# Patient Record
Sex: Male | Born: 1965
Health system: Southern US, Community
[De-identification: ages and names within clinical notes are randomized; demographics above are authoritative.]

## PROBLEM LIST (undated history)

## (undated) DIAGNOSIS — M109 Gout, unspecified: Secondary | ICD-10-CM

## (undated) DIAGNOSIS — E119 Type 2 diabetes mellitus without complications: Secondary | ICD-10-CM

## (undated) DIAGNOSIS — E785 Hyperlipidemia, unspecified: Secondary | ICD-10-CM

## (undated) DIAGNOSIS — I639 Cerebral infarction, unspecified: Secondary | ICD-10-CM

## (undated) HISTORY — DX: Type 2 diabetes mellitus without complications: E11.9

---

## 2011-05-24 ENCOUNTER — Ambulatory Visit: Payer: Self-pay | Admitting: Family Medicine

## 2013-05-29 ENCOUNTER — Emergency Department: Payer: Self-pay | Admitting: Emergency Medicine

## 2015-04-13 DIAGNOSIS — M109 Gout, unspecified: Secondary | ICD-10-CM

## 2015-04-13 HISTORY — DX: Gout, unspecified: M10.9

## 2015-04-25 DIAGNOSIS — I639 Cerebral infarction, unspecified: Secondary | ICD-10-CM

## 2015-04-25 HISTORY — DX: Cerebral infarction, unspecified: I63.9

## 2015-04-26 ENCOUNTER — Emergency Department (HOSPITAL_COMMUNITY): Payer: Medicaid Other

## 2015-04-26 ENCOUNTER — Inpatient Hospital Stay (HOSPITAL_COMMUNITY): Payer: Medicaid Other

## 2015-04-26 ENCOUNTER — Encounter (HOSPITAL_COMMUNITY): Payer: Self-pay

## 2015-04-26 ENCOUNTER — Inpatient Hospital Stay (HOSPITAL_COMMUNITY)
Admission: EM | Admit: 2015-04-26 | Discharge: 2015-05-01 | DRG: 041 | Disposition: A | Discharge status: Rehabilitation Facility | Payer: Medicaid Other | Attending: Family Medicine | Admitting: Family Medicine

## 2015-04-26 DIAGNOSIS — E785 Hyperlipidemia, unspecified: Secondary | ICD-10-CM | POA: Diagnosis present

## 2015-04-26 DIAGNOSIS — M109 Gout, unspecified: Secondary | ICD-10-CM | POA: Diagnosis present

## 2015-04-26 DIAGNOSIS — I69398 Other sequelae of cerebral infarction: Secondary | ICD-10-CM | POA: Insufficient documentation

## 2015-04-26 DIAGNOSIS — I639 Cerebral infarction, unspecified: Secondary | ICD-10-CM | POA: Diagnosis present

## 2015-04-26 DIAGNOSIS — I63312 Cerebral infarction due to thrombosis of left middle cerebral artery: Secondary | ICD-10-CM | POA: Diagnosis not present

## 2015-04-26 DIAGNOSIS — I69991 Dysphagia following unspecified cerebrovascular disease: Secondary | ICD-10-CM | POA: Diagnosis not present

## 2015-04-26 DIAGNOSIS — I63512 Cerebral infarction due to unspecified occlusion or stenosis of left middle cerebral artery: Principal | ICD-10-CM | POA: Diagnosis present

## 2015-04-26 DIAGNOSIS — Z720 Tobacco use: Secondary | ICD-10-CM | POA: Diagnosis not present

## 2015-04-26 DIAGNOSIS — I651 Occlusion and stenosis of basilar artery: Secondary | ICD-10-CM | POA: Diagnosis present

## 2015-04-26 DIAGNOSIS — R4701 Aphasia: Secondary | ICD-10-CM | POA: Diagnosis present

## 2015-04-26 DIAGNOSIS — IMO0002 Reserved for concepts with insufficient information to code with codable children: Secondary | ICD-10-CM | POA: Insufficient documentation

## 2015-04-26 DIAGNOSIS — G8191 Hemiplegia, unspecified affecting right dominant side: Secondary | ICD-10-CM | POA: Diagnosis present

## 2015-04-26 DIAGNOSIS — F1020 Alcohol dependence, uncomplicated: Secondary | ICD-10-CM | POA: Insufficient documentation

## 2015-04-26 DIAGNOSIS — F17200 Nicotine dependence, unspecified, uncomplicated: Secondary | ICD-10-CM | POA: Diagnosis present

## 2015-04-26 DIAGNOSIS — I69320 Aphasia following cerebral infarction: Secondary | ICD-10-CM | POA: Insufficient documentation

## 2015-04-26 DIAGNOSIS — M1A9XX0 Chronic gout, unspecified, without tophus (tophi): Secondary | ICD-10-CM | POA: Insufficient documentation

## 2015-04-26 DIAGNOSIS — R4182 Altered mental status, unspecified: Secondary | ICD-10-CM | POA: Diagnosis not present

## 2015-04-26 DIAGNOSIS — F1090 Alcohol use, unspecified, uncomplicated: Secondary | ICD-10-CM | POA: Insufficient documentation

## 2015-04-26 DIAGNOSIS — F1010 Alcohol abuse, uncomplicated: Secondary | ICD-10-CM | POA: Diagnosis present

## 2015-04-26 DIAGNOSIS — I69920 Aphasia following unspecified cerebrovascular disease: Secondary | ICD-10-CM | POA: Diagnosis not present

## 2015-04-26 DIAGNOSIS — I63412 Cerebral infarction due to embolism of left middle cerebral artery: Secondary | ICD-10-CM | POA: Diagnosis not present

## 2015-04-26 DIAGNOSIS — I10 Essential (primary) hypertension: Secondary | ICD-10-CM | POA: Diagnosis not present

## 2015-04-26 DIAGNOSIS — R269 Unspecified abnormalities of gait and mobility: Secondary | ICD-10-CM

## 2015-04-26 DIAGNOSIS — I69391 Dysphagia following cerebral infarction: Secondary | ICD-10-CM | POA: Insufficient documentation

## 2015-04-26 HISTORY — DX: Gout, unspecified: M10.9

## 2015-04-26 HISTORY — DX: Hyperlipidemia, unspecified: E78.5

## 2015-04-26 LAB — COMPREHENSIVE METABOLIC PANEL
ALBUMIN: 3.9 g/dL (ref 3.5–5.0)
ALK PHOS: 59 U/L (ref 38–126)
ALT: 25 U/L (ref 17–63)
AST: 27 U/L (ref 15–41)
Anion gap: 12 (ref 5–15)
BILIRUBIN TOTAL: 1.2 mg/dL (ref 0.3–1.2)
BUN: 9 mg/dL (ref 6–20)
CALCIUM: 9.4 mg/dL (ref 8.9–10.3)
CO2: 26 mmol/L (ref 22–32)
Chloride: 102 mmol/L (ref 101–111)
Creatinine, Ser: 0.75 mg/dL (ref 0.61–1.24)
GFR calc Af Amer: >60 mL/min (ref >60)
GFR calc non Af Amer: >60 mL/min (ref >60)
GLUCOSE: 98 mg/dL (ref 65–99)
Potassium: 4.9 mmol/L (ref 3.5–5.1)
SODIUM: 140 mmol/L (ref 135–145)
TOTAL PROTEIN: 6.7 g/dL (ref 6.5–8.1)

## 2015-04-26 LAB — PROTIME-INR
INR: 1.13 (ref 0.00–1.49)
Prothrombin Time: 14.7 seconds (ref 11.6–15.2)

## 2015-04-26 LAB — MRSA PCR SCREENING: MRSA BY PCR: NEGATIVE

## 2015-04-26 LAB — MAGNESIUM: Magnesium: 2.6 mg/dL — ABNORMAL HIGH (ref 1.7–2.4)

## 2015-04-26 LAB — DIFFERENTIAL
BASOS ABS: 0 10*3/uL (ref 0.0–0.1)
Basophils Relative: 0 %
Eosinophils Absolute: 0.1 10*3/uL (ref 0.0–0.7)
Eosinophils Relative: 1 %
LYMPHS ABS: 1.7 10*3/uL (ref 0.7–4.0)
LYMPHS PCT: 23 %
Monocytes Absolute: 1 10*3/uL (ref 0.1–1.0)
Monocytes Relative: 13 %
NEUTROS ABS: 4.7 10*3/uL (ref 1.7–7.7)
NEUTROS PCT: 63 %

## 2015-04-26 LAB — I-STAT CHEM 8, ED
BUN: 12 mg/dL (ref 6–20)
CREATININE: 0.7 mg/dL (ref 0.61–1.24)
Calcium, Ion: 1.13 mmol/L (ref 1.12–1.23)
Chloride: 101 mmol/L (ref 101–111)
Glucose, Bld: 91 mg/dL (ref 65–99)
HEMATOCRIT: 51 % (ref 39.0–52.0)
HEMOGLOBIN: 17.3 g/dL — AB (ref 13.0–17.0)
POTASSIUM: 5.4 mmol/L — AB (ref 3.5–5.1)
SODIUM: 138 mmol/L (ref 135–145)
TCO2: 31 mmol/L (ref 0–100)

## 2015-04-26 LAB — RAPID URINE DRUG SCREEN, HOSP PERFORMED
AMPHETAMINES: NOT DETECTED
Barbiturates: NOT DETECTED
Benzodiazepines: NOT DETECTED
COCAINE: NOT DETECTED
OPIATES: NOT DETECTED
Tetrahydrocannabinol: NOT DETECTED

## 2015-04-26 LAB — URINALYSIS, ROUTINE W REFLEX MICROSCOPIC
Bilirubin Urine: NEGATIVE
GLUCOSE, UA: NEGATIVE mg/dL
Hgb urine dipstick: NEGATIVE
Ketones, ur: 15 mg/dL — AB
LEUKOCYTES UA: NEGATIVE
NITRITE: NEGATIVE
PH: 7 (ref 5.0–8.0)
Protein, ur: NEGATIVE mg/dL
SPECIFIC GRAVITY, URINE: 1.007 (ref 1.005–1.030)

## 2015-04-26 LAB — CBC
HCT: 46.5 % (ref 39.0–52.0)
HEMOGLOBIN: 16.4 g/dL (ref 13.0–17.0)
MCH: 33 pg (ref 26.0–34.0)
MCHC: 35.3 g/dL (ref 30.0–36.0)
MCV: 93.6 fL (ref 78.0–100.0)
PLATELETS: 193 10*3/uL (ref 150–400)
RBC: 4.97 MIL/uL (ref 4.22–5.81)
RDW: 11.8 % (ref 11.5–15.5)
WBC: 7.5 10*3/uL (ref 4.0–10.5)

## 2015-04-26 LAB — ETHANOL

## 2015-04-26 LAB — I-STAT TROPONIN, ED: Troponin i, poc: 0 ng/mL (ref 0.00–0.08)

## 2015-04-26 LAB — APTT: APTT: 27 s (ref 24–37)

## 2015-04-26 MED ORDER — LORAZEPAM 2 MG/ML IJ SOLN: 2.0000 mg | INTRAMUSCULAR | Status: DC | PRN

## 2015-04-26 MED ORDER — NICOTINE 14 MG/24HR TD PT24
14.0000 mg | MEDICATED_PATCH | Freq: Every day | TRANSDERMAL | Status: DC
  Administered 2015-04-27 – 2015-04-28 (×2): 14 mg via TRANSDERMAL
  Filled 2015-04-26 (×3): qty 1

## 2015-04-26 MED ORDER — M.V.I. ADULT IV INJ
INJECTION | Freq: Once | INTRAVENOUS | Status: AC
  Administered 2015-04-26: 20:00:00 via INTRAVENOUS

## 2015-04-26 MED ORDER — ENOXAPARIN SODIUM 40 MG/0.4ML ~~LOC~~ SOLN: 40.0000 mg | SUBCUTANEOUS | Status: DC

## 2015-04-26 MED ORDER — IPRATROPIUM-ALBUTEROL 0.5-2.5 (3) MG/3ML IN SOLN: 3.0000 mL | Freq: Four times a day (QID) | RESPIRATORY_TRACT | Status: DC | PRN

## 2015-04-26 MED ORDER — SODIUM CHLORIDE 0.9 % IJ SOLN
3.0000 mL | Freq: Two times a day (BID) | INTRAMUSCULAR | Status: DC
  Administered 2015-04-26 – 2015-04-30 (×7): 3 mL via INTRAVENOUS

## 2015-04-26 MED ORDER — ASPIRIN 300 MG RE SUPP: 300.0000 mg | Freq: Every day | RECTAL | Status: DC

## 2015-04-26 MED ORDER — STROKE: EARLY STAGES OF RECOVERY BOOK
Freq: Once | Status: AC
  Administered 2015-04-27: 02:00:00
  Filled 2015-04-26: qty 1

## 2015-04-26 MED ORDER — IOHEXOL 350 MG/ML SOLN
80.0000 mL | Freq: Once | INTRAVENOUS | Status: AC | PRN
  Administered 2015-04-26: 80 mL via INTRAVENOUS

## 2015-04-26 MED ORDER — THIAMINE HCL 100 MG/ML IJ SOLN
Freq: Once | INTRAVENOUS | Status: DC
  Filled 2015-04-26: qty 1000

## 2015-04-26 MED ORDER — ASPIRIN 300 MG RE SUPP
300.0000 mg | Freq: Every day | RECTAL | Status: DC
  Administered 2015-04-26: 300 mg via RECTAL
  Filled 2015-04-26: qty 1

## 2015-04-26 MED ORDER — SENNOSIDES-DOCUSATE SODIUM 8.6-50 MG PO TABS: 1.0000 | ORAL_TABLET | Freq: Every evening | ORAL | Status: DC | PRN

## 2015-04-26 MED ORDER — ENOXAPARIN SODIUM 40 MG/0.4ML ~~LOC~~ SOLN
40.0000 mg | SUBCUTANEOUS | Status: DC
  Administered 2015-04-26 – 2015-04-30 (×5): 40 mg via SUBCUTANEOUS
  Filled 2015-04-26 (×5): qty 0.4

## 2015-04-26 MED ORDER — DEXTROSE-NACL 5-0.9 % IV SOLN
INTRAVENOUS | Status: DC
  Administered 2015-04-27: 01:00:00 via INTRAVENOUS

## 2015-04-26 MED ORDER — THIAMINE HCL 100 MG/ML IJ SOLN
Freq: Once | INTRAVENOUS | Status: DC
  Administered 2015-04-26: 18:00:00 via INTRAVENOUS
  Filled 2015-04-26 (×2): qty 1000

## 2015-04-26 NOTE — H&P (Signed)
[facility] History and Physical Service Pager: [phone]  Patient name: Gary Barker Medical record number: [id number] Date of birth: 07/08/1965 Age: 50 y.o. Gender: male  Primary Care Provider: No primary care provider on file. Consultants: neurology Code Status: full  Chief Complaint: aphasia and right sided weakness  Assessment and Plan: Gary Barker is a 50 y.o. male presenting with ischemic stroke. PMH is significant for heavy drinking, heavy smoking and questionable hypertension.  CVA in LMCA territory: Patient with receptive and expressive aphasia consistent with his CT finding. He was last seen normal at 17:30pm last night, 1/13. He is outside tpa window.  Exam is limited as patient had difficulty understanding and follow directions. Hand grips 4/5 on the right and 5/5 on the left. He can raise both legs. Knee reflex 2+. Babinski negative. No apparent facial drooping.  CT head with CT head with acute infarction involving the left insular ribbon and external capsule, and potential thrombus within the left middle cerebral artery. CTA suspicious for multifocal areas of nonhemorrhagic left MCA territory infarction. No sign of infection. UDS negative. EtOH level normal. EKG read as atrial flutter but NSR on further evaluation. Unclear source of stroke at this time. Some concern for multiple emboli breaking from a larger clot source given distribution of infarcts. He does have an extensive smoking history with limited medical follow up to evaluate for potential chronic conditions.  -Admit to step-down, Attending Dr. Jennette KettleNeal -Appreciate neuro recs. F/u further recs -MRI brain/ Echo -Allow permissive hypertension, BP to to 220/120. Will normalize BP over 5-7 days -Risk stratification labs (lipid panel, A1c, TSH) -SLP/PT/OT eval -NPO pending bed side swallow eval.   -Full dose ASA and Atorvastatin 40 mg when able to swallow safely, ASA per rectum for  now -Nurovascular check every 2 hours  Questionable H/o Hypertension: not clear on this. He sees Dr Zara Che[place]Larry [place]. Doesn't seem he is on any medication -allow permissive hypertension for now -Will call Dr. Gerilyn PilgrimSykes office on Monday  H/o asthma?: he is on combivent. Wife says he hasn't used the medication in awhile.  -Albuterol as needed.  Alcohol use disorder: history of heavy drinking per family members -CIWA -Recommend EtOH rehab on discharge  Tobacco use disorder: unclear how much he smokes but per family he ' is always outside smoking' -Nicotine patch 14 mg qD  Recent treatment for hand pain- Recently put on a steroid taper for hand pain [facility]. This was started on 1/7 to continue for 10 days, on 1/16. Patient and family are unable to say what his symptoms were or what he was diagnosed with. Exam is negative for rash or lesions on bilateral hands.  - Will hold steroids for now as he is NPO and has early completed steroid course - will call clinic where he was seen to inquire regarding further history  FEN/GI: -NPO pending bedside swallow test -mIVF, banana bag then D5NS -Protonix  Prophylaxis: lovenox  Disposition: stepdown pending neuro improvement and work up  History of Present Illness:  Gary Barker is a 50 y.o. male presenting with stroke. Patient is not able to provide history. History was gathered from his wife and step son.  Patient was in his usual state of health until yesterday 5:30 pm. He was cooking food. About 8 am he was noted crawling up stairs. Initially, they thought he was drunk. He usually does drink heavily. About 9 pm his wife noticed him sitting  on the floor next to their bed holding to the edge of his bed. He was able to pull himself in to the bed. His wife also  noticed right sided facial drooping. He wasn't talking well which was not normal for him according to his wife.  Patient walk up this morning. He is still  not able to talk. He was dragging his right leg when he tried to go to bathroom. Finally, they decided to call EMS.  Heavy use of EtOH use tobacco. History of marijuana use in the past. Patient was seen by Neuro while in ED.  In ED: BP 171/119. CBC and CMP within normal range, UDS negative, EtoH < 5, CT head with acute infarction involving the left insular ribbon and external capsule, and potential thrombus within the left middle cerebral artery. CTA suspicious for multifocal areas of nonhemorrhagic left MCA territory infarction.   Review Of Systems: Per HPI Family and patient deny recent illness, fever, chest pain, SOB, headache, otherwise the remainder of the systems were negative.  Patient Active Problem List   Diagnosis Date Noted  . CVA (cerebral infarction) 04/26/2015  . Cerebral infarction due to unspecified mechanism     Past Medical History: Treated for HTN approximately 7 years ago, but has not had treatment since  Past Surgical History: History reviewed. No pertinent past surgical history.  Social History: Social History  Substance Use Topics  . Smoking status: Never Smoker   . Smokeless tobacco: None  . Alcohol Use: Yes   Additional social history: history of marijuana use in the past. Please also refer to relevant sections of EMR.  Family History: History reviewed. No pertinent family history. Not able to obtain this at this time  Allergies and Medications: No Known Allergies  Objective: BP 178/116 mmHg  Pulse 80  Temp(Src) 98.7 F (37.1 C) (Oral)  Resp 18  SpO2 99% Exam: Gen: lying in bed, NAD. Eyes: pupils equal, round and reactive to light Oropharynx: clear, moist  Neck: supple, no LAD CV: regular rate and rythm. S1 & S2 audible, no murmurs. Resp: normal work of breathing, clear to auscultation bilaterally. GI: bowel sounds normal, no tenderness to palpation Skin: no lesions, normal bilateral hands negative for rash or bony abnormalities MSK:  normal bulk, symmetric Neuro: limited by patients difficulty to understand and follow directions. Aphasic, says yes to every questions including " Are you a Martian?",  no noticeable facial drooping, PERRL, hand grips 4/5 in right and 5/5 in left, moves both legs symmetrically, made tight fist with his right hand when asked to raise his left leg, knee reflex 2+ bilaterally, babinski negative bilaterally.    Labs and Imaging: CBC BMET   Recent Labs Lab 04/26/15 1203 04/26/15 1216  WBC 7.5  --   HGB 16.4 17.3*  HCT 46.5 51.0  PLT 193  --     Recent Labs Lab 04/26/15 1203 04/26/15 1216  NA 140 138  K 4.9 5.4*  CL 102 101  CO2 26  --   BUN 9 12  CREATININE 0.75 0.70  GLUCOSE 98 91  CALCIUM 9.4  --      Ct Head Wo Contrast  04/26/2015  1. Acute infarction involving the LEFT insular ribbon and external capsule. 2. Potential thrombus within the LEFT middle cerebral artery. Findings conveyed to: ZAMMIT, JOSEPHon 04/26/2015  at13:06. Electronically Signed   By: Genevive Bi M.D.   On: 04/26/2015 13:08   Ct Angio Neck W/cm &/or Wo/cm  04/26/2015  Technically  poor quality CTA head neck. The study is sufficiently diagnostic to exclude a flow reducing extracranial stenosis or intracranial large vessel occlusion. Multifocal areas of nonhemorrhagic LEFT MCA territory infarction are suspected. MRI brain and intracranial MRA is pending. Electronically Signed   By: Elsie Stain M.D.   On: 04/26/2015 15:32    Almon Hercules, MD 04/26/2015, 4:11 PM PGY-1, [facility] FPTS Intern pager: [phone], text pages welcome   I have seen and examined the patient. I have read and agree with the above note. My changes are noted in blue.  Tiffancy Moger A. Kennon Rounds MD, MS Family Medicine Resident PGY-2 Pager [phone]

## 2015-04-26 NOTE — ED Provider Notes (Signed)
CSN: [id number]     Arrival date & time 04/26/15  1138 History   First MD Initiated Contact with Patient 04/26/15 1140     Chief Complaint  Patient presents with  . Cerebrovascular Accident     (Consider location/radiation/quality/duration/timing/severity/associated sxs/prior Treatment) HPI  Gary Barker is a(n) 50 y.o. male who presents to the emergency department for weakness and a fascia. His last seen normal was 7 PM last night and he is out of the window for TPA administration. He has a past medical history of smoking, heavy alcohol use. His wife denies history of high blood pressure. Last night around 11 PM she noticed him crawling on the floor to the bed. She asked if he was okay and he said yes, and crawled into bed. Patient's wife fell asleep. When he awoke this morning she noticed he was dragging his right leg. He tried to drink some milk but then began having a facial droop and drooling. She also said that he cannot answer questions normally and was having difficulty saying words. He has no other significant past medical history.   History reviewed. No pertinent past medical history. History reviewed. No pertinent past surgical history. History reviewed. No pertinent family history. Social History  Substance Use Topics  . Smoking status: Never Smoker   . Smokeless tobacco: None  . Alcohol Use: Yes    Review of Systems  Unable to perform ROS: Mental status change      Allergies  Review of patient's allergies indicates no known allergies.  Home Medications   Prior to Admission medications   Not on File   BP 171/119 mmHg  Pulse 84  Temp(Src) 98.3 F (36.8 C) (Oral)  Resp 22  SpO2 99% Physical Exam  Constitutional: He appears well-developed and well-nourished. No distress.  HENT:  Head: Normocephalic and atraumatic.  Eyes: Conjunctivae are normal. No scleral icterus.  Neck: Normal range of motion. Neck supple.  Cardiovascular: Normal rate, regular rhythm and  normal heart sounds.   Pulmonary/Chest: Effort normal and breath sounds normal. No respiratory distress.  Abdominal: Soft. There is no tenderness.  Musculoskeletal: He exhibits no edema.  Neurological: He is alert. GCS eye subscore is 4. GCS verbal subscore is 5. GCS motor subscore is 6.  Patient with what appears to be a left sided gaze preference. The right arm is drawn upward. He has an expressive aphasia and also seems to have a receptive aphasia, unable to follow commands correctly. Unable to assess the majority of the neuro exam due to his inability to affectively participate. There is no noticeable facial droop. PERRL  Skin: Skin is warm and dry. He is not diaphoretic.  Psychiatric: His behavior is normal.  Nursing note and vitals reviewed.   ED Course  Procedures (including critical care time) Labs Review Labs Reviewed  ETHANOL  PROTIME-INR  APTT  CBC  DIFFERENTIAL  COMPREHENSIVE METABOLIC PANEL  URINE RAPID DRUG SCREEN, HOSP PERFORMED  URINALYSIS, ROUTINE W REFLEX MICROSCOPIC (NOT AT [facility])  MAGNESIUM  I-STAT CHEM 8, ED  I-STAT TROPOININ, ED    Imaging Review No results found. I have personally reviewed and evaluated these images and lab results as part of my medical decision-making.   EKG Interpretation   Date/Time:  Saturday April 26 2015 11:41:11 EST Ventricular Rate:  77 PR Interval:    QRS Duration: 89 QT Interval:  355 QTC Calculation: 402 R Axis:   55 Text Interpretation:  Atrial flutter with predominant 4:1 AV block  Confirmed by  ZAMMIT  MD, Jomarie LongsJOSEPH [phone] on 04/26/2015 11:55:26 AM      MDM   Final diagnoses:  Cerebral infarction due to unspecified mechanism    11:45 AM Patient with significant neurologic deficits. Very likely patient has had a stroke. I have ordered a banana bag on the patient as I have suspicion for high alcohol use, and question if there is some component of Korsakoff's/Wernicke's encephalopathy. The patient is stable and  protecting his airway. He has a GCS of 15. I have spoken wit Dr. Amada JupiterKirkpatrick who will consult on the patient.   Patient CT scan positive for acute left MCA infarct. I spoken with Dr. Amada JupiterKirkpatrick, who is seeing the patient. He is put in an order for CT angiogram. MRI discontinued. The patient will be admitted by family medicine. He will move to the floor after CTA is completed. He is stable throughout visit here.   pateitne with    Arthor CaptainAbigail Esiquio Boesen, PA-C 04/26/15 1800  Bethann BerkshireJoseph Zammit, MD 04/29/15 [phone]

## 2015-04-26 NOTE — ED Notes (Signed)
Pt back from CT, admitting MD at bedside.

## 2015-04-26 NOTE — Consult Note (Addendum)
Neurology Consultation Reason for Consult: Right-sided weakness Referring Physician: Clelia SchaumannZammit, J  CC: Right-sided weakness  History is obtained from: Family  HPI: Gary LayerJuan Barker is a 50 y.o. male who was last seen in his normal state around 5 or 6 PM last night. He was then seen to be abnormal around 9 PM. His wife works third shift, this morning when his wife saw him around 7 AM, he was asking inappropriate questions and dragging his right foot. She therefore brought him into the emergency room where CT head was performed which shows patchy left MCA territory infarct.   Of note, he states that he gets inflamed joints from time to time, and recently had inflamed bilateral thumb joints and is on prednisone for what I presume is gout.  LKW: 9 PM tpa given?: no, out of window    ROS: A 14 point ROS was performed and is negative except as noted in the HPI.   Past medical history: I suspect Gout  Family medical history: No hx stroke   Social History:  reports that he has never smoked. He does not have any smokeless tobacco history on file. He reports that he drinks alcohol. He reports that he does not use illicit drugs.   Exam: Current vital signs: BP 171/119 mmHg  Pulse 84  Temp(Src) 98.3 F (36.8 C) (Oral)  Resp 22  SpO2 99% Vital signs in last 24 hours: Temp:  [98.3 F (36.8 C)] 98.3 F (36.8 C) (01/14 1140) Pulse Rate:  [84] 84 (01/14 1140) Resp:  [22] 22 (01/14 1140) BP: (171)/(119) 171/119 mmHg (01/14 1140) SpO2:  [99 %] 99 % (01/14 1140)   Physical Exam  Constitutional: Appears well-developed and well-nourished.  Psych: Affect appropriate to situation Eyes: No scleral injection HENT: No OP obstrucion Head: Normocephalic.  Cardiovascular: Normal rate and regular rhythm.  Respiratory: Effort normal and breath sounds normal to anterior ascultation GI: Soft.  No distension. There is no tenderness.  Skin: WDI  Neuro: Mental Status: Patient is awake, alert,  oriented to person, place, month, year, and situation. Patient is able to give a clear and coherent history. He has a moderate - severe aphasia. He is able to answer some questions with gestures and fragments, though not always appropriately. Follows commands incompletely(e.g. Shows hand when asked to show thumb) Cranial Nerves: II:R hemianopia.  Pupils are equal, round, and reactive to light.   III,IV, VI: EOMI without ptosis or diploplia.  V: Facial sensation is symmetric to temperature VII: Facial movement is symmetric.  VIII: hearing is intact to voice X: Uvula elevates symmetrically XI: Shoulder shrug is symmetric. XII: tongue is midline without atrophy or fasciculations.  Motor: Tone is normal. Bulk is normal. He does not cooperate fully, but to the extent that he did cooperate and was not able to appreciate any weakness, he does not drift. Sensory: Sensation is symmetric to pinprick Cerebellar: No clear ataxia   I have reviewed labs in epic and the results pertinent to this consultation are: Normal creatinine  I have reviewed the images obtained: CT head-patchy infarct in the posterior left MCA distribution  Impression: 50 year old male with patchy left MCA infarct. I suspect that he had an embolus which broke up, though proximal large vessel occlusion is possible as well. He does a smoker, but he will need a full workup for stroke etiology.  Recommendations: 1. HgbA1c, fasting lipid panel 2. MRI of the brain without contrast 3. Frequent neuro checks 4. Echocardiogram 5. CT angiogram of the  head and neck 6. Prophylactic therapy-Antiplatelet med: Aspirin - dose 325mg  PO or 300mg  PR 7. Risk factor modification 8. Telemetry monitoring 9. PT consult, OT consult, Speech consult 10. Permissive hypertension up to 220/120  Ritta Slot, MD [facility] [phone]  If 7pm- 7am, please page neurology on call as listed in AMION.

## 2015-04-26 NOTE — ED Notes (Signed)
To room via EMS.  Pt wife at bedside.  Wife reports her children reported pt was normal at 7pm last night.  Pts wife was in bed woke up at 11am and pt was crawling to bed and when she asked if he was OK he said "yes" and went to sleep.  The wife woke up at 7am and pts conversation was inappropriate to questions asked and pt was dragging right foot.  Pt drank milk this morning and peeled 4 potatoes.  Pt did drank clear licquor 1/2 bottle (not sure size) and 20 oz beer.

## 2015-04-27 ENCOUNTER — Inpatient Hospital Stay (HOSPITAL_COMMUNITY): Payer: Medicaid Other

## 2015-04-27 DIAGNOSIS — I639 Cerebral infarction, unspecified: Secondary | ICD-10-CM

## 2015-04-27 DIAGNOSIS — F1020 Alcohol dependence, uncomplicated: Secondary | ICD-10-CM | POA: Insufficient documentation

## 2015-04-27 DIAGNOSIS — I63512 Cerebral infarction due to unspecified occlusion or stenosis of left middle cerebral artery: Principal | ICD-10-CM

## 2015-04-27 DIAGNOSIS — R4182 Altered mental status, unspecified: Secondary | ICD-10-CM | POA: Insufficient documentation

## 2015-04-27 DIAGNOSIS — I10 Essential (primary) hypertension: Secondary | ICD-10-CM | POA: Insufficient documentation

## 2015-04-27 DIAGNOSIS — Z720 Tobacco use: Secondary | ICD-10-CM | POA: Insufficient documentation

## 2015-04-27 LAB — BASIC METABOLIC PANEL
Anion gap: 10 (ref 5–15)
BUN: 8 mg/dL (ref 6–20)
CALCIUM: 8.5 mg/dL — AB (ref 8.9–10.3)
CHLORIDE: 106 mmol/L (ref 101–111)
CO2: 22 mmol/L (ref 22–32)
CREATININE: 0.68 mg/dL (ref 0.61–1.24)
GFR calc Af Amer: >60 mL/min (ref >60)
GLUCOSE: 96 mg/dL (ref 65–99)
Potassium: 4 mmol/L (ref 3.5–5.1)
SODIUM: 138 mmol/L (ref 135–145)

## 2015-04-27 LAB — LIPID PANEL
CHOLESTEROL: 206 mg/dL — AB (ref 0–200)
HDL: 83 mg/dL (ref >40)
LDL Cholesterol: 102 mg/dL — ABNORMAL HIGH (ref 0–99)
TRIGLYCERIDES: 105 mg/dL (ref <150)
Total CHOL/HDL Ratio: 2.5 RATIO
VLDL: 21 mg/dL (ref 0–40)

## 2015-04-27 LAB — CBC
HEMATOCRIT: 45.2 % (ref 39.0–52.0)
Hemoglobin: 15.8 g/dL (ref 13.0–17.0)
MCH: 32.9 pg (ref 26.0–34.0)
MCHC: 35 g/dL (ref 30.0–36.0)
MCV: 94.2 fL (ref 78.0–100.0)
PLATELETS: 206 10*3/uL (ref 150–400)
RBC: 4.8 MIL/uL (ref 4.22–5.81)
RDW: 11.9 % (ref 11.5–15.5)
WBC: 7.8 10*3/uL (ref 4.0–10.5)

## 2015-04-27 LAB — FOLATE: FOLATE: 69.7 ng/mL (ref >5.9)

## 2015-04-27 LAB — GLUCOSE, CAPILLARY: Glucose-Capillary: 89 mg/dL (ref 65–99)

## 2015-04-27 LAB — VITAMIN B12: Vitamin B-12: 330 pg/mL (ref 180–914)

## 2015-04-27 LAB — TSH: TSH: 0.788 u[IU]/mL (ref 0.350–4.500)

## 2015-04-27 MED ORDER — THIAMINE HCL 100 MG/ML IJ SOLN: 250.0000 mg | INTRAMUSCULAR | Status: DC

## 2015-04-27 MED ORDER — ASPIRIN 325 MG PO TABS
325.0000 mg | ORAL_TABLET | Freq: Every day | ORAL | Status: DC
  Administered 2015-04-27 – 2015-04-28 (×2): 325 mg via ORAL
  Filled 2015-04-27 (×3): qty 1

## 2015-04-27 MED ORDER — VITAMIN B-1 100 MG PO TABS
100.0000 mg | ORAL_TABLET | Freq: Every day | ORAL | Status: DC
  Administered 2015-04-27 – 2015-04-28 (×2): 100 mg via ORAL
  Filled 2015-04-27 (×2): qty 1

## 2015-04-27 MED ORDER — SODIUM CHLORIDE 0.9 % IV SOLN
INTRAVENOUS | Status: DC
  Administered 2015-04-27: 50 mL via INTRAVENOUS
  Administered 2015-04-28 – 2015-04-30 (×4): via INTRAVENOUS

## 2015-04-27 MED ORDER — THIAMINE HCL 100 MG/ML IJ SOLN: 500.0000 mg | Freq: Three times a day (TID) | INTRAMUSCULAR | Status: DC

## 2015-04-27 MED ORDER — PROSIGHT PO TABS
1.0000 | ORAL_TABLET | Freq: Every day | ORAL | Status: DC
Start: 2015-04-27 — End: 2015-05-01
  Administered 2015-04-27 – 2015-05-01 (×4): 1 via ORAL
  Filled 2015-04-27 (×7): qty 1

## 2015-04-27 MED ORDER — ATORVASTATIN CALCIUM 40 MG PO TABS
40.0000 mg | ORAL_TABLET | Freq: Every day | ORAL | Status: DC
  Administered 2015-04-27 – 2015-05-01 (×5): 40 mg via ORAL
  Filled 2015-04-27 (×5): qty 1

## 2015-04-27 MED ORDER — THIAMINE HCL 100 MG/ML IJ SOLN: 100.0000 mg | Freq: Every day | INTRAMUSCULAR | Status: DC

## 2015-04-27 MED ORDER — ACETAMINOPHEN 325 MG PO TABS
650.0000 mg | ORAL_TABLET | Freq: Four times a day (QID) | ORAL | Status: DC | PRN
  Administered 2015-04-27 – 2015-05-01 (×11): 650 mg via ORAL
  Filled 2015-04-27 (×12): qty 2

## 2015-04-27 NOTE — Progress Notes (Addendum)
CTSP for concern by stroke nurse that patient appears different from what was documented on Dr Alene MiresKirkpatrick's examination, This was the first time this nurse had seen the patient but she was concerned that he was different from the baseline he presented with. She was concerned because he was not speaking in full sentences to her or following commands. While she felt this was new,  much of what she described to us had been ellicited on our exam.   Went to assess patient for this concern. He was initially asleep but easily aroused. Upon waking he, as before, nodded to the affirmative to all questions. When he was asked to speak he said, " OK". When asked to say "Yes", he did so but with mild slurring. When seen earlier this evening he was able to only nod or say yes to the affirmative to all questioning.  Nursing had already paged Neurology who for concern or potential worsening of stroke, ordered a stat CT head and was on his way when I reached him.   BP 178/94 mmHg  Pulse 89  Temp(Src) 98.2 F (36.8 C) (Oral)  Resp 18  Ht 5\' 9"  (1.753 m)  Wt 134 lb 7.7 oz (61 kg)  BMI 19.85 kg/m2  SpO2 100%  A/P 50 y/o with Left MCA infarction, with concern for worsening stroke, proceeding with CT head - will follow up CT head results - continue to follow along with neurology   Lakeyia Surber A. Kennon RoundsHaney MD, MS Family Medicine Resident PGY-2 Pager [phone]  ///   CT head showed possible enlargement of the stroke, but after speaking with Dr Wyatt PortelaSomner from neurology he felt this was stable and expected, with no additional intervention needed  - Will continue to follow closely   Hanan Moen A. Kennon RoundsHaney MD, MS Family Medicine Resident PGY-2 Pager [phone]

## 2015-04-27 NOTE — Progress Notes (Signed)
Patient alert, f/c, will not stick tongue out, more verbal with ongoing on/off answers of yes to questions

## 2015-04-27 NOTE — Progress Notes (Signed)
  Echocardiogram 2D Echocardiogram has been performed.  Gary Barker, Gary Barker 04/27/2015, 3:14 PM

## 2015-04-27 NOTE — Progress Notes (Signed)
Lab department in patients room for AM labs. Patient appears to be very irritated and frustrated , patient pulling covers over his head, patient's wife attempting to assist with holding patients arm still while drawing labs. Rapid Response RN in the room at this time witnessing patient clearly raising left arm covering his head with blanket. After labs drawn I the RN apologized to the patient for having to wake him up frequently for neuro checks and patient clearly said " I Know".   Continue to monitor closely

## 2015-04-27 NOTE — Progress Notes (Signed)
Called Rapid Response RN to come back up for reevaluation. Patient continues to be difficult to arouse, has puzzled look upon face. Will not squeeze hands, will only rotate right hand around. I raised his arms with no drift noted. Is nonverbal. Rapid Response made aware of situation.

## 2015-04-27 NOTE — Evaluation (Signed)
Clinical/Bedside Swallow Evaluation Patient Details  Name: Gary Barker MRN: [id number] Date of Birth: 10/29/1965  Today's Date: 04/27/2015 Time: SLP Start Time (ACUTE ONLY): 0810 SLP Stop Time (ACUTE ONLY): 0830 SLP Time Calculation (min) (ACUTE ONLY): 20 min  Past Medical History: History reviewed. No pertinent past medical history. Past Surgical History: History reviewed. No pertinent past surgical history. HPI:  Gary Barker is a 50 y.o. male who was last seen in his normal state around 5 or 6 PM last night. He was then seen to be abnormal around 9 PM. His wife works third shift, this morning when his wife saw him around 7 AM, he was asking inappropriate questions and dragging his right foot. She therefore brought him into the emergency room where CT head was performed which shows patchy left MCA territory infarct. Function has been waxing and waning, though pt passed RN stroke swallow SLP ordered to confirm tolerance. Pt is primary [place] speaking.    Assessment / Plan / Recommendation Clinical Impression  Pt demosntrates intermittent right facial weakness Present initially with volitional smile, not seen with second volitional smile, with no functional impact on ablity to eat and drink. Pt swallowed more than 3 oz consecutively of water x2 with no overt signs of aspriation. Only difficulty was pt reported pain with mastication on the left. Will initiate a soft diet with thin liquids and f/u tomorrow for trials of hard solids for upgraded diet and Cognitive linguistic eval.     Aspiration Risk  Mild aspiration risk    Diet Recommendation Dysphagia 3 (Mech soft);Thin liquid   Liquid Administration via: Cup;Straw Medication Administration: Whole meds with liquid Supervision: Patient able to self feed Postural Changes: Seated upright at 90 degrees    Other  Recommendations     Follow up Recommendations       Frequency and Duration min 2x/week  2 weeks       Prognosis Prognosis for  Safe Diet Advancement: Good      Swallow Study   General HPI: Gary Barker is a 50 y.o. male who was last seen in his normal state around 5 or 6 PM last night. He was then seen to be abnormal around 9 PM. His wife works third shift, this morning when his wife saw him around 7 AM, he was asking inappropriate questions and dragging his right foot. She therefore brought him into the emergency room where CT head was performed which shows patchy left MCA territory infarct. Function has been waxing and waning, though pt passed RN stroke swallow SLP ordered to confirm tolerance. Pt is primary [place] speaking.  Type of Study: Bedside Swallow Evaluation Previous Swallow Assessment: none Diet Prior to this Study: NPO Temperature Spikes Noted: No History of Recent Intubation: No Behavior/Cognition: Alert;Cooperative;Pleasant mood Oral Care Completed by SLP: No Oral Cavity - Dentition: Adequate natural dentition Vision: Functional for self-feeding Self-Feeding Abilities: Able to feed self;Needs assist Patient Positioning: Upright in bed Baseline Vocal Quality: Normal Volitional Cough: Strong Volitional Swallow: Able to elicit    Oral/Motor/Sensory Function Overall Oral Motor/Sensory Function: Moderate impairment Facial ROM: Reduced right;Suspected CN VII (facial) dysfunction Facial Symmetry: Abnormal symmetry right;Suspected CN VII (facial) dysfunction Facial Strength: Reduced right;Suspected CN VII (facial) dysfunction Facial Sensation:  (pt denies different sensation, but language impaired. ) Lingual ROM: Within Functional Limits Lingual Symmetry: Within Functional Limits Lingual Strength: Within Functional Limits Lingual Sensation: Within Functional Limits Velum: Within Functional Limits Mandible: Within Functional Limits   Ice Chips Ice chips: Not tested  Thin Liquid Thin Liquid: Within functional limits    Nectar Thick Nectar Thick Liquid: Not tested   Honey Thick Honey Thick Liquid:  Not tested   Puree Puree: Within functional limits   Solid   GO   Solid: Impaired Oral Phase Impairments: Impaired mastication (pain) Oral Phase Functional Implications: Impaired mastication Pharyngeal Phase Impairments: Cough - Immediate       Harlon DittyBonnie Donella Pascarella, MA CCC-SLP [phone]  Claudine MoutonDeBlois, Aaryn Sermon Caroline 04/27/2015,8:33 AM

## 2015-04-27 NOTE — Progress Notes (Signed)
Family Medicine Teaching Service Daily Progress Note Intern Pager: [phone]  Patient name: Gary Barker Medical record number: [id number] Date of birth: 10/01/1965 Age: 50 y.o. Gender: male  Primary Care Provider: No primary care provider on file. Consultants: Neuro Code Status: full  Pt Overview and Major Events to Date:  1/14: L MCA CVA 1/15: Awaiting Echo, PT/OT eval  Assessment and Plan: Gary LayerJuan Beckel is a 50 y.o. male presenting with ischemic stroke. PMH is significant for heavy drinking, heavy smoking and questionable hypertension.  CVA in LMCA territory: Patient with receptive and expressive aphasia consistent with his CT finding. He was last seen normal at 17:30pm last night, 1/13. He was outside tpa window. CT head with CT head with acute infarction involving the left insular ribbon and external capsule, and potential thrombus within the left middle cerebral artery. CTA suspicious for multifocal areas of nonhemorrhagic left MCA territory infarction. No sign of infection. UDS negative. EtOH level normal. EKG read as atrial flutter but NSR on further evaluation. Unclear source of stroke at this time. Some concern for multiple emboli breaking from a larger clot source given distribution of infarcts. He does have an extensive smoking history with limited medical follow up to evaluate for potential chronic conditions. MRI: Extensive areas of acute nonhemorrhagic infarction throughout much of the LEFT MCA territory - Appreciate neuro recs. - Echo: Ordered - Allow permissive hypertension - Risk stratification labs (lipid panel, A1c, TSH) - SLP/PT/OT eval - Full dose ASA and Atorvastatin 40 mg   Questionable H/o Hypertension: not clear on this. He sees Dr Zara ChessLarry [place] in [place]. Doesn't seem he is on any medication -allow permissive hypertension for now BP  to 220/120. Will normalize BP over 5-7 days - Consider calling Dr. Gerilyn PilgrimSykes office on Monday  H/o asthma?: he is on combivent. Wife says he  hasn't used the medication in awhile.  -Albuterol as needed.  Alcohol use disorder: history of heavy drinking per family members -CIWA. Scores = < 2 last 24 hours. No Ativan given -Recommend EtOH rehab on discharge - Checking B12 and Folate - Thiamine started 04/27/15: 500mg  TID x 2 days, then 250mg  qd x 5 days  Tobacco use disorder: unclear how much he smokes but per family he ' is always outside smoking' -Nicotine patch 14 mg qD  Recent treatment for hand pain- Recently put on a steroid taper for hand pain at a [facility] likely due to Gout. This was started on 1/7 to continue for 10 days, on 1/16. Patient and family are unable to say what his symptoms were or what he was diagnosed with. Exam is negative for rash or lesions on bilateral hands.  - Will hold steroids for now; has early completed steroid course  FEN/GI: Dys 3 Diet with thin liquids. NS @ 50 cc/hr. Protonix Prophylaxis: lovenox   Disposition: Pending PT/OT evaluation  Subjective:  Difficult to understand due to aphasia. Reports feeling "better" but complains of left face numbness. Says he want to go home.   Objective: Temp:  [97.9 F (36.6 C)-98.7 F (37.1 C)] 98 F (36.7 C) (01/15 0341) Pulse Rate:  [70-89] 77 (01/15 0800) Resp:  [11-26] 20 (01/15 0800) BP: (135-190)/(84-119) 171/104 mmHg (01/15 0800) SpO2:  [96 %-100 %] 100 % (01/15 0800) Weight:  [134 lb 7.7 oz (61 kg)] 134 lb 7.7 oz (61 kg) (01/14 2008) Physical Exam: Gen: lying in bed, NAD. Eyes: pupils equal, round and reactive to light Oropharynx: clear, moist  Neck: supple,  no LAD CV: regular rate and rythm. S1 & S2 audible, no murmurs. Resp: normal work of breathing, clear to auscultation bilaterally. GI: bowel sounds normal, no tenderness to palpation Skin: no lesions, normal bilateral hands negative for rash or bony abnormalities MSK: normal bulk, symmetric Neuro: limited by patients difficulty to understand and follow  directions. Responds inappropriately to questions. No noticeable facial drooping, PERRL, hand grips 4/5 in right and 5/5 in left, moves both legs symmetrically,   Laboratory:  Recent Labs Lab 04/26/15 1203 04/26/15 1216 04/27/15 0300  WBC 7.5  --  7.8  HGB 16.4 17.3* 15.8  HCT 46.5 51.0 45.2  PLT 193  --  206    Recent Labs Lab 04/26/15 1203 04/26/15 1216 04/27/15 0300  NA 140 138 138  K 4.9 5.4* 4.0  CL 102 101 106  CO2 26  --  22  BUN 9 12 8   CREATININE 0.75 0.70 0.68  CALCIUM 9.4  --  8.5*  PROT 6.7  --   --   BILITOT 1.2  --   --   ALKPHOS 59  --   --   ALT 25  --   --   AST 27  --   --   GLUCOSE 98 91 96   Lipid Panel     Component Value Date/Time   CHOL 206* 04/27/2015 0300   TRIG 105 04/27/2015 0300   HDL 83 04/27/2015 0300   CHOLHDL 2.5 04/27/2015 0300   VLDL 21 04/27/2015 0300   LDLCALC 102* 04/27/2015 0300   Imaging/Diagnostic Tests: Ct Angio Head W/cm &/or Wo Cm  04/26/2015  CLINICAL DATA:  RIGHT-sided weakness.  Unknown last seen normal. EXAM: CT ANGIOGRAPHY HEAD AND NECK TECHNIQUE: Multidetector CT imaging of the head and neck was performed using the standard protocol during bolus administration of intravenous contrast. Multiplanar CT image reconstructions and MIPs were obtained to evaluate the vascular anatomy. Carotid stenosis measurements (when applicable) are obtained utilizing NASCET criteria, using the distal internal carotid diameter as the denominator. CONTRAST:  80mL OMNIPAQUE IOHEXOL 350 MG/ML SOLN COMPARISON:  Noncontrast CT head 04/26/2015. FINDINGS: The study was marred by technical difficulties. For the first injection, the wrong vessel was chosen for triggering (pulmonary artery instead of descending thoracic aorta). For the second run, the tubing became disconnected and an unknown amount of contrast spilled on the floor. Instead of performing a third injection, MRA intracranial will be performed to correlate with the findings described below.  CT HEAD Calvarium and skull base: No fracture or destructive lesion. Mastoids and middle ears are grossly clear. Paranasal sinuses: Imaged portions are clear. Orbits: Negative. Brain: Patchy areas of hypoattenuation throughout the LEFT hemisphere, including the insula, frontal, temporal, and parietal lobes and adjacent subcortical white matter, nonhemorrhagic. No mass lesion or midline shift. No hydrocephalus or extra-axial fluid. CTA NECK Aortic arch: Standard branching. Imaged portion shows no evidence of aneurysm or dissection. No significant stenosis of the major arch vessel origins. Moderate atheromatous calcification is non stenotic. Right carotid system: Calcific plaque without significant narrowing of the lumen. No ulceration. No evidence of dissection, stenosis (50% or greater) or occlusion. Left carotid system: Calcific plaque without significant narrowing of the lumen. No ulceration. No evidence of dissection, stenosis (50% or greater) or occlusion. Vertebral arteries: Codominant. No evidence of dissection, stenosis (50% or greater) or occlusion. Ostial plaque bilaterally. CTA HEAD Anterior circulation: Calcification in the carotid siphons, and supraclinoid segments, greater on the LEFT, not clearly flow reducing. No M1 stenosis on  the RIGHT and LEFT. No significant stenosis, proximal occlusion, aneurysm, or vascular malformation. 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 No significant stenosis, proximal occlusion, aneurysm, or vascular malformation. Venous sinuses: As permitted by contrast timing, patent. Anatomic variants: BILATERAL fetal PCA. Delayed phase:   No abnormal intracranial enhancement. IMPRESSION: Technically poor quality CTA head neck. The study is sufficiently diagnostic to exclude a flow reducing extracranial stenosis or intracranial large vessel occlusion. Multifocal areas of nonhemorrhagic LEFT MCA territory infarction are suspected. MRI brain and  intracranial MRA is pending. Electronically Signed   By: Elsie Stain M.D.   On: 04/26/2015 15:32   Ct Head Wo Contrast  04/27/2015  CLINICAL DATA:  50 year old male with altered mental status EXAM: CT HEAD WITHOUT CONTRAST TECHNIQUE: Contiguous axial images were obtained from the base of the skull through the vertex without intravenous contrast. COMPARISON:  Brain CT and MRI dated 04/26/2015 FINDINGS: There are areas of cortical and subcortical hypodensity involving the left frontal, temporal, and parietal lobe in the left MCA territory distribution compatible with ischemic infarct. There has been interval increase and conspicuity of these hypodensities compared the prior CT compatible with edema. There is no acute intracranial hemorrhage. Ventricles and sulci are appropriate in size for patient's age. There is no significant mass effect. No midline shift. The visualized paranasal sinuses and mastoid air cells are well aerated. The calvarium is intact. IMPRESSION: No acute intracranial hemorrhage. Large left MCA territory ischemic infarct. Electronically Signed   By: Elgie Collard M.D.   On: 04/27/2015 01:07   Ct Head Wo Contrast  04/26/2015  CLINICAL DATA:  Weakness, facial droop and aphasia EXAM: CT HEAD WITHOUT CONTRAST TECHNIQUE: Contiguous axial images were obtained from the base of the skull through the vertex without intravenous contrast. COMPARISON:  None. FINDINGS: There is low-attenuation within the LEFT insular ribbon and external capsule (image 14, series 2) LEFT middle cerebral artery is prominent with high-density (image 10 and 11 of series 2) which is less specific. There is no intracranial hemorrhage. No midline shift or mass effect. Paranasal sinuses and  mastoid air cells are clear. IMPRESSION: 1. Acute infarction involving the LEFT insular ribbon and external capsule. 2. Potential thrombus within the LEFT middle cerebral artery. Findings conveyed to: ZAMMIT, JOSEPHon 04/26/2015  at13:06.  Electronically Signed   By: Genevive Bi M.D.   On: 04/26/2015 13:08   Ct Angio Neck W/cm &/or Wo/cm  04/26/2015  CLINICAL DATA:  RIGHT-sided weakness.  Unknown last seen normal. EXAM: CT ANGIOGRAPHY HEAD AND NECK TECHNIQUE: Multidetector CT imaging of the head and neck was performed using the standard protocol during bolus administration of intravenous contrast. Multiplanar CT image reconstructions and MIPs were obtained to evaluate the vascular anatomy. Carotid stenosis measurements (when applicable) are obtained utilizing NASCET criteria, using the distal internal carotid diameter as the denominator. CONTRAST:  80mL OMNIPAQUE IOHEXOL 350 MG/ML SOLN COMPARISON:  Noncontrast CT head 04/26/2015. FINDINGS: The study was marred by technical difficulties. For the first injection, the wrong vessel was chosen for triggering (pulmonary artery instead of descending thoracic aorta). For the second run, the tubing became disconnected and an unknown amount of contrast spilled on the floor. Instead of performing a third injection, MRA intracranial will be performed to correlate with the findings described below. CT HEAD Calvarium and skull base: No fracture or destructive lesion. Mastoids and middle ears are grossly clear. Paranasal sinuses: Imaged portions are clear. Orbits: Negative. Brain: Patchy areas of hypoattenuation throughout the LEFT hemisphere,  including the insula, frontal, temporal, and parietal lobes and adjacent subcortical white matter, nonhemorrhagic. No mass lesion or midline shift. No hydrocephalus or extra-axial fluid. CTA NECK Aortic arch: Standard branching. Imaged portion shows no evidence of aneurysm or dissection. No significant stenosis of the major arch vessel origins. Moderate atheromatous calcification is non stenotic. Right carotid system: Calcific plaque without significant narrowing of the lumen. No ulceration. No evidence of dissection, stenosis (50% or greater) or occlusion. Left  carotid system: Calcific plaque without significant narrowing of the lumen. No ulceration. No evidence of dissection, stenosis (50% or greater) or occlusion. Vertebral arteries: Codominant. No evidence of dissection, stenosis (50% or greater) or occlusion. Ostial plaque bilaterally. CTA HEAD Anterior circulation: Calcification in the carotid siphons, and supraclinoid segments, greater on the LEFT, not clearly flow reducing. No M1 stenosis on the RIGHT and LEFT. No significant stenosis, proximal occlusion, aneurysm, or vascular malformation. 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 No significant stenosis, proximal occlusion, aneurysm, or vascular malformation. Venous sinuses: As permitted by contrast timing, patent. Anatomic variants: BILATERAL fetal PCA. Delayed phase:   No abnormal intracranial enhancement. IMPRESSION: Technically poor quality CTA head neck. The study is sufficiently diagnostic to exclude a flow reducing extracranial stenosis or intracranial large vessel occlusion. Multifocal areas of nonhemorrhagic LEFT MCA territory infarction are suspected. MRI brain and intracranial MRA is pending. Electronically Signed   By: Elsie Stain M.D.   On: 04/26/2015 15:32   Mr Maxine Glenn Head Wo Contrast  04/26/2015  CLINICAL DATA:  RIGHT-sided weakness.  Unknown last seen normal. EXAM: MRI HEAD WITHOUT CONTRAST MRA HEAD WITHOUT CONTRAST TECHNIQUE: Multiplanar, multiecho pulse sequences of the brain and surrounding structures were obtained without intravenous contrast. Angiographic images of the head were obtained using MRA technique without contrast. COMPARISON:  CT head, CT angio head neck, earlier today. FINDINGS: MRI HEAD FINDINGS The patient was unable to remain motionless for the exam. Small or subtle lesions could be overlooked. Extensive gyriform restricted diffusion throughout much of the LEFT MCA territory, including frontal, temporal, insular, and parietal lobes, as well  as adjacent subcortical white matter. No definite hemorrhagic transformation. Generalized atrophy premature for age. Minor white matter disease, nonspecific. No midline abnormalities. Flow voids are maintained. No extracranial soft tissue abnormalities of significance. MRA HEAD FINDINGS The internal carotid arteries are widely patent. The RIGHT anterior cerebral artery is severely diseased, and both anterior cerebral is are predominantly supplied from the LEFT. The RIGHT MCA trunk and branches are normal. On the LEFT, distal M1 segment, there appears to be a flow reducing lesion. Patient motion degrades the axial source images. It is not clear from interrogation of those axial source images, as well as correlation with CTA, if this represents a severe stenosis or intraluminal filling defect. See for instance image 14 series 501, as well as image 98, series 5. Decreased size and number of distal LEFT MCA branches; the distal M1 lesion is nonocclusive. The basilar artery is supplied predominantly by the LEFT vertebral. BILATERAL fetal PCA segments. Mid basilar stenosis approaches 50%. IMPRESSION: Extensive areas of acute nonhemorrhagic infarction throughout much of the LEFT MCA territory, with sparing of the basal ganglia. No proximal large vessel occlusion, but there is either a focal high-grade stenosis or intraluminal filling defect in the LEFT MCA, distal M1 segment. This is not well seen on CTA. Mid basilar stenosis estimated 50%, along with basilar hypoplasia due to fetal PCA segments. Dominant LEFT vertebral. Electronically Signed   By: Jackquline Denmark  Curnes M.D.   On: 04/26/2015 17:00   Mr Brain Wo Contrast  04/26/2015  CLINICAL DATA:  RIGHT-sided weakness.  Unknown last seen normal. EXAM: MRI HEAD WITHOUT CONTRAST MRA HEAD WITHOUT CONTRAST TECHNIQUE: Multiplanar, multiecho pulse sequences of the brain and surrounding structures were obtained without intravenous contrast. Angiographic images of the head were  obtained using MRA technique without contrast. COMPARISON:  CT head, CT angio head neck, earlier today. FINDINGS: MRI HEAD FINDINGS The patient was unable to remain motionless for the exam. Small or subtle lesions could be overlooked. Extensive gyriform restricted diffusion throughout much of the LEFT MCA territory, including frontal, temporal, insular, and parietal lobes, as well as adjacent subcortical white matter. No definite hemorrhagic transformation. Generalized atrophy premature for age. Minor white matter disease, nonspecific. No midline abnormalities. Flow voids are maintained. No extracranial soft tissue abnormalities of significance. MRA HEAD FINDINGS The internal carotid arteries are widely patent. The RIGHT anterior cerebral artery is severely diseased, and both anterior cerebral is are predominantly supplied from the LEFT. The RIGHT MCA trunk and branches are normal. On the LEFT, distal M1 segment, there appears to be a flow reducing lesion. Patient motion degrades the axial source images. It is not clear from interrogation of those axial source images, as well as correlation with CTA, if this represents a severe stenosis or intraluminal filling defect. See for instance image 14 series 501, as well as image 98, series 5. Decreased size and number of distal LEFT MCA branches; the distal M1 lesion is nonocclusive. The basilar artery is supplied predominantly by the LEFT vertebral. BILATERAL fetal PCA segments. Mid basilar stenosis approaches 50%. IMPRESSION: Extensive areas of acute nonhemorrhagic infarction throughout much of the LEFT MCA territory, with sparing of the basal ganglia. No proximal large vessel occlusion, but there is either a focal high-grade stenosis or intraluminal filling defect in the LEFT MCA, distal M1 segment. This is not well seen on CTA. Mid basilar stenosis estimated 50%, along with basilar hypoplasia due to fetal PCA segments. Dominant LEFT vertebral. Electronically Signed    By: Elsie Stain M.D.   On: 04/26/2015 17:00   Jamal Collin, MD 04/27/2015, 10:44 AM PGY-3, [facility] FPTS Intern pager: [phone], text pages welcome

## 2015-04-27 NOTE — Progress Notes (Signed)
Pt c/o headache. No prn ordered.  Notified MD new orders received. Will continue to monitor. Gary Barker, [name]

## 2015-04-27 NOTE — Progress Notes (Signed)
Patients wife came out of patients room stating she was going home related to tiredness. Patients wife also stated that patient spoke to her just minutes ago asking her if she was okay baby. Patient rolled up in covers. Will attempt to perform another neuro check soon.

## 2015-04-27 NOTE — Progress Notes (Signed)
STROKE TEAM PROGRESS NOTE   HISTORY DOCUMENTED AT THE TIME OF INITIAL ASSESSMENT Gary Barker is a 50 y.o. male who was last seen in his normal state around 5 or 6 PM last night. He was then seen to be abnormal around 9 PM. His wife works third shift, this morning when his wife saw him around 7 AM, he was asking inappropriate questions and dragging his right foot. She therefore brought him into the emergency room where CT head was performed which shows patchy left MCA territory infarct.  Of note, he states that he gets inflamed joints from time to time, and recently had inflamed bilateral thumb joints and is on prednisone for what I presume is gout.  LKW: 9 PM tpa given?: no, out of window   SUBJECTIVE (INTERVAL HISTORY) His wife, daughter, brother and other family members were at the bedside.  Overall he is unable to discuss his condition due to aphasia.  Discussed diagnosis and prognosis with the family.  Urgent CT scan done early in the AM when patient's exam declined.  No additional changes notes   OBJECTIVE Temp:  [97.9 F (36.6 C)-98.7 F (37.1 C)] 98 F (36.7 C) (01/15 0341) Pulse Rate:  [70-89] 85 (01/15 0734) Cardiac Rhythm:  [-] Normal sinus rhythm (01/14 2008) Resp:  [11-26] 17 (01/15 0734) BP: (135-190)/(89-119) 174/98 mmHg (01/15 0734) SpO2:  [96 %-100 %] 100 % (01/15 0734) Weight:  [61 kg (134 lb 7.7 oz)] 61 kg (134 lb 7.7 oz) (01/14 2008)  CBC:  Recent Labs Lab 04/26/15 1203 04/26/15 1216 04/27/15 0300  WBC 7.5  --  7.8  NEUTROABS 4.7  --   --   HGB 16.4 17.3* 15.8  HCT 46.5 51.0 45.2  MCV 93.6  --  94.2  PLT 193  --  206    Basic Metabolic Panel:  Recent Labs Lab 04/26/15 1203 04/26/15 1216 04/27/15 0300  NA 140 138 138  K 4.9 5.4* 4.0  CL 102 101 106  CO2 26  --  22  GLUCOSE 98 91 96  BUN 9 12 8   CREATININE 0.75 0.70 0.68  CALCIUM 9.4  --  8.5*  MG 2.6*  --   --     Lipid Panel:    Component Value Date/Time   CHOL 206* 04/27/2015 0300    TRIG 105 04/27/2015 0300   HDL 83 04/27/2015 0300   CHOLHDL 2.5 04/27/2015 0300   VLDL 21 04/27/2015 0300   LDLCALC 102* 04/27/2015 0300   HgbA1c: No results found for: HGBA1C Urine Drug Screen:    Component Value Date/Time   LABOPIA NONE DETECTED 04/26/2015 1211   COCAINSCRNUR NONE DETECTED 04/26/2015 1211   LABBENZ NONE DETECTED 04/26/2015 1211   AMPHETMU NONE DETECTED 04/26/2015 1211   THCU NONE DETECTED 04/26/2015 1211   LABBARB NONE DETECTED 04/26/2015 1211      IMAGING  Ct Angio Head W/cm &/or Wo Cm 04/26/2015   Technically poor quality CTA head neck. The study is sufficiently diagnostic to exclude a flow reducing extracranial stenosis or intracranial large vessel occlusion. Multifocal areas of nonhemorrhagic LEFT MCA territory infarction are suspected. MRI brain and intracranial MRA is pending.   Ct Head Wo Contrast 04/27/2015   No acute intracranial hemorrhage. Large left MCA territory ischemic infarct.   Ct Head Wo Contrast 04/26/2015   1. Acute infarction involving the LEFT insular ribbon and external capsule.  2. Potential thrombus within the LEFT middle cerebral artery.    Ct Angio Neck W/cm &/or  Wo/cm 04/26/2015   Technically poor quality CTA head neck. The study is sufficiently diagnostic to exclude a flow reducing extracranial stenosis or intracranial large vessel occlusion. Multifocal areas of nonhemorrhagic LEFT MCA territory infarction are suspected. MRI brain and intracranial MRA is pending.   Mr Shirlee Latch Wo Contrast 04/26/2015   Extensive areas of acute nonhemorrhagic infarction throughout much of the LEFT MCA territory, with sparing of the basal ganglia. No proximal large vessel occlusion, but there is either a focal high-grade stenosis or intraluminal filling defect in the LEFT MCA, distal M1 segment. This is not well seen on CTA. Mid basilar stenosis estimated 50%, along with basilar hypoplasia due to fetal PCA segments. Dominant LEFT vertebral.      PHYSICAL EXAM Constitutional: Appears well-developed and well-nourished.  Psych: Affect appropriate to situation Eyes: No scleral injection HENT: No OP obstrucion Head: Normocephalic.  Cardiovascular: Normal rate and regular rhythm.  Respiratory: Effort normal and breath sounds normal to anterior ascultation GI: Soft. No distension. There is no tenderness.  Skin: WDI  Neuro: Mental Status: Patient is lethargic but arouses easily.  Patient is no longer able to give a clear and coherent history. He has global aphasia. He is able to spontaneously speak in sentences e.g. "I am fine." However, he does not follow commands   Cranial Nerves: II: PERRL; VF decreased to threat from right.    III,IV, VI: EOMI without ptosis or diploplia.  V: Facial sensation difficult to assess VII: Facial movement is symmetric.  VIII: hearing is intact to voice XII: tongue is midline without atrophy or fasciculations.   Motor: Tone is normal. Bulk is normal. Right hemiparesis.  He is antigravity and demonstrates strength but it's weaker than the left  Sensory:  Sensation is symmetric to pinprick  Cerebellar: Deferred  ASSESSMENT/PLAN [name] is a 50 y.o. male with suspected history of  Gout, asthma, tobacco use, and alcohol abuse presenting with altered mental status and right lower extremity weakness. He did not receive IV t-PA due to late presentation.   Stroke:  Dominant infarct possibly thromboembolic.  Resultant  Global aphasia and right hemiparesis  MRI - Extensive areas of acute nonhemorrhagic infarction throughout much of the LEFT MCA territory,  MRA - Either a focal high-grade stenosis or intraluminal filling defect in the LEFT MCA, distal M1 segment.  Carotid Doppler - refer to CTA of the neck.  2D Echo - pending  LDL - 102  HgbA1c pending  VTE prophylaxis - Lovenox  Diet NPO time specified  No antithrombotic prior to admission, now on aspirin 300 mg  suppository daily  Patient counseled to be compliant with his antithrombotic medications  Ongoing aggressive stroke risk factor management  Therapy recommendations: Pending  Disposition: Pending  Hypertension  Blood pressure is elevated  Permissive hypertension (OK if < 220/120) but gradually normalize in 5-7 days  Hyperlipidemia  Home meds: No lipid lowering medications prior to admission  LDL 102, goal < 70  Recommend Lipitor 40 mg daily  Continue statin at discharge  Other Stroke Risk Factors  Cigarette smoker, advised to stop smoking  Heavy ETOH use per family  Gout  Other Active Problems  ETOH history - PRN Ativan. Recommend Vitamin B12, Folate, MVI.  Asthma Hx. - Has PRN MDI.  Hospital day # 1  Delton See [facility] Pager [phone] 04/27/2015, 7:44 AM    ATTENDING NOTE: Patient was seen and examined by me personally. Documentation reflects findings. The laboratory and radiographic studies  reviewed by me. ROS pertinent positives could not be fully documented due to aphasia  Condition:  High potential for cerebral edema  Assessment and plan completed by me personally and fully documented above. Plans/Recommendations include:     Monitor closely for cerebral edema  Continue stroke work-up  Thiamine, folate, CIWA protocol  Discussed case with family in great detail, including review of the MRI  Therapies and Navigator  SIGNED BY: Dr. Sula Sodahere Christiano Blandon      To contact Stroke Continuity provider, please refer to [email or web address]. After hours, contact General Neurology

## 2015-04-27 NOTE — Progress Notes (Signed)
Called by primary RN to complete shift NIHSS for pt.  On arrival to the room the pt is easily awakened but appears disoriented.  Spanish interpreter present and I attempted to complete NIH assessment.  The pt did not appear to understand the interpreter and would not follow commands.  He would nod yes to every questions or command request.  He would hold BUE up when I raised them and drifted LE.  Pt was nonverbal.  Primary RN states he was difficult to awaken at 11pm and he appears less responsive since that time.  Spoke with Dr. Wyatt PortelaSomner and pt taken for CT head to eval for swelling or bleed.  New orders rcvd for BP parameters and neuro checks to remain q2hrs.

## 2015-04-28 ENCOUNTER — Encounter (HOSPITAL_COMMUNITY): Payer: Self-pay | Admitting: Physical Medicine and Rehabilitation

## 2015-04-28 DIAGNOSIS — Z720 Tobacco use: Secondary | ICD-10-CM

## 2015-04-28 DIAGNOSIS — I10 Essential (primary) hypertension: Secondary | ICD-10-CM

## 2015-04-28 DIAGNOSIS — I69391 Dysphagia following cerebral infarction: Secondary | ICD-10-CM | POA: Insufficient documentation

## 2015-04-28 DIAGNOSIS — I63412 Cerebral infarction due to embolism of left middle cerebral artery: Secondary | ICD-10-CM

## 2015-04-28 DIAGNOSIS — I69320 Aphasia following cerebral infarction: Secondary | ICD-10-CM | POA: Insufficient documentation

## 2015-04-28 DIAGNOSIS — I639 Cerebral infarction, unspecified: Secondary | ICD-10-CM

## 2015-04-28 DIAGNOSIS — F1020 Alcohol dependence, uncomplicated: Secondary | ICD-10-CM

## 2015-04-28 DIAGNOSIS — M1A9XX0 Chronic gout, unspecified, without tophus (tophi): Secondary | ICD-10-CM | POA: Insufficient documentation

## 2015-04-28 LAB — CBC
HEMATOCRIT: 44.1 % (ref 39.0–52.0)
Hemoglobin: 15.2 g/dL (ref 13.0–17.0)
MCH: 32.1 pg (ref 26.0–34.0)
MCHC: 34.5 g/dL (ref 30.0–36.0)
MCV: 93 fL (ref 78.0–100.0)
PLATELETS: 196 10*3/uL (ref 150–400)
RBC: 4.74 MIL/uL (ref 4.22–5.81)
RDW: 11.6 % (ref 11.5–15.5)
WBC: 8 10*3/uL (ref 4.0–10.5)

## 2015-04-28 LAB — GLUCOSE, CAPILLARY: Glucose-Capillary: 97 mg/dL (ref 65–99)

## 2015-04-28 LAB — HEMOGLOBIN A1C
HEMOGLOBIN A1C: 6.5 % — AB (ref 4.8–5.6)
MEAN PLASMA GLUCOSE: 140 mg/dL

## 2015-04-28 LAB — BASIC METABOLIC PANEL
ANION GAP: 7 (ref 5–15)
BUN: 6 mg/dL (ref 6–20)
CO2: 24 mmol/L (ref 22–32)
Calcium: 8.8 mg/dL — ABNORMAL LOW (ref 8.9–10.3)
Chloride: 107 mmol/L (ref 101–111)
Creatinine, Ser: 0.72 mg/dL (ref 0.61–1.24)
GLUCOSE: 123 mg/dL — AB (ref 65–99)
POTASSIUM: 3.7 mmol/L (ref 3.5–5.1)
Sodium: 138 mmol/L (ref 135–145)

## 2015-04-28 NOTE — Care Management Note (Signed)
Case Management Note  Patient Details  Name: Gary Barker MRN: [id number] Date of Birth: 12/05/1965  Subjective/Objective:     Adm w cva               Action/Plan: lives at home   Expected Discharge Date:                  Expected Discharge Plan:     In-House Referral:     Discharge planning Services     Post Acute Care Choice:    Choice offered to:     DME Arranged:    DME Agency:     HH Arranged:    HH Agency:     Status of Service:     Medicare Important Message Given:    Date Medicare IM Given:    Medicare IM give by:    Date Additional Medicare IM Given:    Additional Medicare Important Message give by:     If discussed at Long Length of Stay Meetings, dates discussed:    Additional Comments: ur review done  Hanley HaysDowell, Noemy Hallmon T, RN 04/28/2015, 9:33 AM

## 2015-04-28 NOTE — Progress Notes (Signed)
    [facility] has been requested to perform a transesophageal echocardiogram on Gary Barker for acute cerebrovascular accident.  After careful review of history and examination, the risks and benefits of transesophageal echocardiogram have been explained including risks of esophageal damage, perforation (1:10,000 risk), bleeding, pharyngeal hematoma as well as other potential complications associated with conscious sedation including aspiration, arrhythmia, respiratory failure and death. Alternatives to treatment were discussed, questions were answered. The procedure was discussed with the patient, his mother, and brother. They are all in agreement to proceed.  SBP in the 140's - 180's over the past 24 hours, no requirement for pressors. Hgb stable at 15.2. Platelets at 196.  Ellsworth LennoxBrittany M Marcelles Clinard, PA-C 04/28/2015 2:32 PM

## 2015-04-28 NOTE — Consult Note (Signed)
    Physical Medicine and Rehabilitation Consult  Reason for Consult: Right sided weakness, speech difficulty, cognitive deficits Referring Physician: Dr. Neal  HPI: Gary Barker is a 50 y.o. male with history of tobacco use, recent gout flare and heavy alcohol use who was admitted on  04/26/15 am with right sided weakness and confusion. He was last seen normal the night before.  CTA head/neck limited but excluded large vessel occlusion. MRI/MRA brain done revealing extensive areas of non-hemorrhagic infarct throughout much of L-MCA territory sparing basal ganglia, focal high grade stenosis or filling defect in L-MCA distal M1 segment and approximately 50%  mid basilar stenosis. 2D echo with EF 55-60% with no wall abnormality and grade 1 diastolic dysfunction. Swallow evaluation without signs of aspiration and patient placed on dysphagia 3, thin liquids.  Cognitive evaluation shows mixed transcortical aphasia impacting expressive and receptive language with 25% accuracy for basic Y/N questions and ability to follow commands. PT evaluation completed today revealing right knee instability with ataxia, staggering gait and impaired cognition impacting safety awareness.  Neurology recommended ASA for stroke prevention and TEE ordered for work up.    Review of Systems  Unable to perform ROS: language      History reviewed. No pertinent past medical history.    History reviewed. No pertinent past surgical history.    History reviewed. No pertinent family history.    Social History:   Lives with family.  reports that he has never smoked. He does not have any smokeless tobacco history on file. He reports that he drinks alcohol. He reports that he does not use illicit drugs.    Allergies: No Known Allergies    Medications Prior to Admission  Medication Sig Dispense Refill  . albuterol-ipratropium (COMBIVENT) 18-103 MCG/ACT inhaler Inhale 1-2 puffs into the lungs every 6 (six) hours as needed  for wheezing or shortness of breath.    . predniSONE (DELTASONE) 10 MG tablet Take 10 mg by mouth See admin instructions.       Home: Home Living Family/patient expects to be discharged to:: Private residence Living Arrangements: Spouse/significant other, Children (18 year old and 9 year old daughters) Available Help at Discharge: Family, Available PRN/intermittently (wife works 11pm to 7am; extended family lives out of town) Type of Home: House Home Access: Stairs to enter Entrance Stairs-Number of Steps: 4 Entrance Stairs-Rails: None Home Layout: Two level, Bed/bath upstairs Alternate Level Stairs-Number of Steps: 11 Alternate Level Stairs-Rails: Right Bathroom Shower/Tub: Tub/shower unit Bathroom Toilet: Standard Bathroom Accessibility: Yes Home Equipment: None  Lives With: Family  Functional History: Prior Function Level of Independence: Independent Comments: Pt works daytime at plant.  Had recently been off due to gout flareup. Family worried about money.  Sister in from [place] and she is asking about getting financial help for this family.  Asked CM for SW consult.  Functional Status:  Mobility: Bed Mobility Overal bed mobility: Needs Assistance Bed Mobility: Supine to Sit Supine to sit: Min guard General bed mobility comments: Close guard assist for safety as pt with decr awareness of safety with mobility.   Transfers Overall transfer level: Needs assistance Equipment used: None Transfers: Sit to/from Stand Sit to Stand: Min assist General transfer comment: Pt needs min assist for steadying as he is slightly impulsive with right LE poor coordination and weakness.   Ambulation/Gait Ambulation/Gait assistance: Min assist, Mod assist Ambulation Distance (Feet): 110 Feet Assistive device: None Gait Pattern/deviations: Step-through pattern, Decreased step length - right, Decreased dorsiflexion - right,   Decreased stance time - right, Decreased weight shift to right, Ataxic,  Staggering left, Staggering right General Gait Details: Pt able to ambulate but needing min assist to mod assist at times as he has right knee instability and decr safety as he is unaware of deficits decreasing his safety with mobility tasks.  Pt at high risk of falls due to weakness right hemibody and cognitive issues affecting safety. Pt unable to express his thoughts due to aphasia as well.  Overall, very impaired cognitively and functionally.   Gait velocity interpretation: Below normal speed for age/gender    ADL:    Cognition: Cognition Overall Cognitive Status: Difficult to assess (aphasia) Arousal/Alertness: Awake/alert Orientation Level: Other (comment) (Oriented to hospital and self clearly- more clear than AM) Attention: Selective Selective Attention: Impaired Selective Attention Impairment: Verbal basic, Functional basic Safety/Judgment: Impaired Cognition Arousal/Alertness: Awake/alert Behavior During Therapy: Flat affect Overall Cognitive Status: Difficult to assess (aphasia) Difficult to assess due to:  (significant expressive and receptive aphasia)  Blood pressure 171/110, pulse 93, temperature 98.8 F (37.1 C), temperature source Oral, resp. rate 19, height 5' 9" (1.753 m), weight 61 kg (134 lb 7.7 oz), SpO2 100 %. Physical Exam  Vitals reviewed. Constitutional: He appears well-developed and well-nourished.  HENT:  Head: Normocephalic and atraumatic.  Eyes: Conjunctivae and EOM are normal.  Neck: Normal range of motion. Neck supple.  Cardiovascular: Normal rate and regular rhythm.   Respiratory: Effort normal and breath sounds normal.  GI: Soft. Bowel sounds are normal.  Musculoskeletal: He exhibits no edema or tenderness.  Neurological: He is alert.  Mixed transcortical aphasia DTRs 3+ RLE Neg clonus, Neg hoffman's Motor: >/4/5 throughout Unable to accurately assess MMT and sensation due to aphasia  Skin: Skin is warm and dry.  Psychiatric: His affect is  inappropriate. His speech is delayed and tangential. Cognition and memory are impaired. He expresses inappropriate judgment.    Results for orders placed or performed during the hospital encounter of 04/26/15 (from the past 24 hour(s))  Folate     Status: None   Collection Time: 04/27/15  3:55 PM  Result Value Ref Range   Folate 69.7 >5.9 ng/mL  Vitamin B12     Status: None   Collection Time: 04/27/15  3:55 PM  Result Value Ref Range   Vitamin B-12 330 180 - 914 pg/mL  CBC     Status: None   Collection Time: 04/28/15  2:20 AM  Result Value Ref Range   WBC 8.0 4.0 - 10.5 K/uL   RBC 4.74 4.22 - 5.81 MIL/uL   Hemoglobin 15.2 13.0 - 17.0 g/dL   HCT 44.1 39.0 - 52.0 %   MCV 93.0 78.0 - 100.0 fL   MCH 32.1 26.0 - 34.0 pg   MCHC 34.5 30.0 - 36.0 g/dL   RDW 11.6 11.5 - 15.5 %   Platelets 196 150 - 400 K/uL  Basic metabolic panel     Status: Abnormal   Collection Time: 04/28/15  2:20 AM  Result Value Ref Range   Sodium 138 135 - 145 mmol/L   Potassium 3.7 3.5 - 5.1 mmol/L   Chloride 107 101 - 111 mmol/L   CO2 24 22 - 32 mmol/L   Glucose, Bld 123 (H) 65 - 99 mg/dL   BUN 6 6 - 20 mg/dL   Creatinine, Ser 0.72 0.61 - 1.24 mg/dL   Calcium 8.8 (L) 8.9 - 10.3 mg/dL   GFR calc non Af Amer >60 >60 mL/min   GFR calc   Af Amer >60 >60 mL/min   Anion gap 7 5 - 15   Ct Head Wo Contrast  04/27/2015  CLINICAL DATA:  49-year-old male with altered mental status EXAM: CT HEAD WITHOUT CONTRAST TECHNIQUE: Contiguous axial images were obtained from the base of the skull through the vertex without intravenous contrast. COMPARISON:  Brain CT and MRI dated 04/26/2015 FINDINGS: There are areas of cortical and subcortical hypodensity involving the left frontal, temporal, and parietal lobe in the left MCA territory distribution compatible with ischemic infarct. There has been interval increase and conspicuity of these hypodensities compared the prior CT compatible with edema. There is no acute intracranial  hemorrhage. Ventricles and sulci are appropriate in size for patient's age. There is no significant mass effect. No midline shift. The visualized paranasal sinuses and mastoid air cells are well aerated. The calvarium is intact. IMPRESSION: No acute intracranial hemorrhage. Large left MCA territory ischemic infarct. Electronically Signed   By: Arash  Radparvar M.D.   On: 04/27/2015 01:07   Mr Mra Head Wo Contrast  04/26/2015  CLINICAL DATA:  RIGHT-sided weakness.  Unknown last seen normal. EXAM: MRI HEAD WITHOUT CONTRAST MRA HEAD WITHOUT CONTRAST TECHNIQUE: Multiplanar, multiecho pulse sequences of the brain and surrounding structures were obtained without intravenous contrast. Angiographic images of the head were obtained using MRA technique without contrast. COMPARISON:  CT head, CT angio head neck, earlier today. FINDINGS: MRI HEAD FINDINGS The patient was unable to remain motionless for the exam. Small or subtle lesions could be overlooked. Extensive gyriform restricted diffusion throughout much of the LEFT MCA territory, including frontal, temporal, insular, and parietal lobes, as well as adjacent subcortical white matter. No definite hemorrhagic transformation. Generalized atrophy premature for age. Minor white matter disease, nonspecific. No midline abnormalities. Flow voids are maintained. No extracranial soft tissue abnormalities of significance. MRA HEAD FINDINGS The internal carotid arteries are widely patent. The RIGHT anterior cerebral artery is severely diseased, and both anterior cerebral is are predominantly supplied from the LEFT. The RIGHT MCA trunk and branches are normal. On the LEFT, distal M1 segment, there appears to be a flow reducing lesion. Patient motion degrades the axial source images. It is not clear from interrogation of those axial source images, as well as correlation with CTA, if this represents a severe stenosis or intraluminal filling defect. See for instance image 14 series  501, as well as image 98, series 5. Decreased size and number of distal LEFT MCA branches; the distal M1 lesion is nonocclusive. The basilar artery is supplied predominantly by the LEFT vertebral. BILATERAL fetal PCA segments. Mid basilar stenosis approaches 50%. IMPRESSION: Extensive areas of acute nonhemorrhagic infarction throughout much of the LEFT MCA territory, with sparing of the basal ganglia. No proximal large vessel occlusion, but there is either a focal high-grade stenosis or intraluminal filling defect in the LEFT MCA, distal M1 segment. This is not well seen on CTA. Mid basilar stenosis estimated 50%, along with basilar hypoplasia due to fetal PCA segments. Dominant LEFT vertebral. Electronically Signed   By: John T Curnes M.D.   On: 04/26/2015 17:00   Mr Brain Wo Contrast  04/26/2015  CLINICAL DATA:  RIGHT-sided weakness.  Unknown last seen normal. EXAM: MRI HEAD WITHOUT CONTRAST MRA HEAD WITHOUT CONTRAST TECHNIQUE: Multiplanar, multiecho pulse sequences of the brain and surrounding structures were obtained without intravenous contrast. Angiographic images of the head were obtained using MRA technique without contrast. COMPARISON:  CT head, CT angio head neck, earlier today. FINDINGS: MRI HEAD FINDINGS The   patient was unable to remain motionless for the exam. Small or subtle lesions could be overlooked. Extensive gyriform restricted diffusion throughout much of the LEFT MCA territory, including frontal, temporal, insular, and parietal lobes, as well as adjacent subcortical white matter. No definite hemorrhagic transformation. Generalized atrophy premature for age. Minor white matter disease, nonspecific. No midline abnormalities. Flow voids are maintained. No extracranial soft tissue abnormalities of significance. MRA HEAD FINDINGS The internal carotid arteries are widely patent. The RIGHT anterior cerebral artery is severely diseased, and both anterior cerebral is are predominantly supplied from  the LEFT. The RIGHT MCA trunk and branches are normal. On the LEFT, distal M1 segment, there appears to be a flow reducing lesion. Patient motion degrades the axial source images. It is not clear from interrogation of those axial source images, as well as correlation with CTA, if this represents a severe stenosis or intraluminal filling defect. See for instance image 14 series 501, as well as image 98, series 5. Decreased size and number of distal LEFT MCA branches; the distal M1 lesion is nonocclusive. The basilar artery is supplied predominantly by the LEFT vertebral. BILATERAL fetal PCA segments. Mid basilar stenosis approaches 50%. IMPRESSION: Extensive areas of acute nonhemorrhagic infarction throughout much of the LEFT MCA territory, with sparing of the basal ganglia. No proximal large vessel occlusion, but there is either a focal high-grade stenosis or intraluminal filling defect in the LEFT MCA, distal M1 segment. This is not well seen on CTA. Mid basilar stenosis estimated 50%, along with basilar hypoplasia due to fetal PCA segments. Dominant LEFT vertebral. Electronically Signed   By: John T Curnes M.D.   On: 04/26/2015 17:00    Assessment/Plan: Diagnosis: L MCA non-hemorrhagic infarct  Labs and images independently reviewed.  Records reviewed and summated above. Stroke: Continue secondary stroke prophylaxis and Risk Factor Modification listed below:   Antiplatelet therapy:   Blood Pressure Management:  Continue current medication with prn's with permisive HTN per primary team Statin Agent:   Diabetes management:  Follow HbA1c Tobacco abuse:  Cont counseling  1. Does the need for close, 24 hr/day medical supervision in concert with the patient's rehab needs make it unreasonable for this patient to be served in a less intensive setting? Yes  2. Co-Morbidities requiring supervision/potential complications: tobacco use (cont to counsel), recent gout flare (ensure pain does not limit therapies,  cont meds), and heavy alcohol (CIWA, cont to counsel), dysphagia (Cont SLP), HTN (monitor and provide prns in accordance with increased physical exertion and pain) 3. Due to safety, skin/wound care, disease management, medication administration and patient education, does the patient require 24 hr/day rehab nursing? Yes 4. Does the patient require coordinated care of a physician, rehab nurse, PT (1-2 hrs/day, 5 days/week), OT (1-2 hrs/day, 5 days/week) and SLP (1-2 hrs/day, 5 days/week) to address physical and functional deficits in the context of the above medical diagnosis(es)? Yes Addressing deficits in the following areas: balance, endurance, locomotion, strength, transferring, bathing, dressing, feeding, toileting, cognition, speech, language, swallowing and psychosocial support 5. Can the patient actively participate in an intensive therapy program of at least 3 hrs of therapy per day at least 5 days per week? Yes 6. The potential for patient to make measurable gains while on inpatient rehab is excellent 7. Anticipated functional outcomes upon discharge from inpatient rehab are modified independent and supervision  with PT, modified independent and supervision with OT, supervision and min assist with SLP. 8. Estimated rehab length of stay to reach the above functional   goals is: 19-22 days. 9. Does the patient have adequate social supports and living environment to accommodate these discharge functional goals? Potentially 10. Anticipated D/C setting: Home 11. Anticipated post D/C treatments: HH therapy and Home excercise program 12. Overall Rehab/Functional Prognosis: good  RECOMMENDATIONS: This patient's condition is appropriate for continued rehabilitative care in the following setting: Pt and family to discuss if pt will have 24/7 support at discharge.  At present he does not and it is not likely pt will be able to achive a Mod I level of functioning after a short IRF.  It family is able to  provide support would recommend CIR after acute medical workup completed.   Patient has agreed to participate in recommended program. Potentially Note that insurance prior authorization may be required for reimbursement for recommended care.  Comment: Rehab Admissions Coordinator to follow up.  Tilden Broz, MD 04/28/2015  

## 2015-04-28 NOTE — Progress Notes (Signed)
Speech Language Pathology Treatment: Dysphagia  Patient Details Name: Gary Barker MRN: [id number] DOB: 02/08/1966 Today's Date: 04/28/2015 Time: [id number] SLP Time Calculation (min) (ACUTE ONLY): 26 min  Assessment / Plan / Recommendation Clinical Impression  Pt consumed regular textured solids without overt signs of difficulty. Oral phase appeared [facility], although sister at bedside does report impulsivity with intake and need for cueing during meals to swallow boluses prior to taking additional bites. Given current cognitive-linguistic function, recommend to continue with current diet texture. Prognosis for advancement is good with improved awareness. Will continue to follow.   HPI HPI: Gary LayerJuan Burling is a 50 y.o. male who was last seen in his normal state around 5 or 6 PM last night. He was then seen to be abnormal around 9 PM. His wife works third shift, this morning when his wife saw him around 7 AM, he was asking inappropriate questions and dragging his right foot. She therefore brought him into the emergency room where CT head was performed which shows patchy left MCA territory infarct. Function has been waxing and waning, though pt passed RN stroke swallow SLP ordered to confirm tolerance. Pt is primary [place] speaking.       SLP Plan  Continue with current plan of care     Recommendations  Diet recommendations: Dysphagia 3 (mechanical soft);Thin liquid Liquids provided via: Cup;Straw Medication Administration: Whole meds with liquid Supervision: Patient able to self feed;Full supervision/cueing for compensatory strategies Compensations: Minimize environmental distractions;Slow rate;Small sips/bites Postural Changes and/or Swallow Maneuvers: Seated upright 90 degrees             Oral Care Recommendations: Oral care BID Follow up Recommendations: Inpatient Rehab;24 hour supervision/assistance Plan: Continue with current plan of care     GO               Maxcine HamLaura Paiewonsky, M.A.  CCC-SLP [phone]  Maxcine Hamaiewonsky, Chancey Cullinane 04/28/2015, 11:56 AM

## 2015-04-28 NOTE — Evaluation (Signed)
Speech Language Pathology Evaluation Patient Details Name: Gary Barker MRN: [id number] DOB: March 03, 1966 Today's Date: 04/28/2015 Time: 7829-5621 SLP Time Calculation (min) (ACUTE ONLY): 26 min  Problem List:  Patient Active Problem List   Diagnosis Date Noted  . Altered mental status   . Alcoholism (HCC)   . Essential hypertension   . Tobacco abuse   . CVA (cerebral infarction) 04/26/2015  . Stroke (cerebrum) (HCC) 04/26/2015  . Cerebral infarction due to unspecified mechanism   . Alcohol use disorder (HCC)   . Tobacco use disorder    Past Medical History: History reviewed. No pertinent past medical history. Past Surgical History: History reviewed. No pertinent past surgical history. HPI:  Gary Barker is a 50 y.o. male who was last seen in his normal state around 5 or 6 PM last night. He was then seen to be abnormal around 9 PM. His wife works third shift, this morning when his wife saw him around 7 AM, he was asking inappropriate questions and dragging his right foot. She therefore brought him into the emergency room where CT head was performed which shows patchy left MCA territory infarct. Function has been waxing and waning, though pt passed RN stroke swallow SLP ordered to confirm tolerance. Pt is primary [place] speaking.    Assessment / Plan / Recommendation Clinical Impression  Pt presents with a mixed transcortical aphasia, impacting both expressive and receptive language skills. He has some spontaneous verbal output, although it is limited, perseverative, and mostly consists of telegraphic phrases. He is approximately 25% accurate with confrontational naming task with Max cues from SLP. Receptively he is less than 25% accurate with basic yes/no questions and needs Max cues, primarily visual/gestural, to follow one-step commands. He has improved output with automatic speech tasks, particularly with counting (counted 1-17 indpendently). He says the days of the week and months of the  year with Mod-Max A from SLP, particularly due to phonemic paraphasias. There is certainly some level of awareness, as pt was appropriately emotional throughout assessment. He appears to have reduced selective attention, although further differential diagnosis of cognitive abilities will be needed as langauge improves. Pt will need acute SLP services. Recommend CIR level therapy for f/u to maximize functional communication and safety.    SLP Assessment  Patient needs continued Speech Lanaguage Pathology Services    Follow Up Recommendations  Inpatient Rehab;24 hour supervision/assistance    Frequency and Duration min 2x/week  2 weeks      SLP Evaluation Prior Functioning  Cognitive/Linguistic Baseline: Within functional limits  Lives With: Family Available Help at Discharge: Family Vocation: Full time employment   Cognition  Overall Cognitive Status: Difficult to assess (aphasia) Arousal/Alertness: Awake/alert Orientation Level: Other (comment) (Oriented to hospital and self clearly- more clear than AM) Attention: Selective Selective Attention: Impaired Selective Attention Impairment: Verbal basic;Functional basic Safety/Judgment: Impaired    Comprehension  Auditory Comprehension Overall Auditory Comprehension: Impaired Yes/No Questions: Impaired Basic Biographical Questions: 0-25% accurate Basic Immediate Environment Questions: 0-24% accurate Commands: Impaired One Step Basic Commands: 25-49% accurate Interfering Components: Attention EffectiveTechniques: Visual/Gestural cues Visual Recognition/Discrimination Discrimination: Exceptions to [facility] Common Objects: Unable to indentify    Expression Expression Primary Mode of Expression: Verbal Verbal Expression Overall Verbal Expression: Impaired Initiation: No impairment Automatic Speech: Counting;Day of week;Month of year (although with phonemic paraphasias) Level of Generative/Spontaneous Verbalization: Phrase Naming:  Impairment Confrontation: Impaired Common Objects: Unable to indentify Verbal Errors: Phonemic paraphasias;Not aware of errors;Perseveration   Oral / Motor  Oral Motor/Sensory Function Overall Oral Motor/Sensory  Function: Moderate impairment Facial ROM: Reduced right;Suspected CN VII (facial) dysfunction Facial Symmetry: Abnormal symmetry right;Suspected CN VII (facial) dysfunction Facial Strength: Reduced right;Suspected CN VII (facial) dysfunction Lingual ROM: Within Functional Limits Lingual Symmetry: Within Functional Limits Lingual Strength: Within Functional Limits Lingual Sensation: Within Functional Limits Velum: Within Functional Limits Mandible: Within Functional Limits Motor Speech Overall Motor Speech: Appears within functional limits for tasks assessed   GO                   Maxcine HamLaura Paiewonsky, M.A. CCC-SLP [phone]  Maxcine Hamaiewonsky, Derek Huneycutt 04/28/2015, 12:08 PM

## 2015-04-28 NOTE — Evaluation (Signed)
Physical Therapy Evaluation Patient Details Name: Gary LayerJuan Shumard MRN: [id number] DOB: 03/24/1966 Today's Date: 04/28/2015   History of Present Illness  Gary Barker is a 50 y.o. male who was last seen in his normal state around 5 or 6 PM last night. He was then seen to be abnormal around 9 PM. His wife works third shift, this morning when his wife saw him around 7 AM, he was asking inappropriate questions and dragging his right foot. She therefore brought him into the emergency room where CT head was performed which shows patchy left MCA territory infarct. Function has been waxing and waning, though pt passed RN stroke swallow SLP ordered to confirm tolerance. Pt is primary [place] speaking.    Clinical Impression  Pt admitted with above diagnosis. Pt currently with functional limitations due to the deficits listed below (see PT Problem List). Pt was able to ambulate but needed mod assist for stability.  Pt significantly impaired due to aphasia and safety issues.  Will benefit from Rehab to reach Independent level to go home with prn family assist.   Will follow acutely.  Pt will benefit from skilled PT to increase their independence and safety with mobility to allow discharge to the venue listed below.     Follow Up Recommendations CIR;Supervision/Assistance - 24 hour    Equipment Recommendations  Other (comment) (TBA)    Recommendations for Other Services Rehab consult     Precautions / Restrictions Precautions Precautions: Fall Restrictions Weight Bearing Restrictions: No      Mobility  Bed Mobility Overal bed mobility: Needs Assistance Bed Mobility: Supine to Sit     Supine to sit: Min guard     General bed mobility comments: Close guard assist for safety as pt with decr awareness of safety with mobility.    Transfers Overall transfer level: Needs assistance Equipment used: None Transfers: Sit to/from Stand Sit to Stand: Min assist         General transfer comment: Pt  needs min assist for steadying as he is slightly impulsive with right LE poor coordination and weakness.    Ambulation/Gait Ambulation/Gait assistance: Min assist;Mod assist Ambulation Distance (Feet): 110 Feet Assistive device: None Gait Pattern/deviations: Step-through pattern;Decreased step length - right;Decreased dorsiflexion - right;Decreased stance time - right;Decreased weight shift to right;Ataxic;Staggering left;Staggering right   Gait velocity interpretation: Below normal speed for age/gender General Gait Details: Pt able to ambulate but needing min assist to mod assist at times as he has right knee instability and decr safety as he is unaware of deficits decreasing his safety with mobility tasks.  Pt at high risk of falls due to weakness right hemibody and cognitive issues affecting safety. Pt unable to express his thoughts due to aphasia as well.  Overall, very impaired cognitively and functionally.    Stairs            Wheelchair Mobility    Modified Rankin (Stroke Patients Only) Modified Rankin (Stroke Patients Only) Pre-Morbid Rankin Score: No significant disability Modified Rankin: Moderately severe disability     Balance Overall balance assessment: Needs assistance Sitting-balance support: No upper extremity supported;Feet supported Sitting balance-Leahy Scale: Fair     Standing balance support: No upper extremity supported;During functional activity Standing balance-Leahy Scale: Poor Standing balance comment: Pt cannot stand statically without UE support due to right hemibody weakness, poor coordination and poor safety awareness.               High level balance activites: Direction changes;Turns;Sudden stops High Level  Balance Comments: Needed mod assist for stability with gait.             Pertinent Vitals/Pain Pain Assessment: No/denies pain Faces Pain Scale: No hurt  HR 92 bpm, 100% O2 on RA, 170/99 BP at end of treatment with nurse aware.      Home Living Family/patient expects to be discharged to:: Private residence Living Arrangements: Spouse/significant other;Children (23 year old and 59 year old daughters) Available Help at Discharge: Family;Available PRN/intermittently (wife works 11pm to 7am; extended family lives out of town) Type of Home: House Home Access: Stairs to enter Entrance Stairs-Rails: None Secretary/administrator of Steps: 4 Home Layout: Two level;Bed/bath upstairs Home Equipment: None      Prior Function Level of Independence: Independent         Comments: Pt works daytime at [employer]  Had recently been off due to gout flareup. Family worried about money.  Sister in from [place] and she is asking about getting financial help for this family.  Asked CM for SW consult.      Hand Dominance        Extremity/Trunk Assessment   Upper Extremity Assessment: Defer to OT evaluation           Lower Extremity Assessment: RLE deficits/detail RLE Deficits / Details: 3/5 grossly    Cervical / Trunk Assessment: Normal  Communication   Communication: Receptive difficulties;Expressive difficulties (pt is primarily [place] speaking)  Cognition Arousal/Alertness: Awake/alert Behavior During Therapy: Flat affect Overall Cognitive Status: Difficult to assess (aphasia)                      General Comments      Exercises General Exercises - Lower Extremity Ankle Circles/Pumps: AROM;Both;10 reps;Seated Long Arc Quad: AROM;Both;10 reps;Seated Hip Flexion/Marching: AROM;Both;5 reps;Seated      Assessment/Plan    PT Assessment Patient needs continued PT services  PT Diagnosis Generalized weakness;Difficulty walking   PT Problem List Decreased activity tolerance;Decreased balance;Decreased mobility;Decreased coordination;Decreased cognition;Decreased strength;Decreased knowledge of use of DME;Decreased safety awareness;Decreased knowledge of precautions  PT Treatment Interventions DME  instruction;Gait training;Functional mobility training;Therapeutic activities;Therapeutic exercise;Balance training;Cognitive remediation;Patient/family education;Neuromuscular re-education;Stair training   PT Goals (Current goals can be found in the Care Plan section) Acute Rehab PT Goals Patient Stated Goal: to go home PT Goal Formulation: With patient Time For Goal Achievement: 05/12/15 Potential to Achieve Goals: Good    Frequency Min 4X/week   Barriers to discharge Decreased caregiver support (wife works and family lives out of town)      Control and instrumentation engineer of Session Equipment Utilized During Treatment: Gait belt Activity Tolerance: Patient limited by fatigue Patient left: in chair;with call bell/phone within reach;with chair alarm set;with family/visitor present Nurse Communication: Mobility status         Time: 1610-9604 PT Time Calculation (min) (ACUTE ONLY): 32 min   Charges:   PT Evaluation $PT Eval Moderate Complexity: 1 Procedure PT Treatments $Gait Training: 8-22 mins   PT G CodesBerline Lopes May 14, 2015, 1:22 PM Shyhiem Beeney,PT Acute Rehabilitation [phone] (pager)

## 2015-04-28 NOTE — Progress Notes (Signed)
Family Medicine Teaching Service Daily Progress Note Intern Pager: [phone]  Patient name: Gary Barker Medical record number: [id number] Date of birth: [birth year] Age: 50 y.o. Gender: male  Primary Care Provider: No primary care provider on file. Consultants: Neuro Code Status: full  Pt Overview and Major Events to Date:  1/14: L MCA CVA  Assessment and Plan: Gary Barker is a 50 y.o. male presenting with ischemic stroke. PMH is significant for heavy drinking, heavy smoking and questionable hypertension.  CVA in LMCA territory: improving based on neuro exam today. Patient with receptive and expressive aphasia consistent with his CT finding. He was outside tpa window on arrival.CT head with acute infarction involving the left insular ribbon and external capsule, and potential thrombus within the left middle cerebral artery. CTA suspicious for multifocal areas of nonhemorrhagic left MCA territory infarction. No sign of infection. UDS negative. EtOH level normal. EKG read as atrial flutter but NSR on further evaluation. Unclear source of stroke at this time. Some concern for multiple emboli breaking from a larger clot source given distribution of infarcts. He does have an extensive smoking history with limited medical follow up to evaluate for potential chronic conditions. MRI: Extensive areas of acute nonhemorrhagic infarction throughout much of the LEFT MCA territory. Echo normal. Lipid panel 102 and HDL 83. TSH within normal. - Appreciate neuro recs.  -TEE  -monitor closely for cerebral edema - ANA - Hypercoagulability labs as outpatient as patient is on Lovenox will. Will get ANA.  - Allow permissive hypertension - f/u A1c - SLP/PT/OT eval - Full dose ASA and Atorvastatin 40 mg   Questionable H/o Hypertension: not clear on this. Seen by Dr. Gerilyn Pilgrim at [facility] for left arm pain on 04/18/2015. Did RA factor which was negative. Started on prednisone. BP at that time 140/80. No  other PMH or labs. -allow permissive hypertension for now BP  to 220/120. Will normalize BP over 5-7 days  H/o asthma?: he is on combivent. Wife says he hasn't used the medication in awhile.  -Albuterol as needed.  Alcohol use disorder: history of heavy drinking per family members -CIWA. Scores = 3> 2>0. No Ativan given -Recommend EtOH rehab on discharge -Checking B12 and Folate -Thiamine started 04/27/15: 500mg  TID x 2 days, then 250mg  qd x 5 days  Tobacco use disorder: unclear how much he smokes but per family he ' is always outside smoking' -Nicotine patch 14 mg qD  FEN/GI: Dys 3 Diet with thin liquids. NS @ 50 cc/hr. Protonix  Prophylaxis: lovenox   Disposition: Pending PT/OT evaluation. Possibly CIR  Subjective:  Patient not able to express. But there is improvement per sister sitting at bedside. Able to hold cups and feed himself and take medicine. He drops his pills but able to find and swallow.  Objective: Temp:  [97.9 F (36.6 C)-99.1 F (37.3 C)] 99.1 F (37.3 C) (01/16 0721) Pulse Rate:  [78-101] 81 (01/16 0721) Resp:  [15-26] 17 (01/16 0721) BP: (140-187)/(85-155) 177/101 mmHg (01/16 0721) SpO2:  [97 %-100 %] 98 % (01/16 0721) Physical Exam: Gen: lying in bed, NAD. Eyes: pupils equal, round and reactive to light Oropharynx: clear, moist  Neck: supple, no LAD CV: regular rate and rythm. S1 & S2 audible, no murmurs. Resp: normal work of breathing, clear to auscultation bilaterally. GI: bowel sounds normal, no tenderness to palpation Skin: no lesions, normal bilateral hands negative for rash or bony abnormalities MSK: normal bulk, symmetric Neuro: MS - Awake, alert. Not oriented to person,  place, and date. Speech with expressive and receptive aphasia. Attention is inappropriate.  Cranial Nerves - Pupils were equal and reactive (5 to 2mm); EOM normal, no nystagmus; no ptosis, face symmetric on smile, not able to assess tongue protrusion due to patient's poor  cooperation,  sternocleidomastoid and trapezius are with normal strength.  Tone - Normal.  Strength - normal in all muscle group  Reflexes - patellar and ankle reflex 2+ bilaterally Coordination : not able to assess Gait: no pronator drift   Laboratory:  Recent Labs Lab 04/26/15 1203 04/26/15 1216 04/27/15 0300 04/28/15 0220  WBC 7.5  --  7.8 8.0  HGB 16.4 17.3* 15.8 15.2  HCT 46.5 51.0 45.2 44.1  PLT 193  --  206 196    Recent Labs Lab 04/26/15 1203 04/26/15 1216 04/27/15 0300 04/28/15 0220  NA 140 138 138 138  K 4.9 5.4* 4.0 3.7  CL 102 101 106 107  CO2 26  --  22 24  BUN 9 12 8 6   CREATININE 0.75 0.70 0.68 0.72  CALCIUM 9.4  --  8.5* 8.8*  PROT 6.7  --   --   --   BILITOT 1.2  --   --   --   ALKPHOS 59  --   --   --   ALT 25  --   --   --   AST 27  --   --   --   GLUCOSE 98 91 96 123*   Lipid Panel     Component Value Date/Time   CHOL 206* 04/27/2015 0300   TRIG 105 04/27/2015 0300   HDL 83 04/27/2015 0300   CHOLHDL 2.5 04/27/2015 0300   VLDL 21 04/27/2015 0300   LDLCALC 102* 04/27/2015 0300   Imaging/Diagnostic Tests: Ct Angio Head W/cm &/or Wo Cm  04/26/2015  CLINICAL DATA:  RIGHT-sided weakness.  Unknown last seen normal. EXAM: CT ANGIOGRAPHY HEAD AND NECK TECHNIQUE: Multidetector CT imaging of the head and neck was performed using the standard protocol during bolus administration of intravenous contrast. Multiplanar CT image reconstructions and MIPs were obtained to evaluate the vascular anatomy. Carotid stenosis measurements (when applicable) are obtained utilizing NASCET criteria, using the distal internal carotid diameter as the denominator. CONTRAST:  80mL OMNIPAQUE IOHEXOL 350 MG/ML SOLN COMPARISON:  Noncontrast CT head 04/26/2015. FINDINGS: The study was marred by technical difficulties. For the first injection, the wrong vessel was chosen for triggering (pulmonary artery instead of descending thoracic aorta). For the second run, the tubing  became disconnected and an unknown amount of contrast spilled on the floor. Instead of performing a third injection, MRA intracranial will be performed to correlate with the findings described below. CT HEAD Calvarium and skull base: No fracture or destructive lesion. Mastoids and middle ears are grossly clear. Paranasal sinuses: Imaged portions are clear. Orbits: Negative. Brain: Patchy areas of hypoattenuation throughout the LEFT hemisphere, including the insula, frontal, temporal, and parietal lobes and adjacent subcortical white matter, nonhemorrhagic. No mass lesion or midline shift. No hydrocephalus or extra-axial fluid. CTA NECK Aortic arch: Standard branching. Imaged portion shows no evidence of aneurysm or dissection. No significant stenosis of the major arch vessel origins. Moderate atheromatous calcification is non stenotic. Right carotid system: Calcific plaque without significant narrowing of the lumen. No ulceration. No evidence of dissection, stenosis (50% or greater) or occlusion. Left carotid system: Calcific plaque without significant narrowing of the lumen. No ulceration. No evidence of dissection, stenosis (50% or greater) or occlusion. Vertebral arteries: Codominant.  No evidence of dissection, stenosis (50% or greater) or occlusion. Ostial plaque bilaterally. CTA HEAD Anterior circulation: Calcification in the carotid siphons, and supraclinoid segments, greater on the LEFT, not clearly flow reducing. No M1 stenosis on the RIGHT and LEFT. No significant stenosis, proximal occlusion, aneurysm, or vascular malformation. 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 No significant stenosis, proximal occlusion, aneurysm, or vascular malformation. Venous sinuses: As permitted by contrast timing, patent. Anatomic variants: BILATERAL fetal PCA. Delayed phase:   No abnormal intracranial enhancement. IMPRESSION: Technically poor quality CTA head neck. The study is  sufficiently diagnostic to exclude a flow reducing extracranial stenosis or intracranial large vessel occlusion. Multifocal areas of nonhemorrhagic LEFT MCA territory infarction are suspected. MRI brain and intracranial MRA is pending. Electronically Signed   By: Elsie Stain M.D.   On: 04/26/2015 15:32   Ct Head Wo Contrast  04/27/2015  CLINICAL DATA:  50 year old male with altered mental status EXAM: CT HEAD WITHOUT CONTRAST TECHNIQUE: Contiguous axial images were obtained from the base of the skull through the vertex without intravenous contrast. COMPARISON:  Brain CT and MRI dated 04/26/2015 FINDINGS: There are areas of cortical and subcortical hypodensity involving the left frontal, temporal, and parietal lobe in the left MCA territory distribution compatible with ischemic infarct. There has been interval increase and conspicuity of these hypodensities compared the prior CT compatible with edema. There is no acute intracranial hemorrhage. Ventricles and sulci are appropriate in size for patient's age. There is no significant mass effect. No midline shift. The visualized paranasal sinuses and mastoid air cells are well aerated. The calvarium is intact. IMPRESSION: No acute intracranial hemorrhage. Large left MCA territory ischemic infarct. Electronically Signed   By: Elgie Collard M.D.   On: 04/27/2015 01:07   Ct Head Wo Contrast  04/26/2015  CLINICAL DATA:  Weakness, facial droop and aphasia EXAM: CT HEAD WITHOUT CONTRAST TECHNIQUE: Contiguous axial images were obtained from the base of the skull through the vertex without intravenous contrast. COMPARISON:  None. FINDINGS: There is low-attenuation within the LEFT insular ribbon and external capsule (image 14, series 2) LEFT middle cerebral artery is prominent with high-density (image 10 and 11 of series 2) which is less specific. There is no intracranial hemorrhage. No midline shift or mass effect. Paranasal sinuses and  mastoid air cells are clear.  IMPRESSION: 1. Acute infarction involving the LEFT insular ribbon and external capsule. 2. Potential thrombus within the LEFT middle cerebral artery. Findings conveyed to: ZAMMIT, JOSEPHon 04/26/2015  at13:06. Electronically Signed   By: Genevive Bi M.D.   On: 04/26/2015 13:08   Ct Angio Neck W/cm &/or Wo/cm  04/26/2015  CLINICAL DATA:  RIGHT-sided weakness.  Unknown last seen normal. EXAM: CT ANGIOGRAPHY HEAD AND NECK TECHNIQUE: Multidetector CT imaging of the head and neck was performed using the standard protocol during bolus administration of intravenous contrast. Multiplanar CT image reconstructions and MIPs were obtained to evaluate the vascular anatomy. Carotid stenosis measurements (when applicable) are obtained utilizing NASCET criteria, using the distal internal carotid diameter as the denominator. CONTRAST:  80mL OMNIPAQUE IOHEXOL 350 MG/ML SOLN COMPARISON:  Noncontrast CT head 04/26/2015. FINDINGS: The study was marred by technical difficulties. For the first injection, the wrong vessel was chosen for triggering (pulmonary artery instead of descending thoracic aorta). For the second run, the tubing became disconnected and an unknown amount of contrast spilled on the floor. Instead of performing a third injection, MRA intracranial will be performed to correlate with the findings  described below. CT HEAD Calvarium and skull base: No fracture or destructive lesion. Mastoids and middle ears are grossly clear. Paranasal sinuses: Imaged portions are clear. Orbits: Negative. Brain: Patchy areas of hypoattenuation throughout the LEFT hemisphere, including the insula, frontal, temporal, and parietal lobes and adjacent subcortical white matter, nonhemorrhagic. No mass lesion or midline shift. No hydrocephalus or extra-axial fluid. CTA NECK Aortic arch: Standard branching. Imaged portion shows no evidence of aneurysm or dissection. No significant stenosis of the major arch vessel origins. Moderate  atheromatous calcification is non stenotic. Right carotid system: Calcific plaque without significant narrowing of the lumen. No ulceration. No evidence of dissection, stenosis (50% or greater) or occlusion. Left carotid system: Calcific plaque without significant narrowing of the lumen. No ulceration. No evidence of dissection, stenosis (50% or greater) or occlusion. Vertebral arteries: Codominant. No evidence of dissection, stenosis (50% or greater) or occlusion. Ostial plaque bilaterally. CTA HEAD Anterior circulation: Calcification in the carotid siphons, and supraclinoid segments, greater on the LEFT, not clearly flow reducing. No M1 stenosis on the RIGHT and LEFT. No significant stenosis, proximal occlusion, aneurysm, or vascular malformation. 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 No significant stenosis, proximal occlusion, aneurysm, or vascular malformation. Venous sinuses: As permitted by contrast timing, patent. Anatomic variants: BILATERAL fetal PCA. Delayed phase:   No abnormal intracranial enhancement. IMPRESSION: Technically poor quality CTA head neck. The study is sufficiently diagnostic to exclude a flow reducing extracranial stenosis or intracranial large vessel occlusion. Multifocal areas of nonhemorrhagic LEFT MCA territory infarction are suspected. MRI brain and intracranial MRA is pending. Electronically Signed   By: Elsie Stain M.D.   On: 04/26/2015 15:32   Mr Maxine Glenn Head Wo Contrast  04/26/2015  CLINICAL DATA:  RIGHT-sided weakness.  Unknown last seen normal. EXAM: MRI HEAD WITHOUT CONTRAST MRA HEAD WITHOUT CONTRAST TECHNIQUE: Multiplanar, multiecho pulse sequences of the brain and surrounding structures were obtained without intravenous contrast. Angiographic images of the head were obtained using MRA technique without contrast. COMPARISON:  CT head, CT angio head neck, earlier today. FINDINGS: MRI HEAD FINDINGS The patient was unable to remain  motionless for the exam. Small or subtle lesions could be overlooked. Extensive gyriform restricted diffusion throughout much of the LEFT MCA territory, including frontal, temporal, insular, and parietal lobes, as well as adjacent subcortical white matter. No definite hemorrhagic transformation. Generalized atrophy premature for age. Minor white matter disease, nonspecific. No midline abnormalities. Flow voids are maintained. No extracranial soft tissue abnormalities of significance. MRA HEAD FINDINGS The internal carotid arteries are widely patent. The RIGHT anterior cerebral artery is severely diseased, and both anterior cerebral is are predominantly supplied from the LEFT. The RIGHT MCA trunk and branches are normal. On the LEFT, distal M1 segment, there appears to be a flow reducing lesion. Patient motion degrades the axial source images. It is not clear from interrogation of those axial source images, as well as correlation with CTA, if this represents a severe stenosis or intraluminal filling defect. See for instance image 14 series 501, as well as image 98, series 5. Decreased size and number of distal LEFT MCA branches; the distal M1 lesion is nonocclusive. The basilar artery is supplied predominantly by the LEFT vertebral. BILATERAL fetal PCA segments. Mid basilar stenosis approaches 50%. IMPRESSION: Extensive areas of acute nonhemorrhagic infarction throughout much of the LEFT MCA territory, with sparing of the basal ganglia. No proximal large vessel occlusion, but there is either a focal high-grade stenosis or intraluminal filling defect in  the LEFT MCA, distal M1 segment. This is not well seen on CTA. Mid basilar stenosis estimated 50%, along with basilar hypoplasia due to fetal PCA segments. Dominant LEFT vertebral. Electronically Signed   By: Elsie StainJohn T Curnes M.D.   On: 04/26/2015 17:00   Mr Brain Wo Contrast  04/26/2015  CLINICAL DATA:  RIGHT-sided weakness.  Unknown last seen normal. EXAM: MRI HEAD  WITHOUT CONTRAST MRA HEAD WITHOUT CONTRAST TECHNIQUE: Multiplanar, multiecho pulse sequences of the brain and surrounding structures were obtained without intravenous contrast. Angiographic images of the head were obtained using MRA technique without contrast. COMPARISON:  CT head, CT angio head neck, earlier today. FINDINGS: MRI HEAD FINDINGS The patient was unable to remain motionless for the exam. Small or subtle lesions could be overlooked. Extensive gyriform restricted diffusion throughout much of the LEFT MCA territory, including frontal, temporal, insular, and parietal lobes, as well as adjacent subcortical white matter. No definite hemorrhagic transformation. Generalized atrophy premature for age. Minor white matter disease, nonspecific. No midline abnormalities. Flow voids are maintained. No extracranial soft tissue abnormalities of significance. MRA HEAD FINDINGS The internal carotid arteries are widely patent. The RIGHT anterior cerebral artery is severely diseased, and both anterior cerebral is are predominantly supplied from the LEFT. The RIGHT MCA trunk and branches are normal. On the LEFT, distal M1 segment, there appears to be a flow reducing lesion. Patient motion degrades the axial source images. It is not clear from interrogation of those axial source images, as well as correlation with CTA, if this represents a severe stenosis or intraluminal filling defect. See for instance image 14 series 501, as well as image 98, series 5. Decreased size and number of distal LEFT MCA branches; the distal M1 lesion is nonocclusive. The basilar artery is supplied predominantly by the LEFT vertebral. BILATERAL fetal PCA segments. Mid basilar stenosis approaches 50%. IMPRESSION: Extensive areas of acute nonhemorrhagic infarction throughout much of the LEFT MCA territory, with sparing of the basal ganglia. No proximal large vessel occlusion, but there is either a focal high-grade stenosis or intraluminal filling  defect in the LEFT MCA, distal M1 segment. This is not well seen on CTA. Mid basilar stenosis estimated 50%, along with basilar hypoplasia due to fetal PCA segments. Dominant LEFT vertebral. Electronically Signed   By: Elsie StainJohn T Curnes M.D.   On: 04/26/2015 17:00   Almon Herculesaye T Gonfa, MD 04/28/2015, 8:34 AM PGY-3, [facility] FPTS Intern pager: [phone], text pages welcome

## 2015-04-29 ENCOUNTER — Inpatient Hospital Stay (HOSPITAL_COMMUNITY): Payer: Medicaid Other

## 2015-04-29 ENCOUNTER — Encounter (HOSPITAL_COMMUNITY)
Admission: EM | Disposition: A | Discharge status: Rehabilitation Facility | Payer: Self-pay | Source: Home / Self Care | Attending: Family Medicine

## 2015-04-29 ENCOUNTER — Encounter (HOSPITAL_COMMUNITY): Payer: Self-pay | Admitting: *Deleted

## 2015-04-29 DIAGNOSIS — I69398 Other sequelae of cerebral infarction: Secondary | ICD-10-CM

## 2015-04-29 DIAGNOSIS — R269 Unspecified abnormalities of gait and mobility: Secondary | ICD-10-CM

## 2015-04-29 DIAGNOSIS — I639 Cerebral infarction, unspecified: Secondary | ICD-10-CM

## 2015-04-29 HISTORY — PX: TEE WITHOUT CARDIOVERSION: SHX5443

## 2015-04-29 LAB — URIC ACID: URIC ACID, SERUM: 4.6 mg/dL (ref 4.4–7.6)

## 2015-04-29 LAB — SEDIMENTATION RATE: SED RATE: 24 mm/h — AB (ref 0–16)

## 2015-04-29 LAB — HIV ANTIBODY (ROUTINE TESTING W REFLEX): HIV SCREEN 4TH GENERATION: NONREACTIVE

## 2015-04-29 LAB — RPR: RPR: NONREACTIVE

## 2015-04-29 LAB — ANTINUCLEAR ANTIBODIES, IFA: ANA Ab, IFA: NEGATIVE

## 2015-04-29 SURGERY — ECHOCARDIOGRAM, TRANSESOPHAGEAL
Anesthesia: Moderate Sedation

## 2015-04-29 MED ORDER — FENTANYL CITRATE (PF) 100 MCG/2ML IJ SOLN
INTRAMUSCULAR | Status: DC | PRN
  Administered 2015-04-29 (×2): 25 ug via INTRAVENOUS

## 2015-04-29 MED ORDER — FENTANYL CITRATE (PF) 100 MCG/2ML IJ SOLN
INTRAMUSCULAR | Status: AC
  Filled 2015-04-29: qty 2

## 2015-04-29 MED ORDER — MIDAZOLAM HCL 5 MG/ML IJ SOLN
INTRAMUSCULAR | Status: AC
  Filled 2015-04-29: qty 2

## 2015-04-29 MED ORDER — DIPHENHYDRAMINE HCL 50 MG/ML IJ SOLN
INTRAMUSCULAR | Status: AC
  Filled 2015-04-29: qty 1

## 2015-04-29 MED ORDER — NICOTINE 14 MG/24HR TD PT24
14.0000 mg | MEDICATED_PATCH | Freq: Every day | TRANSDERMAL | Status: DC
  Administered 2015-04-29 – 2015-05-01 (×3): 14 mg via TRANSDERMAL
  Filled 2015-04-29 (×3): qty 1

## 2015-04-29 MED ORDER — BUTAMBEN-TETRACAINE-BENZOCAINE 2-2-14 % EX AERO
INHALATION_SPRAY | CUTANEOUS | Status: DC | PRN
  Administered 2015-04-29: 2 via TOPICAL

## 2015-04-29 MED ORDER — MIDAZOLAM HCL 10 MG/2ML IJ SOLN
INTRAMUSCULAR | Status: DC | PRN
  Administered 2015-04-29 (×2): 2 mg via INTRAVENOUS

## 2015-04-29 MED ORDER — VITAMIN B-1 100 MG PO TABS
100.0000 mg | ORAL_TABLET | Freq: Every day | ORAL | Status: DC
  Administered 2015-04-29 – 2015-05-01 (×3): 100 mg via ORAL
  Filled 2015-04-29 (×3): qty 1

## 2015-04-29 MED ORDER — ASPIRIN 325 MG PO TABS
325.0000 mg | ORAL_TABLET | Freq: Every day | ORAL | Status: DC
  Administered 2015-04-29 – 2015-05-01 (×3): 325 mg via ORAL
  Filled 2015-04-29 (×3): qty 1

## 2015-04-29 NOTE — Clinical Social Work Note (Signed)
CSW attempted to speak to patient regarding alternative plan for SNF if patient is not accepted to [facility], patient requested that CSW come at a later time once his wife has arrived.  Ervin Knack. Kamrie Fanton, MSW, Theresia Majors [phone] 04/29/2015 5:02 PM

## 2015-04-29 NOTE — Progress Notes (Signed)
STROKE TEAM PROGRESS NOTE   HISTORY DOCUMENTED AT THE TIME OF INITIAL ASSESSMENT Gary Barker is a 50 y.o. male who was last seen in his normal state around 5 or 6 PM last night. He was then seen to be abnormal around 9 PM. His wife works third shift, this morning when his wife saw him around 7 AM, he was asking inappropriate questions and dragging his right foot. She therefore brought him into the emergency room where CT head was performed which shows patchy left MCA territory infarct.  Of note, he states that he gets inflamed joints from time to time, and recently had inflamed bilateral thumb joints and is on prednisone for what I presume is gout.  LKW: 9 PM tpa given?: no, out of window   SUBJECTIVE (INTERVAL HISTORY) He is awaiting TEE in procedure room.   He continues to have significant aphasia.    OBJECTIVE Temp:  [97.7 F (36.5 C)-99.8 F (37.7 C)] 97.7 F (36.5 C) (01/17 1156) Pulse Rate:  [67-94] 81 (01/17 1300) Cardiac Rhythm:  [-] Normal sinus rhythm (01/17 0800) Resp:  [9-23] 15 (01/17 1300) BP: (141-230)/(81-127) 156/100 mmHg (01/17 1300) SpO2:  [95 %-100 %] 100 % (01/17 1300)  CBC:  Recent Labs Lab 04/26/15 1203  04/27/15 0300 04/28/15 0220  WBC 7.5  --  7.8 8.0  NEUTROABS 4.7  --   --   --   HGB 16.4  < > 15.8 15.2  HCT 46.5  < > 45.2 44.1  MCV 93.6  --  94.2 93.0  PLT 193  --  206 196  < > = values in this interval not displayed.  Basic Metabolic Panel:  Recent Labs Lab 04/26/15 1203  04/27/15 0300 04/28/15 0220  NA 140  < > 138 138  K 4.9  < > 4.0 3.7  CL 102  < > 106 107  CO2 26  --  22 24  GLUCOSE 98  < > 96 123*  BUN 9  < > 8 6  CREATININE 0.75  < > 0.68 0.72  CALCIUM 9.4  --  8.5* 8.8*  MG 2.6*  --   --   --   < > = values in this interval not displayed.  Lipid Panel:     Component Value Date/Time   CHOL 206* 04/27/2015 0300   TRIG 105 04/27/2015 0300   HDL 83 04/27/2015 0300   CHOLHDL 2.5 04/27/2015 0300   VLDL 21 04/27/2015 0300    LDLCALC 102* 04/27/2015 0300   HgbA1c:  Lab Results  Component Value Date   HGBA1C 6.5* 04/27/2015   Urine Drug Screen:     Component Value Date/Time   LABOPIA NONE DETECTED 04/26/2015 1211   COCAINSCRNUR NONE DETECTED 04/26/2015 1211   LABBENZ NONE DETECTED 04/26/2015 1211   AMPHETMU NONE DETECTED 04/26/2015 1211   THCU NONE DETECTED 04/26/2015 1211   LABBARB NONE DETECTED 04/26/2015 1211      IMAGING  Ct Angio Head W/cm &/or Wo Cm 04/26/2015   Technically poor quality CTA head neck. The study is sufficiently diagnostic to exclude a flow reducing extracranial stenosis or intracranial large vessel occlusion. Multifocal areas of nonhemorrhagic LEFT MCA territory infarction are suspected. MRI brain and intracranial MRA is pending.   Ct Head Wo Contrast 04/27/2015   No acute intracranial hemorrhage. Large left MCA territory ischemic infarct.   Ct Head Wo Contrast 04/26/2015   1. Acute infarction involving the LEFT insular ribbon and external capsule.  2. Potential thrombus  within the LEFT middle cerebral artery.    Ct Angio Neck W/cm &/or Wo/cm 04/26/2015   Technically poor quality CTA head neck. The study is sufficiently diagnostic to exclude a flow reducing extracranial stenosis or intracranial large vessel occlusion. Multifocal areas of nonhemorrhagic LEFT MCA territory infarction are suspected. MRI brain and intracranial MRA is pending.   Mr Shirlee Latch Wo Contrast 04/26/2015   Extensive areas of acute nonhemorrhagic infarction throughout much of the LEFT MCA territory, with sparing of the basal ganglia. No proximal large vessel occlusion, but there is either a focal high-grade stenosis or intraluminal filling defect in the LEFT MCA, distal M1 segment. This is not well seen on CTA. Mid basilar stenosis estimated 50%, along with basilar hypoplasia due to fetal PCA segments. Dominant LEFT vertebral.     PHYSICAL EXAM Constitutional: Appears well-developed and well-nourished.   Psych: Affect appropriate to situation Eyes: No scleral injection HENT: No OP obstrucion Head: Normocephalic.  Cardiovascular: Normal rate and regular rhythm.  Respiratory: Effort normal and breath sounds normal to anterior ascultation GI: Soft. No distension. There is no tenderness.  Skin: WDI  Neuro: Mental Status: Patient is awake.  Patient is no longer able to give a clear and coherent history. He has global aphasia. He is able to spontaneously speak in sentences e.g. "I am fine." However, he does not follow commands Has some echolalia  Cranial Nerves: II: PERRL; VF decreased to threat from right.    III,IV, VI: EOMI without ptosis or diploplia.  V: Facial sensation difficult to assess VII: Facial movement is symmetric.  VIII: hearing is intact to voice XII: tongue is midline without atrophy or fasciculations.   Motor: Tone is normal. Bulk is normal. Mild right grip weakness. Diminished fine finger movements on the right. Orbits left over right upper extremity..  No significant lower extremity weakness Sensory:  Sensation is symmetric to pinprick  Cerebellar: Deferred  ASSESSMENT/PLAN [name] is a 50 y.o. male with suspected history of  Gout, asthma, tobacco use, and alcohol abuse presenting with altered mental status and right lower extremity weakness. He did not receive IV t-PA due to late presentation.   Stroke:  Dominant infarct possibly thromboembolic. Source yet undetermined  Resultant  Global aphasia and minimal right hemiparesis  MRI - Extensive areas of acute nonhemorrhagic infarction throughout much of the LEFT MCA territory,  MRA -  focal high-grade stenosis or intraluminal filling defect in the LEFT MCA, distal M1 segment.  Carotid Doppler - refer to CTA of the neck.  2D Echo - Left ventricle: The cavity size was normal. Wall thickness was normal. Systolic function was normal. The estimated ejection fraction was in the range of 55% to  60%. Wall motion was normal; there were no regional wall motion abnormalities  LDL - 102  HgbA1c 6.5  VTE prophylaxis - Lovenox Diet regular Room service appropriate?: Yes; Fluid consistency:: Thin  No antithrombotic prior to admission, now on aspirin 300 mg suppository daily  Patient counseled to be compliant with his antithrombotic medications  Ongoing aggressive stroke risk factor management  Therapy recommendations: Pending  Disposition: Pending  Hypertension  Blood pressure is elevated  Permissive hypertension (OK if < 220/120) but gradually normalize in 5-7 days  Hyperlipidemia  Home meds: No lipid lowering medications prior to admission  LDL 102, goal < 70  Recommend Lipitor 40 mg daily  Continue statin at discharge  Other Stroke Risk Factors  Cigarette smoker, advised to stop smoking  Heavy ETOH use per  family  Gout  Other Active Problems  ETOH history - PRN Ativan.  On CIWA protocol  Asthma Hx. - Has PRN MDI.  Hospital day # 3         ATTENDING NOTE: Patient was seen and examined by me personally. Documentation reflects findings. The laboratory and radiographic studies reviewed by me. ROS pertinent positives could not be fully documented due to aphasia     Assessment and plan completed by me personally and fully documented above. Plans/Recommendations include:     Monitor closely for neurological worsening  Continue stroke work-up  Thiamine, folate, CIWA protocol   Therapy recommendations pending but likley need rehab. Check transesophageal echo for PFO or cardiac source of embolism  follow hypercoagulable and vasculitic labs  D/w   primary team  SIGNED BY: Dr. Delia Heady, MD   To contact Stroke Continuity provider, please refer to [email or web address]. After hours, contact General Neurology

## 2015-04-29 NOTE — H&P (View-Only) (Signed)
Physical Medicine and Rehabilitation Consult  Reason for Consult: Right sided weakness, speech difficulty, cognitive deficits Referring Physician: Dr. Jennette Kettle  HPI: Gary Barker is a 50 y.o. male with history of tobacco use, recent gout flare and heavy alcohol use who was admitted on  04/26/15 am with right sided weakness and confusion. He was last seen normal the night before.  CTA head/neck limited but excluded large vessel occlusion. MRI/MRA brain done revealing extensive areas of non-hemorrhagic infarct throughout much of L-MCA territory sparing basal ganglia, focal high grade stenosis or filling defect in L-MCA distal M1 segment and approximately 50%  mid basilar stenosis. 2D echo with EF 55-60% with no wall abnormality and grade 1 diastolic dysfunction. Swallow evaluation without signs of aspiration and patient placed on dysphagia 3, thin liquids.  Cognitive evaluation shows mixed transcortical aphasia impacting expressive and receptive language with 25% accuracy for basic Y/N questions and ability to follow commands. PT evaluation completed today revealing right knee instability with ataxia, staggering gait and impaired cognition impacting safety awareness.  Neurology recommended ASA for stroke prevention and TEE ordered for work up.    Review of Systems  Unable to perform ROS: language      History reviewed. No pertinent past medical history.    History reviewed. No pertinent past surgical history.    History reviewed. No pertinent family history.    Social History:   Lives with family.  reports that he has never smoked. He does not have any smokeless tobacco history on file. He reports that he drinks alcohol. He reports that he does not use illicit drugs.    Allergies: No Known Allergies    Medications Prior to Admission  Medication Sig Dispense Refill  . albuterol-ipratropium (COMBIVENT) 18-103 MCG/ACT inhaler Inhale 1-2 puffs into the lungs every 6 (six) hours as needed  for wheezing or shortness of breath.    . predniSONE (DELTASONE) 10 MG tablet Take 10 mg by mouth See admin instructions.       Home: Home Living Family/patient expects to be discharged to:: Private residence Living Arrangements: Spouse/significant other, Children (42 year old and 16 year old daughters) Available Help at Discharge: Family, Available PRN/intermittently (wife works 11pm to 7am; extended family lives out of town) Type of Home: House Home Access: Stairs to enter Secretary/administrator of Steps: 4 Entrance Stairs-Rails: None Home Layout: Two level, Bed/bath upstairs Alternate Level Stairs-Number of Steps: 11 Alternate Level Stairs-Rails: Right Bathroom Shower/Tub: Engineer, manufacturing systems: Standard Bathroom Accessibility: Yes Home Equipment: None  Lives With: Family  Functional History: Prior Function Level of Independence: Independent Comments: Pt works daytime at [employer]  Had recently been off due to gout flareup. Family worried about money.  Sister in from [place] and she is asking about getting financial help for this family.  Asked CM for SW consult.  Functional Status:  Mobility: Bed Mobility Overal bed mobility: Needs Assistance Bed Mobility: Supine to Sit Supine to sit: Min guard General bed mobility comments: Close guard assist for safety as pt with decr awareness of safety with mobility.   Transfers Overall transfer level: Needs assistance Equipment used: None Transfers: Sit to/from Stand Sit to Stand: Min assist General transfer comment: Pt needs min assist for steadying as he is slightly impulsive with right LE poor coordination and weakness.   Ambulation/Gait Ambulation/Gait assistance: Min assist, Mod assist Ambulation Distance (Feet): 110 Feet Assistive device: None Gait Pattern/deviations: Step-through pattern, Decreased step length - right, Decreased dorsiflexion - right,  Decreased stance time - right, Decreased weight shift to right, Ataxic,  Staggering left, Staggering right General Gait Details: Pt able to ambulate but needing min assist to mod assist at times as he has right knee instability and decr safety as he is unaware of deficits decreasing his safety with mobility tasks.  Pt at high risk of falls due to weakness right hemibody and cognitive issues affecting safety. Pt unable to express his thoughts due to aphasia as well.  Overall, very impaired cognitively and functionally.   Gait velocity interpretation: Below normal speed for age/gender    ADL:    Cognition: Cognition Overall Cognitive Status: Difficult to assess (aphasia) Arousal/Alertness: Awake/alert Orientation Level: Other (comment) (Oriented to hospital and self clearly- more clear than AM) Attention: Selective Selective Attention: Impaired Selective Attention Impairment: Verbal basic, Functional basic Safety/Judgment: Impaired Cognition Arousal/Alertness: Awake/alert Behavior During Therapy: Flat affect Overall Cognitive Status: Difficult to assess (aphasia) Difficult to assess due to:  (significant expressive and receptive aphasia)  Blood pressure 171/110, pulse 93, temperature 98.8 F (37.1 C), temperature source Oral, resp. rate 19, height 5\' 9"  (1.753 m), weight 61 kg (134 lb 7.7 oz), SpO2 100 %. Physical Exam  Vitals reviewed. Constitutional: He appears well-developed and well-nourished.  HENT:  Head: Normocephalic and atraumatic.  Eyes: Conjunctivae and EOM are normal.  Neck: Normal range of motion. Neck supple.  Cardiovascular: Normal rate and regular rhythm.   Respiratory: Effort normal and breath sounds normal.  GI: Soft. Bowel sounds are normal.  Musculoskeletal: He exhibits no edema or tenderness.  Neurological: He is alert.  Mixed transcortical aphasia DTRs 3+ RLE Neg clonus, Neg hoffman's Motor: >/4/5 throughout Unable to accurately assess MMT and sensation due to aphasia  Skin: Skin is warm and dry.  Psychiatric: His affect is  inappropriate. His speech is delayed and tangential. Cognition and memory are impaired. He expresses inappropriate judgment.    Results for orders placed or performed during the hospital encounter of 04/26/15 (from the past 24 hour(s))  Folate     Status: None   Collection Time: 04/27/15  3:55 PM  Result Value Ref Range   Folate 69.7 >5.9 ng/mL  Vitamin B12     Status: None   Collection Time: 04/27/15  3:55 PM  Result Value Ref Range   Vitamin B-12 330 180 - 914 pg/mL  CBC     Status: None   Collection Time: 04/28/15  2:20 AM  Result Value Ref Range   WBC 8.0 4.0 - 10.5 K/uL   RBC 4.74 4.22 - 5.81 MIL/uL   Hemoglobin 15.2 13.0 - 17.0 g/dL   HCT 78.2 95.6 - 21.3 %   MCV 93.0 78.0 - 100.0 fL   MCH 32.1 26.0 - 34.0 pg   MCHC 34.5 30.0 - 36.0 g/dL   RDW 08.6 57.8 - 46.9 %   Platelets 196 150 - 400 K/uL  Basic metabolic panel     Status: Abnormal   Collection Time: 04/28/15  2:20 AM  Result Value Ref Range   Sodium 138 135 - 145 mmol/L   Potassium 3.7 3.5 - 5.1 mmol/L   Chloride 107 101 - 111 mmol/L   CO2 24 22 - 32 mmol/L   Glucose, Bld 123 (H) 65 - 99 mg/dL   BUN 6 6 - 20 mg/dL   Creatinine, Ser 6.29 0.61 - 1.24 mg/dL   Calcium 8.8 (L) 8.9 - 10.3 mg/dL   GFR calc non Af Amer >60 >60 mL/min   GFR calc  Af Amer >60 >60 mL/min   Anion gap 7 5 - 15   Ct Head Wo Contrast  04/27/2015  CLINICAL DATA:  [age] year old male with altered mental status EXAM: CT HEAD WITHOUT CONTRAST TECHNIQUE: Contiguous axial images were obtained from the base of the skull through the vertex without intravenous contrast. COMPARISON:  Brain CT and MRI dated 04/26/2015 FINDINGS: There are areas of cortical and subcortical hypodensity involving the left frontal, temporal, and parietal lobe in the left MCA territory distribution compatible with ischemic infarct. There has been interval increase and conspicuity of these hypodensities compared the prior CT compatible with edema. There is no acute intracranial  hemorrhage. Ventricles and sulci are appropriate in size for patient's age. There is no significant mass effect. No midline shift. The visualized paranasal sinuses and mastoid air cells are well aerated. The calvarium is intact. IMPRESSION: No acute intracranial hemorrhage. Large left MCA territory ischemic infarct. Electronically Signed   By: Elgie CollardArash  Radparvar M.D.   On: 04/27/2015 01:07   Mr Maxine GlennMra Head Wo Contrast  04/26/2015  CLINICAL DATA:  RIGHT-sided weakness.  Unknown last seen normal. EXAM: MRI HEAD WITHOUT CONTRAST MRA HEAD WITHOUT CONTRAST TECHNIQUE: Multiplanar, multiecho pulse sequences of the brain and surrounding structures were obtained without intravenous contrast. Angiographic images of the head were obtained using MRA technique without contrast. COMPARISON:  CT head, CT angio head neck, earlier today. FINDINGS: MRI HEAD FINDINGS The patient was unable to remain motionless for the exam. Small or subtle lesions could be overlooked. Extensive gyriform restricted diffusion throughout much of the LEFT MCA territory, including frontal, temporal, insular, and parietal lobes, as well as adjacent subcortical white matter. No definite hemorrhagic transformation. Generalized atrophy premature for age. Minor white matter disease, nonspecific. No midline abnormalities. Flow voids are maintained. No extracranial soft tissue abnormalities of significance. MRA HEAD FINDINGS The internal carotid arteries are widely patent. The RIGHT anterior cerebral artery is severely diseased, and both anterior cerebral is are predominantly supplied from the LEFT. The RIGHT MCA trunk and branches are normal. On the LEFT, distal M1 segment, there appears to be a flow reducing lesion. Patient motion degrades the axial source images. It is not clear from interrogation of those axial source images, as well as correlation with CTA, if this represents a severe stenosis or intraluminal filling defect. See for instance image 14 series  501, as well as image 98, series 5. Decreased size and number of distal LEFT MCA branches; the distal M1 lesion is nonocclusive. The basilar artery is supplied predominantly by the LEFT vertebral. BILATERAL fetal PCA segments. Mid basilar stenosis approaches 50%. IMPRESSION: Extensive areas of acute nonhemorrhagic infarction throughout much of the LEFT MCA territory, with sparing of the basal ganglia. No proximal large vessel occlusion, but there is either a focal high-grade stenosis or intraluminal filling defect in the LEFT MCA, distal M1 segment. This is not well seen on CTA. Mid basilar stenosis estimated 50%, along with basilar hypoplasia due to fetal PCA segments. Dominant LEFT vertebral. Electronically Signed   By: Elsie StainJohn T Curnes M.D.   On: 04/26/2015 17:00   Mr Brain Wo Contrast  04/26/2015  CLINICAL DATA:  RIGHT-sided weakness.  Unknown last seen normal. EXAM: MRI HEAD WITHOUT CONTRAST MRA HEAD WITHOUT CONTRAST TECHNIQUE: Multiplanar, multiecho pulse sequences of the brain and surrounding structures were obtained without intravenous contrast. Angiographic images of the head were obtained using MRA technique without contrast. COMPARISON:  CT head, CT angio head neck, earlier today. FINDINGS: MRI HEAD FINDINGS The  patient was unable to remain motionless for the exam. Small or subtle lesions could be overlooked. Extensive gyriform restricted diffusion throughout much of the LEFT MCA territory, including frontal, temporal, insular, and parietal lobes, as well as adjacent subcortical white matter. No definite hemorrhagic transformation. Generalized atrophy premature for age. Minor white matter disease, nonspecific. No midline abnormalities. Flow voids are maintained. No extracranial soft tissue abnormalities of significance. MRA HEAD FINDINGS The internal carotid arteries are widely patent. The RIGHT anterior cerebral artery is severely diseased, and both anterior cerebral is are predominantly supplied from  the LEFT. The RIGHT MCA trunk and branches are normal. On the LEFT, distal M1 segment, there appears to be a flow reducing lesion. Patient motion degrades the axial source images. It is not clear from interrogation of those axial source images, as well as correlation with CTA, if this represents a severe stenosis or intraluminal filling defect. See for instance image 14 series 501, as well as image 98, series 5. Decreased size and number of distal LEFT MCA branches; the distal M1 lesion is nonocclusive. The basilar artery is supplied predominantly by the LEFT vertebral. BILATERAL fetal PCA segments. Mid basilar stenosis approaches 50%. IMPRESSION: Extensive areas of acute nonhemorrhagic infarction throughout much of the LEFT MCA territory, with sparing of the basal ganglia. No proximal large vessel occlusion, but there is either a focal high-grade stenosis or intraluminal filling defect in the LEFT MCA, distal M1 segment. This is not well seen on CTA. Mid basilar stenosis estimated 50%, along with basilar hypoplasia due to fetal PCA segments. Dominant LEFT vertebral. Electronically Signed   By: Elsie Stain M.D.   On: 04/26/2015 17:00    Assessment/Plan: Diagnosis: L MCA non-hemorrhagic infarct  Labs and images independently reviewed.  Records reviewed and summated above. Stroke: Continue secondary stroke prophylaxis and Risk Factor Modification listed below:   Antiplatelet therapy:   Blood Pressure Management:  Continue current medication with prn's with permisive HTN per primary team Statin Agent:   Diabetes management:  Follow HbA1c Tobacco abuse:  Cont counseling  1. Does the need for close, 24 hr/day medical supervision in concert with the patient's rehab needs make it unreasonable for this patient to be served in a less intensive setting? Yes  2. Co-Morbidities requiring supervision/potential complications: tobacco use (cont to counsel), recent gout flare (ensure pain does not limit therapies,  cont meds), and heavy alcohol (CIWA, cont to counsel), dysphagia (Cont SLP), HTN (monitor and provide prns in accordance with increased physical exertion and pain) 3. Due to safety, skin/wound care, disease management, medication administration and patient education, does the patient require 24 hr/day rehab nursing? Yes 4. Does the patient require coordinated care of a physician, rehab nurse, PT (1-2 hrs/day, 5 days/week), OT (1-2 hrs/day, 5 days/week) and SLP (1-2 hrs/day, 5 days/week) to address physical and functional deficits in the context of the above medical diagnosis(es)? Yes Addressing deficits in the following areas: balance, endurance, locomotion, strength, transferring, bathing, dressing, feeding, toileting, cognition, speech, language, swallowing and psychosocial support 5. Can the patient actively participate in an intensive therapy program of at least 3 hrs of therapy per day at least 5 days per week? Yes 6. The potential for patient to make measurable gains while on inpatient rehab is excellent 7. Anticipated functional outcomes upon discharge from inpatient rehab are modified independent and supervision  with PT, modified independent and supervision with OT, supervision and min assist with SLP. 8. Estimated rehab length of stay to reach the above functional  goals is: 19-22 days. 9. Does the patient have adequate social supports and living environment to accommodate these discharge functional goals? Potentially 10. Anticipated D/C setting: Home 11. Anticipated post D/C treatments: HH therapy and Home excercise program 12. Overall Rehab/Functional Prognosis: good  RECOMMENDATIONS: This patient's condition is appropriate for continued rehabilitative care in the following setting: Pt and family to discuss if pt will have 24/7 support at discharge.  At present he does not and it is not likely pt will be able to achive a Mod I level of functioning after a short IRF.  It family is able to  provide support would recommend CIR after acute medical workup completed.   Patient has agreed to participate in recommended program. Potentially Note that insurance prior authorization may be required for reimbursement for recommended care.  Comment: Rehab Admissions Coordinator to follow up.  Maryla Morrow, MD 04/28/2015

## 2015-04-29 NOTE — Progress Notes (Signed)
Speech Language Pathology Dysphagia Treatment Patient Details Name: Gary Barker MRN: [id number] DOB: 27-Mar-1966 Today's Date: 04/29/2015 Time: 4098-1191 SLP Time Calculation (min) (ACUTE ONLY): 15 min  Assessment / Plan / Recommendation Clinical Impression    SLP provided skilled observation and upgraded PO trial to advise diet advancement and dysphagia tx d/c. Comprehension deficits noted during repositioning of pt in bed. No evidence of dysphagia and no s/s of penetration/aspiration noted. Timely initiation of the swallow and adequate hyolaryngeal elevation were observed. No cues were necessary to eat/drink.  Pt and daughter were educated re: diet recommendation. Recommend regular diet and thin liquids at this time. No further dysphagia tx is warranted, however will f/u re: aphasia.     Diet Recommendation    Regular diet, thin liquids   SLP Plan Continue with current plan of care (d/c from dysphagia but continue aphasia tx)      Swallowing Goals     General Behavior/Cognition: Alert;Cooperative;Requires cueing Patient Positioning: Upright in bed Oral care provided: N/A HPI: Gary Barker is a 50 y.o. male who was last seen in his normal state around 5 or 6 PM last night. He was then seen to be abnormal around 9 PM. His wife works third shift, this morning when his wife saw him around 7 AM, he was asking inappropriate questions and dragging his right foot. She therefore brought him into the emergency room where CT head was performed which shows patchy left MCA territory infarct. Function has been waxing and waning, though pt passed RN stroke swallow SLP ordered to confirm tolerance. Pt is primary [place] speaking.        Dysphagia Treatment Family/Caregiver Educated: daughter Treatment Methods: Skilled observation;Patient/caregiver education;Upgraded PO texture trial Patient observed directly with PO's: Yes Type of PO's observed: Regular;Thin liquids Feeding: Able to feed self Liquids  provided via: Cup;Straw Oral Phase Signs & Symptoms:  (none found) Pharyngeal Phase Signs & Symptoms:  (none found) Amount of cueing: Modified independent   GO     Gary Barker 04/29/2015, 1:30 PM   Lynita Lombard, Student-SLP

## 2015-04-29 NOTE — Progress Notes (Signed)
STROKE TEAM PROGRESS NOTE   HISTORY DOCUMENTED AT THE TIME OF INITIAL ASSESSMENT Gary Barker is a 50 y.o. male who was last seen in his normal state around 5 or 6 PM last night. He was then seen to be abnormal around 9 PM. His wife works third shift, this morning when his wife saw him around 7 AM, he was asking inappropriate questions and dragging his right foot. She therefore brought him into the emergency room where CT head was performed which shows patchy left MCA territory infarct.  Of note, he states that he gets inflamed joints from time to time, and recently had inflamed bilateral thumb joints and is on prednisone for what I presume is gout.  LKW: 9 PM tpa given?: no, out of window   SUBJECTIVE (INTERVAL HISTORY) His wife, daughter  were at the bedside.   He continues to have significant aphasia.  Discussed diagnosis and prognosis with the family.     OBJECTIVE Temp:  [97.7 F (36.5 C)-99.8 F (37.7 C)] 97.7 F (36.5 C) (01/17 1156) Pulse Rate:  [67-94] 81 (01/17 1300) Cardiac Rhythm:  [-] Normal sinus rhythm (01/17 0800) Resp:  [9-23] 15 (01/17 1300) BP: (141-230)/(81-127) 156/100 mmHg (01/17 1300) SpO2:  [95 %-100 %] 100 % (01/17 1300)  CBC:  Recent Labs Lab 04/26/15 1203  04/27/15 0300 04/28/15 0220  WBC 7.5  --  7.8 8.0  NEUTROABS 4.7  --   --   --   HGB 16.4  < > 15.8 15.2  HCT 46.5  < > 45.2 44.1  MCV 93.6  --  94.2 93.0  PLT 193  --  206 196  < > = values in this interval not displayed.  Basic Metabolic Panel:  Recent Labs Lab 04/26/15 1203  04/27/15 0300 04/28/15 0220  NA 140  < > 138 138  K 4.9  < > 4.0 3.7  CL 102  < > 106 107  CO2 26  --  22 24  GLUCOSE 98  < > 96 123*  BUN 9  < > 8 6  CREATININE 0.75  < > 0.68 0.72  CALCIUM 9.4  --  8.5* 8.8*  MG 2.6*  --   --   --   < > = values in this interval not displayed.  Lipid Panel:     Component Value Date/Time   CHOL 206* 04/27/2015 0300   TRIG 105 04/27/2015 0300   HDL 83 04/27/2015 0300    CHOLHDL 2.5 04/27/2015 0300   VLDL 21 04/27/2015 0300   LDLCALC 102* 04/27/2015 0300   HgbA1c:  Lab Results  Component Value Date   HGBA1C 6.5* 04/27/2015   Urine Drug Screen:     Component Value Date/Time   LABOPIA NONE DETECTED 04/26/2015 1211   COCAINSCRNUR NONE DETECTED 04/26/2015 1211   LABBENZ NONE DETECTED 04/26/2015 1211   AMPHETMU NONE DETECTED 04/26/2015 1211   THCU NONE DETECTED 04/26/2015 1211   LABBARB NONE DETECTED 04/26/2015 1211      IMAGING  Ct Angio Head W/cm &/or Wo Cm 04/26/2015   Technically poor quality CTA head neck. The study is sufficiently diagnostic to exclude a flow reducing extracranial stenosis or intracranial large vessel occlusion. Multifocal areas of nonhemorrhagic LEFT MCA territory infarction are suspected. MRI brain and intracranial MRA is pending.   Ct Head Wo Contrast 04/27/2015   No acute intracranial hemorrhage. Large left MCA territory ischemic infarct.   Ct Head Wo Contrast 04/26/2015   1. Acute infarction involving the  LEFT insular ribbon and external capsule.  2. Potential thrombus within the LEFT middle cerebral artery.    Ct Angio Neck W/cm &/or Wo/cm 04/26/2015   Technically poor quality CTA head neck. The study is sufficiently diagnostic to exclude a flow reducing extracranial stenosis or intracranial large vessel occlusion. Multifocal areas of nonhemorrhagic LEFT MCA territory infarction are suspected. MRI brain and intracranial MRA is pending.   Mr Gary Barker Wo Contrast 04/26/2015   Extensive areas of acute nonhemorrhagic infarction throughout much of the LEFT MCA territory, with sparing of the basal ganglia. No proximal large vessel occlusion, but there is either a focal high-grade stenosis or intraluminal filling defect in the LEFT MCA, distal M1 segment. This is not well seen on CTA. Mid basilar stenosis estimated 50%, along with basilar hypoplasia due to fetal PCA segments. Dominant LEFT vertebral.     PHYSICAL  EXAM Constitutional: Appears well-developed and well-nourished.  Psych: Affect appropriate to situation Eyes: No scleral injection HENT: No OP obstrucion Head: Normocephalic.  Cardiovascular: Normal rate and regular rhythm.  Respiratory: Effort normal and breath sounds normal to anterior ascultation GI: Soft. No distension. There is no tenderness.  Skin: WDI  Neuro: Mental Status: Patient is awake.  Patient is no longer able to give a clear and coherent history. He has global aphasia. He is able to spontaneously speak in sentences e.g. "I am fine." However, he does not follow commands   Cranial Nerves: II: PERRL; VF decreased to threat from right.    III,IV, VI: EOMI without ptosis or diploplia.  V: Facial sensation difficult to assess VII: Facial movement is symmetric.  VIII: hearing is intact to voice XII: tongue is midline without atrophy or fasciculations.   Motor: Tone is normal. Bulk is normal. Mild right grip weakness. Diminished fine finger movements on the right. Orbits left over right upper extremity..  No significant lower extremity weakness Sensory:  Sensation is symmetric to pinprick  Cerebellar: Deferred  ASSESSMENT/PLAN [name] is a 50 y.o. male with suspected history of  Gout, asthma, tobacco use, and alcohol abuse presenting with altered mental status and right lower extremity weakness. He did not receive IV t-PA due to late presentation.   Stroke:  Dominant infarct possibly thromboembolic. Source yet undetermined  Resultant  Global aphasia and minimal right hemiparesis  MRI - Extensive areas of acute nonhemorrhagic infarction throughout much of the LEFT MCA territory,  MRA -  focal high-grade stenosis or intraluminal filling defect in the LEFT MCA, distal M1 segment.  Carotid Doppler - refer to CTA of the neck.  2D Echo - Left ventricle: The cavity size was normal. Wall thickness was normal. Systolic function was normal. The estimated  ejection fraction was in the range of 55% to 60%. Wall motion was normal; there were no regional wall motion abnormalities  LDL - 102  HgbA1c 6.5  VTE prophylaxis - Lovenox Diet regular Room service appropriate?: Yes; Fluid consistency:: Thin  No antithrombotic prior to admission, now on aspirin 300 mg suppository daily  Patient counseled to be compliant with his antithrombotic medications  Ongoing aggressive stroke risk factor management  Therapy recommendations: Pending  Disposition: Pending  Hypertension  Blood pressure is elevated  Permissive hypertension (OK if < 220/120) but gradually normalize in 5-7 days  Hyperlipidemia  Home meds: No lipid lowering medications prior to admission  LDL 102, goal < 70  Recommend Lipitor 40 mg daily  Continue statin at discharge  Other Stroke Risk Factors  Cigarette smoker, advised  to stop smoking  Heavy ETOH use per family  Gout  Other Active Problems  ETOH history - PRN Ativan. Recommend Vitamin B12, Folate, MVI.  Asthma Hx. - Has PRN MDI.  Hospital day # 3         ATTENDING NOTE: Patient was seen and examined by me personally. Documentation reflects findings. The laboratory and radiographic studies reviewed by me. ROS pertinent positives could not be fully documented due to aphasia     Assessment and plan completed by me personally and fully documented above. Plans/Recommendations include:     Monitor closely for neurological worsening  Continue stroke work-up  Thiamine, folate, CIWA protocol  Discussed case with family in great detail, including review of the MRI  Therapy. Check transesophageal echo for PFO or cardiac source of embolism  Check hypercoagulable and vasculitic labs  D/w patient, wife and primary team  SIGNED BY: Dr. Delia Heady, MD   To contact Stroke Continuity provider, please refer to [email or web address]. After hours, contact General Neurology

## 2015-04-29 NOTE — Progress Notes (Signed)
Family Medicine Teaching Service Daily Progress Note Intern Pager: [phone]  Patient name: Gary Barker Medical record number: [id number] Date of birth: Jul 03, 1965 Age: 50 y.o. Gender: male  Primary Care Provider: No primary care provider on file. Consultants: Neuro Code Status: full  Pt Overview and Major Events to Date:  1/14: L MCA CVA  Assessment and Plan: Gary Barker is a 50 y.o. male presenting with ischemic stroke. PMH is significant for heavy drinking, heavy smoking and questionable hypertension.  CVA in LMCA territory: improving based on neuro exam today. Patient with receptive and expressive aphasia consistent with his CT finding. He was outside tpa window on arrival.CT head with acute infarction involving the left insular ribbon and external capsule, and potential thrombus within the left middle cerebral artery. CTA suspicious for multifocal areas of nonhemorrhagic left MCA territory infarction. No sign of infection. UDS negative. EtOH level normal. EKG read as atrial flutter but NSR on further evaluation. Unclear source of stroke at this time. Some concern for multiple emboli breaking from a larger clot source given distribution of infarcts. MRI brain with extensive areas of acute nonhemorrhagic infarction throughout much of the LEFT MCA territory. TEE without thrombosis or abnormality. Lipid panel 102 and HDL 83. TSH within normal. A1c 6.5 - Appreciate neuro recs. Will follow further recs  -monitor closely for cerebral edema - Hypercoagulability labs as outpatient as patient is on Lovenox. - Allow permissive hypertension - SLP/PT/OT eval-rec CIR - Full dose ASA and Atorvastatin 40 mg   Questionable H/o Hypertension: not clear on this. Seen by Dr. Jenne Campus at [facility] for left arm pain on 04/18/2015. Did RA factor which was negative. Started on prednisone. BP at that time 140/80. No other PMH or labs. -allow permissive hypertension for now BP  to 220/120. Will  normalize BP over 5-7 days  Concern for acute polyarticular inflammatory condition: presented to UC on 01/06 with left arm pain. Was given prednisone. No imaging. RA negative. ESR slightly elevated. -Will get ANA, ACCP, antiphospholipid and C-RP for acute polyarticular inflammatory condition.   Screening of infectious disease -f/u HIV, HCV and RPR  H/o asthma?: he is on combivent. Wife says he hasn't used the medication in awhile.  -Albuterol as needed.  Alcohol use disorder: history of heavy drinking per family members. B12 and Folate within normal limit -CIWA. Scores = 3> 2>0. No Ativan given -Recommend EtOH rehab on discharge  Tobacco use disorder: unclear how much he smokes but per family he ' is always outside smoking' -Nicotine patch 14 mg qD  FEN/GI: Dys 3 Diet with thin liquids. NS @ 50 cc/hr. Protonix  Prophylaxis: lovenox   Disposition: transfer to floor with tele. Physical therapy and SLP recommended CIR . However, pt not likely to achive a Mod I level of functioning after a short IRF per CIR. Recommended CIR after acute medical workup completed if he has 24/7 family support.    Subjective:  Patient intermittently able to express himself. But there is improvement per daughter at bedside. Able to follow commands intermittently during exam  Objective: Temp:  [98.3 F (36.8 C)-99.8 F (37.7 C)] 98.3 F (36.8 C) (01/17 0356) Pulse Rate:  [67-94] 78 (01/17 0600) Resp:  [9-23] 9 (01/17 0600) BP: (141-189)/(81-112) 166/91 mmHg (01/17 0600) SpO2:  [95 %-100 %] 95 % (01/17 0600) Physical Exam: Gen: lying in bed, NAD. Eyes: pupils equal, round and reactive to light Oropharynx: clear, moist  CV: regular rate and rythm. S1 & S2 audible, no murmurs.  Resp: normal work of breathing, clear to auscultation bilaterally. GI: bowel sounds normal, no tenderness to palpation Skin: no lesions, normal bilateral hands negative for rash or bony abnormalities MSK: normal bulk,  symmetric Neuro: MS - Awake, alert. Oriented to self, but not person, place, and date. Speech with intermittent expressive and receptive aphasia but improved.  Cranial Nerves - Pupils were equal and reactive (5 to 43m); EOM normal, no nystagmus; no ptosis, face symmetric on smile, tongue protrusion symmetric, sternocleidomastoid and trapezius are with normal strength.  Tone - Normal.  Strength - normal in all muscle group  Reflexes - patellar and ankle reflex 2+ bilaterally Coordination : not able to assess Gait: no pronator drift   Laboratory:  Recent Labs Lab 04/26/15 1203 04/26/15 1216 04/27/15 0300 04/28/15 0220  WBC 7.5  --  7.8 8.0  HGB 16.4 17.3* 15.8 15.2  HCT 46.5 51.0 45.2 44.1  PLT 193  --  206 196    Recent Labs Lab 04/26/15 1203 04/26/15 1216 04/27/15 0300 04/28/15 0220  NA 140 138 138 138  K 4.9 5.4* 4.0 3.7  CL 102 101 106 107  CO2 26  --  22 24  BUN '9 12 8 6  ' CREATININE 0.75 0.70 0.68 0.72  CALCIUM 9.4  --  8.5* 8.8*  PROT 6.7  --   --   --   BILITOT 1.2  --   --   --   ALKPHOS 59  --   --   --   ALT 25  --   --   --   AST 27  --   --   --   GLUCOSE 98 91 96 123*   Lipid Panel     Component Value Date/Time   CHOL 206* 04/27/2015 0300   TRIG 105 04/27/2015 0300   HDL 83 04/27/2015 0300   CHOLHDL 2.5 04/27/2015 0300   VLDL 21 04/27/2015 0300   LDLCALC 102* 04/27/2015 0300   Imaging/Diagnostic Tests: Ct Angio Head W/cm &/or Wo Cm  04/26/2015  CLINICAL DATA:  RIGHT-sided weakness.  Unknown last seen normal. EXAM: CT ANGIOGRAPHY HEAD AND NECK TECHNIQUE: Multidetector CT imaging of the head and neck was performed using the standard protocol during bolus administration of intravenous contrast. Multiplanar CT image reconstructions and MIPs were obtained to evaluate the vascular anatomy. Carotid stenosis measurements (when applicable) are obtained utilizing NASCET criteria, using the distal internal carotid diameter as the denominator. CONTRAST:   850mOMNIPAQUE IOHEXOL 350 MG/ML SOLN COMPARISON:  Noncontrast CT head 04/26/2015. FINDINGS: The study was marred by technical difficulties. For the first injection, the wrong vessel was chosen for triggering (pulmonary artery instead of descending thoracic aorta). For the second run, the tubing became disconnected and an unknown amount of contrast spilled on the floor. Instead of performing a third injection, MRA intracranial will be performed to correlate with the findings described below. CT HEAD Calvarium and skull base: No fracture or destructive lesion. Mastoids and middle ears are grossly clear. Paranasal sinuses: Imaged portions are clear. Orbits: Negative. Brain: Patchy areas of hypoattenuation throughout the LEFT hemisphere, including the insula, frontal, temporal, and parietal lobes and adjacent subcortical white matter, nonhemorrhagic. No mass lesion or midline shift. No hydrocephalus or extra-axial fluid. CTA NECK Aortic arch: Standard branching. Imaged portion shows no evidence of aneurysm or dissection. No significant stenosis of the major arch vessel origins. Moderate atheromatous calcification is non stenotic. Right carotid system: Calcific plaque without significant narrowing of the lumen. No ulceration. No  evidence of dissection, stenosis (50% or greater) or occlusion. Left carotid system: Calcific plaque without significant narrowing of the lumen. No ulceration. No evidence of dissection, stenosis (50% or greater) or occlusion. Vertebral arteries: Codominant. No evidence of dissection, stenosis (50% or greater) or occlusion. Ostial plaque bilaterally. CTA HEAD Anterior circulation: Calcification in the carotid siphons, and supraclinoid segments, greater on the LEFT, not clearly flow reducing. No M1 stenosis on the RIGHT and LEFT. No significant stenosis, proximal occlusion, aneurysm, or vascular malformation. 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 No  significant stenosis, proximal occlusion, aneurysm, or vascular malformation. Venous sinuses: As permitted by contrast timing, patent. Anatomic variants: BILATERAL fetal PCA. Delayed phase:   No abnormal intracranial enhancement. IMPRESSION: Technically poor quality CTA head neck. The study is sufficiently diagnostic to exclude a flow reducing extracranial stenosis or intracranial large vessel occlusion. Multifocal areas of nonhemorrhagic LEFT MCA territory infarction are suspected. MRI brain and intracranial MRA is pending. Electronically Signed   By: Staci Righter M.D.   On: 04/26/2015 15:32   Ct Head Wo Contrast  04/27/2015  CLINICAL DATA:  50 year old male with altered mental status EXAM: CT HEAD WITHOUT CONTRAST TECHNIQUE: Contiguous axial images were obtained from the base of the skull through the vertex without intravenous contrast. COMPARISON:  Brain CT and MRI dated 04/26/2015 FINDINGS: There are areas of cortical and subcortical hypodensity involving the left frontal, temporal, and parietal lobe in the left MCA territory distribution compatible with ischemic infarct. There has been interval increase and conspicuity of these hypodensities compared the prior CT compatible with edema. There is no acute intracranial hemorrhage. Ventricles and sulci are appropriate in size for patient's age. There is no significant mass effect. No midline shift. The visualized paranasal sinuses and mastoid air cells are well aerated. The calvarium is intact. IMPRESSION: No acute intracranial hemorrhage. Large left MCA territory ischemic infarct. Electronically Signed   By: Anner Crete M.D.   On: 04/27/2015 01:07   Ct Head Wo Contrast  04/26/2015  CLINICAL DATA:  Weakness, facial droop and aphasia EXAM: CT HEAD WITHOUT CONTRAST TECHNIQUE: Contiguous axial images were obtained from the base of the skull through the vertex without intravenous contrast. COMPARISON:  None. FINDINGS: There is low-attenuation within the  LEFT insular ribbon and external capsule (image 14, series 2) LEFT middle cerebral artery is prominent with high-density (image 10 and 11 of series 2) which is less specific. There is no intracranial hemorrhage. No midline shift or mass effect. Paranasal sinuses and  mastoid air cells are clear. IMPRESSION: 1. Acute infarction involving the LEFT insular ribbon and external capsule. 2. Potential thrombus within the LEFT middle cerebral artery. Findings conveyed to: ZAMMIT, JOSEPHon 04/26/2015  at13:06. Electronically Signed   By: Suzy Bouchard M.D.   On: 04/26/2015 13:08   Ct Angio Neck W/cm &/or Wo/cm  04/26/2015  CLINICAL DATA:  RIGHT-sided weakness.  Unknown last seen normal. EXAM: CT ANGIOGRAPHY HEAD AND NECK TECHNIQUE: Multidetector CT imaging of the head and neck was performed using the standard protocol during bolus administration of intravenous contrast. Multiplanar CT image reconstructions and MIPs were obtained to evaluate the vascular anatomy. Carotid stenosis measurements (when applicable) are obtained utilizing NASCET criteria, using the distal internal carotid diameter as the denominator. CONTRAST:  63m OMNIPAQUE IOHEXOL 350 MG/ML SOLN COMPARISON:  Noncontrast CT head 04/26/2015. FINDINGS: The study was marred by technical difficulties. For the first injection, the wrong vessel was chosen for triggering (pulmonary artery instead of descending thoracic  aorta). For the second run, the tubing became disconnected and an unknown amount of contrast spilled on the floor. Instead of performing a third injection, MRA intracranial will be performed to correlate with the findings described below. CT HEAD Calvarium and skull base: No fracture or destructive lesion. Mastoids and middle ears are grossly clear. Paranasal sinuses: Imaged portions are clear. Orbits: Negative. Brain: Patchy areas of hypoattenuation throughout the LEFT hemisphere, including the insula, frontal, temporal, and parietal lobes and  adjacent subcortical white matter, nonhemorrhagic. No mass lesion or midline shift. No hydrocephalus or extra-axial fluid. CTA NECK Aortic arch: Standard branching. Imaged portion shows no evidence of aneurysm or dissection. No significant stenosis of the major arch vessel origins. Moderate atheromatous calcification is non stenotic. Right carotid system: Calcific plaque without significant narrowing of the lumen. No ulceration. No evidence of dissection, stenosis (50% or greater) or occlusion. Left carotid system: Calcific plaque without significant narrowing of the lumen. No ulceration. No evidence of dissection, stenosis (50% or greater) or occlusion. Vertebral arteries: Codominant. No evidence of dissection, stenosis (50% or greater) or occlusion. Ostial plaque bilaterally. CTA HEAD Anterior circulation: Calcification in the carotid siphons, and supraclinoid segments, greater on the LEFT, not clearly flow reducing. No M1 stenosis on the RIGHT and LEFT. No significant stenosis, proximal occlusion, aneurysm, or vascular malformation. 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 No significant stenosis, proximal occlusion, aneurysm, or vascular malformation. Venous sinuses: As permitted by contrast timing, patent. Anatomic variants: BILATERAL fetal PCA. Delayed phase:   No abnormal intracranial enhancement. IMPRESSION: Technically poor quality CTA head neck. The study is sufficiently diagnostic to exclude a flow reducing extracranial stenosis or intracranial large vessel occlusion. Multifocal areas of nonhemorrhagic LEFT MCA territory infarction are suspected. MRI brain and intracranial MRA is pending. Electronically Signed   By: Staci Righter M.D.   On: 04/26/2015 15:32   Mr Jodene Nam Head Wo Contrast  04/26/2015  CLINICAL DATA:  RIGHT-sided weakness.  Unknown last seen normal. EXAM: MRI HEAD WITHOUT CONTRAST MRA HEAD WITHOUT CONTRAST TECHNIQUE: Multiplanar, multiecho pulse sequences  of the brain and surrounding structures were obtained without intravenous contrast. Angiographic images of the head were obtained using MRA technique without contrast. COMPARISON:  CT head, CT angio head neck, earlier today. FINDINGS: MRI HEAD FINDINGS The patient was unable to remain motionless for the exam. Small or subtle lesions could be overlooked. Extensive gyriform restricted diffusion throughout much of the LEFT MCA territory, including frontal, temporal, insular, and parietal lobes, as well as adjacent subcortical white matter. No definite hemorrhagic transformation. Generalized atrophy premature for age. Minor white matter disease, nonspecific. No midline abnormalities. Flow voids are maintained. No extracranial soft tissue abnormalities of significance. MRA HEAD FINDINGS The internal carotid arteries are widely patent. The RIGHT anterior cerebral artery is severely diseased, and both anterior cerebral is are predominantly supplied from the LEFT. The RIGHT MCA trunk and branches are normal. On the LEFT, distal M1 segment, there appears to be a flow reducing lesion. Patient motion degrades the axial source images. It is not clear from interrogation of those axial source images, as well as correlation with CTA, if this represents a severe stenosis or intraluminal filling defect. See for instance image 14 series 501, as well as image 98, series 5. Decreased size and number of distal LEFT MCA branches; the distal M1 lesion is nonocclusive. The basilar artery is supplied predominantly by the LEFT vertebral. BILATERAL fetal PCA segments. Mid basilar stenosis approaches 50%. IMPRESSION: Extensive areas  of acute nonhemorrhagic infarction throughout much of the LEFT MCA territory, with sparing of the basal ganglia. No proximal large vessel occlusion, but there is either a focal high-grade stenosis or intraluminal filling defect in the LEFT MCA, distal M1 segment. This is not well seen on CTA. Mid basilar stenosis  estimated 50%, along with basilar hypoplasia due to fetal PCA segments. Dominant LEFT vertebral. Electronically Signed   By: Staci Righter M.D.   On: 04/26/2015 17:00   Mr Brain Wo Contrast  04/26/2015  CLINICAL DATA:  RIGHT-sided weakness.  Unknown last seen normal. EXAM: MRI HEAD WITHOUT CONTRAST MRA HEAD WITHOUT CONTRAST TECHNIQUE: Multiplanar, multiecho pulse sequences of the brain and surrounding structures were obtained without intravenous contrast. Angiographic images of the head were obtained using MRA technique without contrast. COMPARISON:  CT head, CT angio head neck, earlier today. FINDINGS: MRI HEAD FINDINGS The patient was unable to remain motionless for the exam. Small or subtle lesions could be overlooked. Extensive gyriform restricted diffusion throughout much of the LEFT MCA territory, including frontal, temporal, insular, and parietal lobes, as well as adjacent subcortical white matter. No definite hemorrhagic transformation. Generalized atrophy premature for age. Minor white matter disease, nonspecific. No midline abnormalities. Flow voids are maintained. No extracranial soft tissue abnormalities of significance. MRA HEAD FINDINGS The internal carotid arteries are widely patent. The RIGHT anterior cerebral artery is severely diseased, and both anterior cerebral is are predominantly supplied from the LEFT. The RIGHT MCA trunk and branches are normal. On the LEFT, distal M1 segment, there appears to be a flow reducing lesion. Patient motion degrades the axial source images. It is not clear from interrogation of those axial source images, as well as correlation with CTA, if this represents a severe stenosis or intraluminal filling defect. See for instance image 14 series 501, as well as image 98, series 5. Decreased size and number of distal LEFT MCA branches; the distal M1 lesion is nonocclusive. The basilar artery is supplied predominantly by the LEFT vertebral. BILATERAL fetal PCA segments.  Mid basilar stenosis approaches 50%. IMPRESSION: Extensive areas of acute nonhemorrhagic infarction throughout much of the LEFT MCA territory, with sparing of the basal ganglia. No proximal large vessel occlusion, but there is either a focal high-grade stenosis or intraluminal filling defect in the LEFT MCA, distal M1 segment. This is not well seen on CTA. Mid basilar stenosis estimated 50%, along with basilar hypoplasia due to fetal PCA segments. Dominant LEFT vertebral. Electronically Signed   By: Staci Righter M.D.   On: 04/26/2015 17:00   Mercy Riding, MD 04/29/2015, 7:18 AM PGY-1, [facility] Intern pager: [phone], text pages welcome

## 2015-04-29 NOTE — Progress Notes (Signed)
PT Cancellation Note  Patient Details Name: Gary Barker MRN: [id number] DOB: 1965/11/14   Cancelled Treatment:    Reason Eval/Treat Not Completed: Patient at procedure or test/unavailable (Pt at TEE.  Will return as able.  Thanks. )   Cliffton Asters, Beulah Matusek F 04/29/2015, 11:09 AM [facility] [phone] (pager)

## 2015-04-29 NOTE — Progress Notes (Signed)
OT Cancellation Note  Patient Details Name: Gary Barker MRN: [id number] DOB: 1965-05-03   Cancelled Treatment:    Reason Eval/Treat Not Completed: Patient at procedure or test/ unavailable  Boone Master B 04/29/2015, 11:03 AM   [facility]   OTR/L Pager: [phone] Office: [phone] .

## 2015-04-29 NOTE — Progress Notes (Signed)
  Echocardiogram Echocardiogram Transesophageal has been performed.  Nolon Rod 04/29/2015, 10:58 AM

## 2015-04-29 NOTE — CV Procedure (Signed)
    TEE  Indications: CVA  Findings:  No thrombus Negative bubble study, no PFO Normal EF Trace aortic regurgitation.   Donato Schultz, MD

## 2015-04-29 NOTE — Progress Notes (Signed)
Rehab admissions - I met with patient, daughter, and brother at the bedside.  Per family, patient likely has no insurance.  Family will discuss rehab options and then get back to me.  Family not sure who can provide supervision after rehab stay.  I did give family booklets and answer questions about acute inpatient rehab.  Call me for questions.  #[phone]

## 2015-04-29 NOTE — Interval H&P Note (Signed)
History and Physical Interval Note:  04/29/2015 10:11 AM  Gary Barker  has presented today for surgery, with the diagnosis of STROKE  The various methods of treatment have been discussed with the patient and family. After consideration of risks, benefits and other options for treatment, the patient has consented to  Procedure(s): TRANSESOPHAGEAL ECHOCARDIOGRAM (TEE) (N/A) as a surgical intervention .  The patient's history has been reviewed, patient examined, no change in status, stable for surgery.  I have reviewed the patient's chart and labs.  Questions were answered to the patient's satisfaction.     SKAINS, MARK

## 2015-04-29 NOTE — Progress Notes (Signed)
SLP Cancellation Note  Patient Details Name: Gary Barker MRN: [id number] DOB: December 01, 1965   Cancelled treatment:        Pt off unit for TEE. Will continue efforts.   Royce Macadamia 04/29/2015, 10:38 AM   Breck Coons Lonell Face.Ed [employer] [phone]

## 2015-04-29 NOTE — Progress Notes (Signed)
Rehab admissions - I met with patient's wife, sister, brother and daughter.  Wife works nights and patient worked days.  They have a 52 yo and an 52 yo in school.  Family are planning to help so that 24 hour care can be provided after rehab stay.  Family does want CIR rather than SNF.  Family are here from [place] and from [place].  Once patient is medically ready, would be willing to admit to inpatient rehab.  Call me for questions.  #[phone]

## 2015-04-30 ENCOUNTER — Encounter (HOSPITAL_COMMUNITY)
Admission: EM | Disposition: A | Discharge status: Rehabilitation Facility | Payer: Self-pay | Source: Home / Self Care | Attending: Family Medicine

## 2015-04-30 ENCOUNTER — Encounter (HOSPITAL_COMMUNITY): Payer: Self-pay | Admitting: Cardiology

## 2015-04-30 DIAGNOSIS — I639 Cerebral infarction, unspecified: Secondary | ICD-10-CM

## 2015-04-30 DIAGNOSIS — I69991 Dysphagia following unspecified cerebrovascular disease: Secondary | ICD-10-CM

## 2015-04-30 HISTORY — PX: EP IMPLANTABLE DEVICE: SHX172B

## 2015-04-30 LAB — HEPATITIS C ANTIBODY: HCV Ab: <0.1 s/co ratio (ref 0.0–0.9)

## 2015-04-30 LAB — CYCLIC CITRUL PEPTIDE ANTIBODY, IGG/IGA: CCP Antibodies IgG/IgA: 4 units (ref 0–19)

## 2015-04-30 SURGERY — LOOP RECORDER INSERTION
Anesthesia: LOCAL

## 2015-04-30 MED ORDER — TRAMADOL HCL 50 MG PO TABS
50.0000 mg | ORAL_TABLET | Freq: Four times a day (QID) | ORAL | Status: DC
  Administered 2015-04-30 – 2015-05-01 (×5): 50 mg via ORAL
  Filled 2015-04-30 (×5): qty 1

## 2015-04-30 MED ORDER — LIDOCAINE-EPINEPHRINE 1 %-1:100000 IJ SOLN
INTRAMUSCULAR | Status: DC | PRN
Start: 2015-04-30 — End: 2015-04-30
  Administered 2015-04-30: 11 mL

## 2015-04-30 MED ORDER — LIDOCAINE-EPINEPHRINE 1 %-1:100000 IJ SOLN
INTRAMUSCULAR | Status: AC
  Filled 2015-04-30: qty 1

## 2015-04-30 SURGICAL SUPPLY — 2 items
LOOP REVEAL LINQSYS (Prosthesis & Implant Heart) ×2 IMPLANT
PACK LOOP INSERTION (CUSTOM PROCEDURE TRAY) ×2 IMPLANT

## 2015-04-30 NOTE — PMR Pre-admission (Signed)
PMR Admission Coordinator Pre-Admission Assessment  Patient: Gary Barker is an 50 y.o., male MRN: [id number] DOB: 08/01/1965 Height: _0  (175.3 cm) Weight: 61 kg (134 lb 7.7 oz)              Insurance Information No insurance - self pay  Medicaid Application Date:        Case Manager:   Disability Application Date:        Case Worker:    Emergency Facilities manager Information    Name Relation Home Work Mobile   Delia Spouse   [phone]   Simone Curia Daughter   [phone]     Current Medical History  Patient Admitting Diagnosis: L MCA non-hemorrhagic infarct     History of Present Illness: A 50 y.o. male with history of tobacco use, recent gout flare affecting bilateral thumbs and heavy alcohol use who was admitted on 04/26/15 am with right sided weakness and confusion. He was last seen normal the night before. CTA head/neck limited but excluded large vessel occlusion. MRI/MRA brain done revealing extensive areas of non-hemorrhagic infarct throughout much of L-MCA territory sparing basal ganglia, focal high grade stenosis or filling defect in L-MCA distal M1 segment and approximately 50% mid basilar stenosis. 2D echo with EF 55-60% with no wall abnormality and grade 1 diastolic dysfunction. TEE with EF 55-60% and no wall abnormality. No thrombus or PFO seen and loop recorder placed by Dr. Caryl Comes on [facility] evaluation without signs of aspiration and patient placed on dysphagia 3, thin liquids. Cognitive evaluation shows mixed transcortical aphasia impacting expressive and receptive language with 25% accuracy for basic Y/N questions and ability to follow commands. He continues to have significant cognitive deficits impacting balance, gait and ability to carry out ADL tasks. CIR recommend for follow up therapy.     Total: 5=NIH  Past Medical History  Past Medical History  Diagnosis Date  . Gout attack 04/2015  . Hyperlipidemia     Family History  family  history includes Diabetes in his brother; Fibromyalgia in his mother; Hepatitis C in his mother; Seizures in his mother.  Prior Rehab/Hospitalizations: No previous rehab.  Has been healthy up until this admission for CVA.  Has the patient had major surgery during 100 days prior to admission? No  Current Medications   Current facility-administered medications:  .  0.9 %  sodium chloride infusion, , Intravenous, Continuous, Olam Idler, MD, Last Rate: 50 mL/hr at 04/30/15 2332 .  acetaminophen (TYLENOL) tablet 650 mg, 650 mg, Oral, Q6H PRN, Carlyle Dolly, MD, 650 mg at 04/30/15 1113 .  aspirin tablet 325 mg, 325 mg, Oral, Daily, Dickie La, MD, 325 mg at 05/01/15 0926 .  atorvastatin (LIPITOR) tablet 40 mg, 40 mg, Oral, q1800, Dickie La, MD, 40 mg at 04/30/15 1743 .  enoxaparin (LOVENOX) injection 40 mg, 40 mg, Subcutaneous, Q24H, Dickie La, MD, 40 mg at 04/30/15 2331 .  ipratropium-albuterol (DUONEB) 0.5-2.5 (3) MG/3ML nebulizer solution 3 mL, 3 mL, Inhalation, Q6H PRN, Dickie La, MD .  LORazepam (ATIVAN) injection 2-3 mg, 2-3 mg, Intravenous, Q1H PRN, Dickie La, MD .  multivitamin (PROSIGHT) tablet 1 tablet, 1 tablet, Oral, Daily, Dickie La, MD, 1 tablet at 05/01/15 0604 .  nicotine (NICODERM CQ - dosed in mg/24 hours) patch 14 mg, 14 mg, Transdermal, Daily, Dickie La, MD, 14 mg at 05/01/15 0927 .  senna-docusate (Senokot-S) tablet 1 tablet, 1 tablet, Oral, QHS PRN, Dickie La, MD .  sodium chloride 0.9 % injection 3 mL, 3 mL, Intravenous, Q12H, Veatrice Bourbon, MD, 3 mL at 04/30/15 0903 .  [DISCONTINUED] thiamine (B-1) injection 100 mg, 100 mg, Intravenous, Daily **OR** thiamine (VITAMIN B-1) tablet 100 mg, 100 mg, Oral, Daily, Dickie La, MD, 100 mg at 05/01/15 0926 .  traMADol (ULTRAM) tablet 50 mg, 50 mg, Oral, 4 times per day, Lupita Dawn, MD, 50 mg at 05/01/15 0603  Patients Current Diet: Diet regular Room service appropriate?: Yes; Fluid consistency::  Thin  Precautions / Restrictions Precautions Precautions: Fall Restrictions Weight Bearing Restrictions: No   Has the patient had 2 or more falls or a fall with injury in the past year?No  Prior Activity Level Community (5-7x/wk): Went out daily.  worked FT days in the [employer].  Was driving.  Home Assistive Devices / Equipment Home Assistive Devices/Equipment: Eyeglasses Home Equipment: None  Prior Device Use: Indicate devices/aids used by the patient prior to current illness, exacerbation or injury? None.  Prior Functional Level Prior Function Level of Independence: Independent Comments: Pt works daytime at [employer].  Had recently been off due to gout flareup. Family worried about money.  Sister in from [place] and she is asking about getting financial help for this family.  Asked CM for SW consult.   Self Care: Did the patient need help bathing, dressing, using the toilet or eating?  Independent  Indoor Mobility: Did the patient need assistance with walking from room to room (with or without device)? Independent  Stairs: Did the patient need assistance with internal or external stairs (with or without device)? Independent  Functional Cognition: Did the patient need help planning regular tasks such as shopping or remembering to take medications? Independent  Current Functional Level Cognition  Arousal/Alertness: Awake/alert Overall Cognitive Status: Impaired/Different from baseline Difficult to assess due to:  (significant expressive and receptive aphasia) Orientation Level: Oriented to person, Oriented to situation, Oriented to place Following Commands: Follows multi-step commands inconsistently General Comments: pt provided very quick fire commands with decr speed in response. tp with an echolia communication while performing balance assessment. pt repeating "turn Left turn Left" but without any attempts to walk L and no longer the focus of our session Attention:  Selective Selective Attention: Impaired Selective Attention Impairment: Verbal basic, Functional basic Safety/Judgment: Impaired    Extremity Assessment (includes Sensation/Coordination)  Upper Extremity Assessment: Generalized weakness  Lower Extremity Assessment: Generalized weakness RLE Deficits / Details: 3/5 grossly RLE Coordination: decreased fine motor, decreased gross motor    ADLs  Overall ADL's : Needs assistance/impaired Lower Body Dressing: Moderate assistance Lower Body Dressing Details (indicate cue type and reason): pt abel to doff shoes crossing bil LE but required family (A) to don / lace / tie Toilet Transfer: Min guard Functional mobility during ADLs: Minimal assistance General ADL Comments: pt with balance assessment below. pt with incr fall risk with narrowed base of support, task that require visual scanning with ambulation, cogntiive challenges with ambulation required and tantum standing. pt with lob with vision occlusion. So patient is fall risk going from dark room to a room full of light at night, fall risk for stepping into a tub or on a curb, fall risk with narrowed spaces between furniture in living room or in front of a toilet,  fall risk  if speed changes like urgency to void occurs.     Mobility  Overal bed mobility: Needs Assistance Bed Mobility: Supine to Sit Supine to sit: Supervision General bed mobility comments:  at [facility] on arrival with family    Transfers  Overall transfer level: Needs assistance Equipment used: None Transfers: Sit to/from Stand Sit to Stand: Min guard General transfer comment: needs incr time to static stand . pt reaching out with bil UE for balance    Ambulation / Gait / Stairs / Wheelchair Mobility  Ambulation/Gait Ambulation/Gait assistance: Min assist, Mod assist Ambulation Distance (Feet): 400 Feet Assistive device: None Gait Pattern/deviations: Step-through pattern, Decreased step length - right, Decreased  dorsiflexion - right, Decreased stance time - right, Decreased weight shift to right, Ataxic, Staggering left, Staggering right General Gait Details: Pt able to ambulate but needing min assist to mod assist at times as he has right knee instability and decr safety as he is unaware of deficits decreasing his safety with mobility tasks.  Pt at high risk of falls due to weakness right hemibody and cognitive issues affecting safety. Pt unable to express his thoughts due to aphasia as well. Pt unable to name his mother, brother or sister.  Frustrated at times due to this. Noted right LE fatigue as pt continued gait. Gait velocity interpretation: <1.8 ft/sec, indicative of risk for recurrent falls    Posture / Balance Balance Overall balance assessment: Needs assistance Sitting-balance support: No upper extremity supported, Feet supported Sitting balance-Leahy Scale: Fair Standing balance support: No upper extremity supported, During functional activity Standing balance-Leahy Scale: Poor Standing balance comment: Still needing steadying for static support.  High level balance activites: Direction changes, Turns, Sudden stops High Level Balance Comments: Needed mod assist for stability with gait when challenged. Standardized Balance Assessment Standardized Balance Assessment : Berg Balance Test, Dynamic Gait Index Berg Balance Test Sit to Stand: Able to stand  independently using hands Standing Unsupported: Able to stand 2 minutes with supervision Stand to Sit: Controls descent by using hands Transfers: Able to transfer safely, definite need of hands Standing Unsupported with Eyes Closed: Able to stand 10 seconds with supervision Standing Ubsupported with Feet Together: Able to place feet together independently but unable to hold for 30 seconds Turn 360 Degrees: Able to turn 360 degrees safely one side only in 4 seconds or less Standing Unsupported, Alternately Place Feet on Step/Stool: Able to stand  independently and complete 8 steps >20 seconds Standing Unsupported, One Foot in Front: Able to take small step independently and hold 30 seconds Standing on One Leg: Able to lift leg independently and hold equal to or more than 3 seconds Dynamic Gait Index Level Surface: Mild Impairment Change in Gait Speed: Moderate Impairment Gait with Horizontal Head Turns: Moderate Impairment Gait with Vertical Head Turns: Moderate Impairment Gait and Pivot Turn: Moderate Impairment Step Over Obstacle: Moderate Impairment Step Around Obstacles: Moderate Impairment Steps: Mild Impairment Total Score: 10    Special needs/care consideration BiPAP/CPAP No CPM No Continuous Drip IV 0.9% NS 50 ml/hr Dialysis No        Life Vest No Oxygen NO Special Bed No Trach Size No Wound Vac (area) No      Skin No                               Bowel mgmt: Last BM 04/27/15 Bladder mgmt: Voiding WDL Diabetic mgmt: Hgb A1C is elevated to 6.5, no history of diabetes    Previous Home Environment Living Arrangements: Spouse/significant other, Children  Lives With: Family Available Help at Discharge: Family, Available PRN/intermittently Type of Home: House Home Layout: Two  level, Bed/bath upstairs Alternate Level Stairs-Rails: Right Alternate Level Stairs-Number of Steps: 11 Home Access: Stairs to enter Entrance Stairs-Rails: None Entrance Stairs-Number of Steps: 4 Bathroom Shower/Tub: Optometrist: Yes Home Care Services: No  Discharge Living Setting Plans for Discharge Living Setting: Patient's home, House, Lives with (comment) (Lives with wife, 48 and 66 yo children.) Type of Home at Discharge: House Discharge Home Layout: Two level, 1/2 bath on main level, Bed/bath upstairs Alternate Level Stairs-Number of Steps: 11 steps to upstairs with bedroom/bathroom Discharge Home Access: Stairs to enter Entrance Stairs-Rails: None Entrance Stairs-Number of  Steps: 2 step entry with no rails Does the patient have any problems obtaining your medications?: No  Social/Family/Support Systems Patient Roles: Spouse, Parent, Other (Comment) (Has a wife, 4 and 55 yo children, mom, siblings.) Contact Information: Chancy Hurter - wife Anticipated Caregiver: wife and family Anticipated Caregiver's Contact Information: Elon Jester - wife [phone] Ability/Limitations of Caregiver: Wife works a temporary FT position nights 11:30 pm to 8 am.  She and patient worked opposite shifts Caregiver Availability: Other (Comment) (Family working on how to have someone stay with patient 24/7) Discharge Plan Discussed with Primary Caregiver: Yes Is Caregiver In Agreement with Plan?: Yes Does Caregiver/Family have Issues with Lodging/Transportation while Pt is in Rehab?: No (However family from [place] and [place].)  Goals/Additional Needs Patient/Family Goal for Rehab: PT/OT mod I and supervision goals, ST supervision and min assist goals Expected length of stay: 19-22 days (Note:  I anticipate a shorter LOS since patient is progressing well.  May only take 7 days inpatient rehab stay) Cultural Considerations: Speaks [place] and [place].  Wife speaks Spanish.  Family from [place].  Raised catholic but currently attending [employer]. Dietary Needs: Regular diet, thin liquids Equipment Needs: TBD Special Service Needs: Family concerned about finances and are asking for any help for family. Pt/Family Agrees to Admission and willing to participate: Yes (Met with sister, brother, daughter and wife.) Program Orientation Provided & Reviewed with Pt/Caregiver Including Roles  & Responsibilities: Yes  Decrease burden of Care through IP rehab admission: N/A  Possible need for SNF placement upon discharge: Not planned, family plan to stay with patient as needed after discharge.  Patient Condition: This patient's medical and functional status has changed since  the consult dated: 04/28/15 in which the Rehabilitation Physician determined and documented that the patient's condition is appropriate for intensive rehabilitative care in an inpatient rehabilitation facility. See "History of Present Illness" (above) for medical update. Functional changes are: Currently requiring min/mod assist to ambulate 400 feet with no device. Patient's medical and functional status update has been discussed with the Rehabilitation physician and patient remains appropriate for inpatient rehabilitation. Will admit to inpatient rehab today.  Preadmission Screen Completed By:  Retta Diones, 05/01/2015 10:34 AM ______________________________________________________________________   Discussed status with Dr. Letta Pate on 05/01/15 at 1034 and received telephone approval for admission today.  Admission Coordinator:  Retta Diones, time1034/Date01/19/17

## 2015-04-30 NOTE — Progress Notes (Signed)
Patient complaining of headache he has had since admission that is unrelieved by tylenol. MD paged.

## 2015-04-30 NOTE — Discharge Summary (Signed)
Family Medicine Teaching [facility] Discharge Summary  Patient name: Gary Barker Medical record number: [id number] Date of birth: 06/29/1965 Age: 50 y.o. Gender: male Date of Admission: 04/26/2015  Date of Discharge: 04/29/2014 Admitting Physician: Nestor Ramp, MD  Primary Care Provider: No primary care provider on file. Consultants: neurology, cardiology  Indication for Hospitalization: stroke  Discharge Diagnoses/Problem List:  Stroke, Alcohol use disorder & Tobacco use disorder  Disposition: CIR  Discharge Condition: improved  Discharge Exam:  Filed Vitals:   04/30/15 0800 04/30/15 1208 04/30/15 2156 05/01/15 0554  BP: 159/94 164/89 165/99 163/97  Pulse: 74 78 86 94  Temp:  99.1 F (37.3 C) 97.8 F (36.6 C) 97.8 F (36.6 C)  TempSrc:  Oral Oral Oral  Resp: Height:      Weight:      SpO2: 99% 100% 99% 100%   Physical Exam: Gen: lying in bed, tearful but no acute distress. Oropharynx: clear, moist  CV: regular rate and rythm. S1 & S2 audible, no murmurs. Resp: normal work of breathing, clear to auscultation bilaterally. GI: bowel sounds normal, no tenderness to palpation Skin: no lesions, normal bilateral hands negative for rash or bony abnormalities MSK: normal bulk, symmetric Neuro: exam is limited due to patient's difficulty to follow commands.  Mental status - Awake. Oriented only to self. Speech with intermittent expressive and receptive aphasia but improved.  Cranial Nerves - Pupils were equal and reactive; EOM normal, no nystagmus; no ptosis, face symmetric on smile, not able to access tongue protrusion, sternocleidomastoid and trapezius are with normal strength.  Tone - Normal.  Strength - normal in all muscle group  Reflexes - patellar and ankle reflex 2+ bilaterally Coordination : not able to assess  Gait: no pronator drift  Brief Hospital Course:  Gary Barker is a 50 y.o. male with history of alcohol and tobacco use who presents  with right sided weakness and receptive and expressive aphasia secondary to stroke  Aphasia and right sided weakness secondary to CVA in LMCA territory: improved. Patient with receptive and expressive aphasia consistent with his CT finding. He was outside tpa window on arrival.CT head on 01/14 with acute infarction involving the left insular ribbon and external capsule, and potential thrombus within the left middle cerebral artery. CTA head and neck on 01/14 suspicious for multifocal areas of nonhemorrhagic left MCA territory infarction. MRI brain on 01/14 with extensive areas of acute nonhemorrhagic infarction throughout much of the left MCA territory. Repeat CT head on 01/15 wihtout acute intracranial hemorrhage but large left MCA territory ischemic infarct. No sign of infection. UDS negative. EtOH level normal. EKG read as atrial flutter but NSR on further evaluation. Unclear source of stroke but concern for multiple emboli breaking from a larger clot source given distribution of infarcts. TEE on 1/17 without thrombosis or PFO. Loop recorder was inserted by cardiology on 1/18.  Patient was evaluated and transferred to CIR on 01/19.    Significant labs:  HIV, HCV, RPR, ANA, ACCP and uric acid are within normal limit  Issues for Follow Up:  1. Stroke: no known history of hypertension. Recommend good blood pressure control. Hypercoagulable labs. 2. Alcohol use disorder: recommend rehabilitation  3. Tobacco use disorder: recommend quitting smoking.  Significant Procedures: loop recorder insertion.  Significant Labs and Imaging:   Recent Labs Lab 04/26/15 1203 04/26/15 1216 04/27/15 0300 04/28/15 0220  WBC 7.5  --  7.8 8.0  HGB 16.4 17.3* 15.8 15.2  HCT 46.5 51.0  45.2 44.1  PLT 193  --  206 196    Recent Labs Lab 04/26/15 1203 04/26/15 1216 04/27/15 0300 04/28/15 0220  NA 140 138 138 138  K 4.9 5.4* 4.0 3.7  CL 102 101 106 107  CO2 26  --  22 24  GLUCOSE 98 91 96 123*  BUN CREATININE 0.75 0.70 0.68 0.72  CALCIUM 9.4  --  8.5* 8.8*  MG 2.6*  --   --   --   ALKPHOS 59  --   --   --   AST 27  --   --   --   ALT 25  --   --   --   ALBUMIN 3.9  --   --   --     Results/Tests Pending at Time of Discharge: antiphospholipid syndrome eval  Discharge Medications:    Medication List    ASK your doctor about these medications        albuterol-ipratropium 18-103 MCG/ACT inhaler  Commonly known as:  COMBIVENT  Inhale 1-2 puffs into the lungs every 6 (six) hours as needed for wheezing or shortness of breath.     predniSONE 10 MG tablet  Commonly known as:  DELTASONE  Take 10 mg by mouth See admin instructions.        Discharge Instructions: Please refer to Patient Instructions section of EMR for full details.  Patient was counseled important signs and symptoms that should prompt return to medical care, changes in medications, dietary instructions, activity restrictions, and follow up appointments.   Follow-Up Appointments:   Almon Hercules, MD 05/01/2015, 9:49 AM PGY-1, [facility]

## 2015-04-30 NOTE — Clinical Social Work Note (Signed)
Patient transferred to 5W02 from 2H19, CSW gave verbal handoff to unit CSW, this CSW to sign off.  Ervin Knack. Roxann Vierra, MSW, Theresia Majors [phone] 04/30/2015 12:11 PM

## 2015-04-30 NOTE — Evaluation (Signed)
Occupational Therapy Evaluation Patient Details Name: Gary Barker MRN: [id number] DOB: 08-20-1965 Today's Date: 04/30/2015    History of Present Illness Gary Barker is a 50 y.o. male who was last seen in his normal state around 5 or 6 PM last night. He was then seen to be abnormal around 9 PM. His wife works third shift, this morning when his wife saw him around 7 AM, he was asking inappropriate questions and dragging his right foot. She therefore brought him into the emergency room where CT head was performed which shows patchy left MCA territory infarct. Function has been waxing and waning, though pt passed RN stroke swallow SLP ordered to confirm tolerance. Pt is primary [place] speaking.     Clinical Impression   PT admitted with L mca. Pt currently with functional limitiations due to the deficits listed below (see OT problem list). PTA working full time and independent with all adls/ iadls. Pt will benefit from skilled OT to increase their independence and safety with adls and balance to allow discharge CIR. Pt with balance and cognitive deficits affecting all aspects of adls.     Follow Up Recommendations  CIR    Equipment Recommendations  Other (comment) (defer to CIR)    Recommendations for Other Services Rehab consult     Precautions / Restrictions Precautions Precautions: Fall Restrictions Weight Bearing Restrictions: No      Mobility Bed Mobility Overal bed mobility: Needs Assistance Bed Mobility: Supine to Sit     Supine to sit: Supervision     General bed mobility comments: at EOB on arrival with family  Transfers Overall transfer level: Needs assistance Equipment used: None Transfers: Sit to/from Stand Sit to Stand: Min guard         General transfer comment: needs incr time to static stand . pt reaching out with bil UE for balance    Balance Overall balance assessment: Needs assistance Sitting-balance support: No upper extremity supported;Feet  supported Sitting balance-Leahy Scale: Fair     Standing balance support: No upper extremity supported;During functional activity Standing balance-Leahy Scale: Poor Standing balance comment: Still needing steadying for static support.              High level balance activites: Direction changes;Turns;Sudden stops High Level Balance Comments: Needed mod assist for stability with gait when challenged. Standardized Balance Assessment Standardized Balance Assessment : Berg Balance Test;Dynamic Gait Index Berg Balance Test Sit to Stand: Able to stand  independently using hands Standing Unsupported: Able to stand 2 minutes with supervision Stand to Sit: Controls descent by using hands Transfers: Able to transfer safely, definite need of hands Standing Unsupported with Eyes Closed: Able to stand 10 seconds with supervision Standing Ubsupported with Feet Together: Able to place feet together independently but unable to hold for 30 seconds Turn 360 Degrees: Able to turn 360 degrees safely one side only in 4 seconds or less Standing Unsupported, Alternately Place Feet on Step/Stool: Able to stand independently and complete 8 steps >20 seconds Standing Unsupported, One Foot in Front: Able to take small step independently and hold 30 seconds Standing on One Leg: Able to lift leg independently and hold equal to or more than 3 seconds Dynamic Gait Index Level Surface: Mild Impairment Change in Gait Speed: Moderate Impairment Gait with Horizontal Head Turns: Moderate Impairment Gait with Vertical Head Turns: Moderate Impairment Gait and Pivot Turn: Moderate Impairment Step Over Obstacle: Moderate Impairment Step Around Obstacles: Moderate Impairment Steps: Mild Impairment Total Score: 10  ADL Overall ADL's : Needs assistance/impaired                     Lower Body Dressing: Moderate assistance Lower Body Dressing Details (indicate cue type and reason): pt abel to doff shoes  crossing bil LE but required family (A) to don / lace / tie Toilet Transfer: Min guard           Functional mobility during ADLs: Minimal assistance General ADL Comments: pt with balance assessment below. pt with incr fall risk with narrowed base of support, task that require visual scanning with ambulation, cogntiive challenges with ambulation required and tantum standing. pt with lob with vision occlusion. So patient is fall risk going from dark room to a room full of light at night, fall risk for stepping into a tub or on a curb, fall risk with narrowed spaces between furniture in living room or in front of a toilet,  fall risk  if speed changes like urgency to void occurs.      Vision     Perception     Praxis      Pertinent Vitals/Pain Pain Assessment: Faces Faces Pain Scale: Hurts little more Pain Location: HA Pain Descriptors / Indicators: Aching Pain Intervention(s): Repositioned;Premedicated before session;Monitored during session     Hand Dominance Right   Extremity/Trunk Assessment Upper Extremity Assessment Upper Extremity Assessment: Generalized weakness   Lower Extremity Assessment Lower Extremity Assessment: Generalized weakness   Cervical / Trunk Assessment Cervical / Trunk Assessment: Normal   Communication Communication Communication: Receptive difficulties;Expressive difficulties   Cognition Arousal/Alertness: Awake/alert Behavior During Therapy: Flat affect Overall Cognitive Status: Impaired/Different from baseline Area of Impairment: Following commands;Awareness     Memory: Decreased short-term memory Following Commands: Follows multi-step commands inconsistently   Awareness: Emergent   General Comments: pt provided very quick fire commands with decr speed in response. tp with an echolia communication while performing balance assessment. pt repeating "turn Left turn Left" but without any attempts to walk L and no longer the focus of our  session   General Comments       Exercises Exercises: General Lower Extremity     Shoulder Instructions      Home Living Family/patient expects to be discharged to:: Private residence Living Arrangements: Spouse/significant other;Children Available Help at Discharge: Family;Available PRN/intermittently Type of Home: House Home Access: Stairs to enter [employer] of Steps: 4 Entrance Stairs-Rails: None Home Layout: Two level;Bed/bath upstairs Alternate Level Stairs-Number of Steps: 11 Alternate Level Stairs-Rails: Right Bathroom Shower/Tub: Chief Strategy Officer: Standard Bathroom Accessibility: Yes   Home Equipment: None          Prior Functioning/Environment Level of Independence: Independent        Comments: Pt works daytime at [employer]  Had recently been off due to gout flareup. Family worried about money.  Sister in from [place] and she is asking about getting financial help for this family.  Asked CM for SW consult.     OT Diagnosis: Generalized weakness;Cognitive deficits   OT Problem List: Decreased strength;Decreased activity tolerance;Impaired balance (sitting and/or standing);Decreased cognition;Decreased safety awareness;Decreased knowledge of use of DME or AE;Decreased knowledge of precautions;Cardiopulmonary status limiting activity;Decreased coordination   OT Treatment/Interventions: Self-care/ADL training;Therapeutic exercise;Neuromuscular education;DME and/or AE instruction;Therapeutic activities;Cognitive remediation/compensation;Patient/family education;Balance training    OT Goals(Current goals can be found in the care plan section) Acute Rehab OT Goals Patient Stated Goal: to get better OT Goal Formulation: With patient Time For Goal Achievement: 05/14/15  Potential to Achieve Goals: Good  OT Frequency: Min 2X/week   Barriers to D/C:            Co-evaluation              End of Session Equipment Utilized During  Treatment: Gait belt Nurse Communication: Mobility status;Precautions  Activity Tolerance: Patient tolerated treatment well Patient left: in bed;with call bell/phone within reach;with family/visitor present;with nursing/sitter in room   Time: 1401-1421 OT Time Calculation (min): 20 min Charges:  OT General Charges $OT Visit: 1 Procedure OT Evaluation $OT Eval Moderate Complexity: 1 Procedure G-Codes:    Boone Master B May 07, 2015, 2:56 PM  Mateo Flow   OTR/L Pager: [phone] Office: [phone] .

## 2015-04-30 NOTE — Progress Notes (Signed)
STROKE TEAM PROGRESS NOTE   HISTORY DOCUMENTED AT THE TIME OF INITIAL ASSESSMENT Gary Barker is a 50 y.o. male who was last seen in his normal state around 5 or 6 PM last night. He was then seen to be abnormal around 9 PM. His wife works third shift, this morning when his wife saw him around 7 AM, he was asking inappropriate questions and dragging his right foot. She therefore brought him into the emergency room where CT head was performed which shows patchy left MCA territory infarct.  Of note, he states that he gets inflamed joints from time to time, and recently had inflamed bilateral thumb joints and is on prednisone for what I presume is gout.  LKW: 9 PM tpa given?: no, out of window   SUBJECTIVE (INTERVAL HISTORY) He had TEE asked today which was normal. His aphasia persists but is improving. Hypercoagulable panel labs and vasculitic labs are normal so far    OBJECTIVE Temp:  [97.5 F (36.4 C)-99.1 F (37.3 C)] 99.1 F (37.3 C) (01/18 1208) Pulse Rate:  [71-100] 78 (01/18 1208) Cardiac Rhythm:  [-] Normal sinus rhythm (01/18 1408) Resp:  [11-21] 20 (01/18 1208) BP: (126-166)/(71-100) 164/89 mmHg (01/18 1208) SpO2:  [96 %-100 %] 100 % (01/18 1208)  CBC:  Recent Labs Lab 04/26/15 1203  04/27/15 0300 04/28/15 0220  WBC 7.5  --  7.8 8.0  NEUTROABS 4.7  --   --   --   HGB 16.4  < > 15.8 15.2  HCT 46.5  < > 45.2 44.1  MCV 93.6  --  94.2 93.0  PLT 193  --  206 196  < > = values in this interval not displayed.  Basic Metabolic Panel:  Recent Labs Lab 04/26/15 1203  04/27/15 0300 04/28/15 0220  NA 140  < > 138 138  K 4.9  < > 4.0 3.7  CL 102  < > 106 107  CO2 26  --  22 24  GLUCOSE 98  < > 96 123*  BUN 9  < > 8 6  CREATININE 0.75  < > 0.68 0.72  CALCIUM 9.4  --  8.5* 8.8*  MG 2.6*  --   --   --   < > = values in this interval not displayed.  Lipid Panel:     Component Value Date/Time   CHOL 206* 04/27/2015 0300   TRIG 105 04/27/2015 0300   HDL 83 04/27/2015  0300   CHOLHDL 2.5 04/27/2015 0300   VLDL 21 04/27/2015 0300   LDLCALC 102* 04/27/2015 0300   HgbA1c:  Lab Results  Component Value Date   HGBA1C 6.5* 04/27/2015   Urine Drug Screen:     Component Value Date/Time   LABOPIA NONE DETECTED 04/26/2015 1211   COCAINSCRNUR NONE DETECTED 04/26/2015 1211   LABBENZ NONE DETECTED 04/26/2015 1211   AMPHETMU NONE DETECTED 04/26/2015 1211   THCU NONE DETECTED 04/26/2015 1211   LABBARB NONE DETECTED 04/26/2015 1211      IMAGING  Ct Angio Head W/cm &/or Wo Cm 04/26/2015   Technically poor quality CTA head neck. The study is sufficiently diagnostic to exclude a flow reducing extracranial stenosis or intracranial large vessel occlusion. Multifocal areas of nonhemorrhagic LEFT MCA territory infarction are suspected. MRI brain and intracranial MRA is pending.   Ct Head Wo Contrast 04/27/2015   No acute intracranial hemorrhage. Large left MCA territory ischemic infarct.   Ct Head Wo Contrast 04/26/2015   1. Acute infarction involving the LEFT  insular ribbon and external capsule.  2. Potential thrombus within the LEFT middle cerebral artery.    Ct Angio Neck W/cm &/or Wo/cm 04/26/2015   Technically poor quality CTA head neck. The study is sufficiently diagnostic to exclude a flow reducing extracranial stenosis or intracranial large vessel occlusion. Multifocal areas of nonhemorrhagic LEFT MCA territory infarction are suspected. MRI brain and intracranial MRA is pending.   Mr Shirlee Latch Wo Contrast 04/26/2015   Extensive areas of acute nonhemorrhagic infarction throughout much of the LEFT MCA territory, with sparing of the basal ganglia. No proximal large vessel occlusion, but there is either a focal high-grade stenosis or intraluminal filling defect in the LEFT MCA, distal M1 segment. This is not well seen on CTA. Mid basilar stenosis estimated 50%, along with basilar hypoplasia due to fetal PCA segments. Dominant LEFT vertebral.     PHYSICAL  EXAM Constitutional: Appears well-developed and well-nourished.  Psych: Affect appropriate to situation Eyes: No scleral injection HENT: No OP obstrucion Head: Normocephalic.  Cardiovascular: Normal rate and regular rhythm.  Respiratory: Effort normal and breath sounds normal to anterior ascultation GI: Soft. No distension. There is no tenderness.  Skin: WDI  Neuro: Mental Status: Patient is awake.  Patient is no longer able to give a clear and coherent history. He has global aphasia. He is able to spontaneously speak in sentences e.g. "I am fine." However, he does not follow commands Has some echolalia  Cranial Nerves: II: PERRL; VF decreased to threat from right.    III,IV, VI: EOMI without ptosis or diploplia.  V: Facial sensation difficult to assess VII: Facial movement is symmetric.  VIII: hearing is intact to voice XII: tongue is midline without atrophy or fasciculations.   Motor: Tone is normal. Bulk is normal. Mild right grip weakness. Diminished fine finger movements on the right. Orbits left over right upper extremity..  No significant lower extremity weakness Sensory:  Sensation is symmetric to pinprick  Cerebellar: Deferred  ASSESSMENT/PLAN [name] is a 50 y.o. male with suspected history of  Gout, asthma, tobacco use, and alcohol abuse presenting with altered mental status and right lower extremity weakness. He did not receive IV t-PA due to late presentation.   Stroke:  Dominant infarct possibly thromboembolic. Source yet undetermined  Resultant  Global aphasia and minimal right hemiparesis  MRI - Extensive areas of acute nonhemorrhagic infarction throughout much of the LEFT MCA territory,  MRA -  focal high-grade stenosis or intraluminal filling defect in the LEFT MCA, distal M1 segment.  Carotid Doppler - refer to CTA of the neck.  2D Echo - Left ventricle: The cavity size was normal. Wall thickness was normal. Systolic function was  normal. The estimated ejection fraction was in the range of 55% to 60%. Wall motion was normal; there were no regional wall motion abnormalities  LDL - 102  HgbA1c 6.5  VTE prophylaxis - Lovenox Diet regular Room service appropriate?: Yes; Fluid consistency:: Thin  No antithrombotic prior to admission, now on aspirin 300 mg suppository daily  Patient counseled to be compliant with his antithrombotic medications  Ongoing aggressive stroke risk factor management  Therapy recommendations: Pending  Disposition: Pending  Hypertension  Blood pressure is elevated  Permissive hypertension (OK if < 220/120) but gradually normalize in 5-7 days  Hyperlipidemia  Home meds: No lipid lowering medications prior to admission  LDL 102, goal < 70  Continue Lipitor 40 mg daily  Continue statin at discharge  Other Stroke Risk Factors  Cigarette smoker,  advised to stop smoking  Heavy ETOH use per family  Gout  Other Active Problems  ETOH history - PRN Ativan.  On CIWA protocol  Asthma Hx. - Has PRN MDI.  Hospital day # 4         ATTENDING NOTE: Patient was seen and examined by me personally. Documentation reflects findings. The laboratory and radiographic studies reviewed by me. ROS pertinent positives could not be fully documented due to aphasia     Assessment and plan completed by me personally and fully documented above. Plans/Recommendations include:     Monitor closely for neurological worsening  Continue stroke work-up  Thiamine, folate, CIWA protocol   Therapy recommendations  need inpatient rehab.    follow hypercoagulable and vasculitic labs  D/w   primary team. Stroke team will sign off. Follow-up as an outpatient in stroke clinic in 1 month to do TCD bubble study  SIGNED BY: Dr. Delia Heady, MD   To contact Stroke Continuity provider, please refer to [email or web address]. After hours, contact General Neurology

## 2015-04-30 NOTE — Consult Note (Signed)
ELECTROPHYSIOLOGY CONSULT NOTE  Patient ID: Gary Barker MRN: [id number], DOB/AGE: 1965-11-02   Admit date: 04/26/2015 Date of Consult: 04/30/2015  Primary Physician: No primary care provider on file. Primary Cardiologist: None Reason for Consultation: Cryptogenic stroke - 04/25/14; recommendations regarding Implantable Loop Recorder  History of Present Illness Gary Barker was admitted on 04/26/2015 with acute CVA.  They first developed symptoms while at home, noted by his wife R foot weakness/dragging and speech difficulty.  Imaging demonstrated L MCA  infarct.  he has undergone workup for stroke including echocardiogram and carotid angio.  The patient has been monitored on telemetry which has demonstrated sinus rhythm with no arrhythmias.  Inpatient stroke work-up is to be completed with a TEE.   04/27/15 Echocardiogram this admission demonstrated  Study Conclusions - Left ventricle: The cavity size was normal. Wall thickness was normal. Systolic function was normal. The estimated ejection fraction was in the range of 55% to 60%. Wall motion was normal; there were no regional wall motion abnormalities. Doppler parameters are consistent with abnormal left ventricular relaxation (grade 1 diastolic dysfunction). The E/e&' ratio is between 8-15, suggesting indeterminate LV filling pressure. - Left atrium: The atrium was normal in size. - Inferior vena cava: The vessel was normal in size. The respirophasic diameter changes were in the normal range (>= 50%), consistent with normal central venous pressure. Impressions: - LVEF 55-60%, normal wall thickness, normal wall motion, diastolic dysfunction, indeterminate LV filling pressure, normal LA size  04/29/15 TEE Study Conclusions - Left ventricle: Systolic function was normal. The estimated ejection fraction was in the range of 55% to 60%. Wall motion was normal; there were no regional wall motion abnormalities. -  Aortic valve: There was trivial regurgitation. - Left atrium: No evidence of thrombus in the atrial cavity or appendage. - Right atrium: No evidence of thrombus in the atrial cavity or appendage. - Atrial septum: No defect or patent foramen ovale was identified. Negative bubble study.  Lab work is reviewed.  Prior to admission, the patient denies chest pain, shortness of breath, dizziness, palpitations, or syncope.  They are recovering from their stroke with plans to inpatient rehab at discharge.  EP has been asked to evaluate for placement of an implantable loop recorder to monitor for atrial fibrillation.     Past Medical History  Diagnosis Date  . Gout attack 04/2015  . Hyperlipidemia      Surgical History:  Past Surgical History  Procedure Laterality Date  . Tee without cardioversion N/A 04/29/2015    Procedure: TRANSESOPHAGEAL ECHOCARDIOGRAM (TEE);  Surgeon: Jake Bathe, MD;  Location[facility] ENDOSCOPY;  Service: Cardiovascular;  Laterality: N/A;     Prescriptions prior to admission  Medication Sig Dispense Refill Last Dose  . albuterol-ipratropium (COMBIVENT) 18-103 MCG/ACT inhaler Inhale 1-2 puffs into the lungs every 6 (six) hours as needed for wheezing or shortness of breath.   04/25/2015 at Unknown time  . predniSONE (DELTASONE) 10 MG tablet Take 10 mg by mouth See admin instructions.    04/25/2015 at Unknown time    Inpatient Medications:  . aspirin  325 mg Oral Daily  . atorvastatin  40 mg Oral q1800  . enoxaparin (LOVENOX) injection  40 mg Subcutaneous Q24H  . multivitamin  1 tablet Oral Daily  . nicotine  14 mg Transdermal Daily  . sodium chloride  3 mL Intravenous Q12H  . thiamine  100 mg Oral Daily  . traMADol  50 mg Oral 4 times per day    Allergies:  Allergies  Allergen Reactions  . Fish Allergy     SWELLING     Social History   Social History  . Marital Status: Married    Spouse Name: N/A  . Number of Children: N/A  . Years of Education:  N/A   Occupational History  . Not on file.   Social History Main Topics  . Smoking status: Current Every Day Smoker -- 0.50 packs/day for 30 years    Types: Cigarettes  . Smokeless tobacco: Never Used  . Alcohol Use: Yes     Comment: drinks a 6 pack of beer a day   . Drug Use: No  . Sexual Activity: Not on file   Other Topics Concern  . Not on file   Social History Narrative     Family History  Problem Relation Age of Onset  . Seizures Mother   . Diabetes Brother   . Hepatitis C Mother   . Fibromyalgia Mother       Review of Systems: All other systems reviewed and are otherwise negative except as noted above.  Physical Exam: Filed Vitals:   04/30/15 0700 04/30/15 0741 04/30/15 0800 04/30/15 1208  BP:  142/97 159/94 164/89  Pulse: 72 74 74 78  Temp:  98.6 F (37 C)  99.1 F (37.3 C)  TempSrc:  Oral  Oral  Resp: 14 11 17 20   Height:      Weight:      SpO2: 99% 100% 99% 100%    GEN- The patient is well appearing, alert and oriented x 3 today, degree of aphasia noted  Head- normocephalic, atraumatic Eyes-  Sclera clear, conjunctiva pink Ears- hearing intact Oropharynx- clear Neck- supple Lungs- Clear to ausculation bilaterally, normal work of breathing Heart- Regular rate and rhythm, no murmurs, rubs or gallops  GI- soft, NT, ND Extremities- no clubbing, cyanosis, or edema MS- no significant deformity or atrophy Skin- no rash or lesion Psych- euthymic mood, full affect   Labs:   Lab Results  Component Value Date   WBC 8.0 04/28/2015   HGB 15.2 04/28/2015   HCT 44.1 04/28/2015   MCV 93.0 04/28/2015   PLT 196 04/28/2015    Recent Labs Lab 04/26/15 1203  04/28/15 0220  NA 140  < > 138  K 4.9  < > 3.7  CL 102  < > 107  CO2 26  < > 24  BUN 9  < > 6  CREATININE 0.75  < > 0.72  CALCIUM 9.4  < > 8.8*  PROT 6.7  --   --   BILITOT 1.2  --   --   ALKPHOS 59  --   --   ALT 25  --   --   AST 27  --   --   GLUCOSE 98  < > 123*  < > = values in  this interval not displayed. No results found for: CKTOTAL, CKMB, CKMBINDEX, TROPONINI Lab Results  Component Value Date   CHOL 206* 04/27/2015   Lab Results  Component Value Date   HDL 83 04/27/2015   Lab Results  Component Value Date   LDLCALC 102* 04/27/2015   Lab Results  Component Value Date   TRIG 105 04/27/2015   Lab Results  Component Value Date   CHOLHDL 2.5 04/27/2015     Radiology/Studies:  Ct Angio Head W/cm &/or Wo Cm 04/26/2015  CLINICAL DATA:  RIGHT-sided weakness.  Unknown last seen normal. EXAM: CT ANGIOGRAPHY HEAD AND NECK TECHNIQUE: Multidetector  CT imaging of the head and neck was performed using the standard protocol during bolus administration of intravenous contrast. Multiplanar CT image reconstructions and MIPs were obtained to evaluate the vascular anatomy. Carotid stenosis measurements (when applicable) are obtained utilizing NASCET criteria, using the distal internal carotid diameter as the denominator. CONTRAST:  80mL OMNIPAQUE IOHEXOL 350 MG/ML SOLN COMPARISON:  Noncontrast CT head 04/26/2015. FINDINGS: The study was marred by technical difficulties. For the first injection, the wrong vessel was chosen for triggering (pulmonary artery instead of descending thoracic aorta). For the second run, the tubing became disconnected and an unknown amount of contrast spilled on the floor. Instead of performing a third injection, MRA intracranial will be performed to correlate with the findings described below. CT HEAD Calvarium and skull base: No fracture or destructive lesion. Mastoids and middle ears are grossly clear. Paranasal sinuses: Imaged portions are clear. Orbits: Negative. Brain: Patchy areas of hypoattenuation throughout the LEFT hemisphere, including the insula, frontal, temporal, and parietal lobes and adjacent subcortical white matter, nonhemorrhagic. No mass lesion or midline shift. No hydrocephalus or extra-axial fluid. CTA NECK Aortic arch: Standard  branching. Imaged portion shows no evidence of aneurysm or dissection. No significant stenosis of the major arch vessel origins. Moderate atheromatous calcification is non stenotic. Right carotid system: Calcific plaque without significant narrowing of the lumen. No ulceration. No evidence of dissection, stenosis (50% or greater) or occlusion. Left carotid system: Calcific plaque without significant narrowing of the lumen. No ulceration. No evidence of dissection, stenosis (50% or greater) or occlusion. Vertebral arteries: Codominant. No evidence of dissection, stenosis (50% or greater) or occlusion. Ostial plaque bilaterally. CTA HEAD Anterior circulation: Calcification in the carotid siphons, and supraclinoid segments, greater on the LEFT, not clearly flow reducing. No M1 stenosis on the RIGHT and LEFT. No significant stenosis, proximal occlusion, aneurysm, or vascular malformation. 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 No significant stenosis, proximal occlusion, aneurysm, or vascular malformation. Venous sinuses: As permitted by contrast timing, patent. Anatomic variants: BILATERAL fetal PCA. Delayed phase:   No abnormal intracranial enhancement. IMPRESSION: Technically poor quality CTA head neck. The study is sufficiently diagnostic to exclude a flow reducing extracranial stenosis or intracranial large vessel occlusion. Multifocal areas of nonhemorrhagic LEFT MCA territory infarction are suspected. MRI brain and intracranial MRA is pending. Electronically Signed   By: Elsie Stain M.D.   On: 04/26/2015 15:32   Ct Head Wo Contrast 04/27/2015  CLINICAL DATA:  50 year old male with altered mental status EXAM: CT HEAD WITHOUT CONTRAST TECHNIQUE: Contiguous axial images were obtained from the base of the skull through the vertex without intravenous contrast. COMPARISON:  Brain CT and MRI dated 04/26/2015 FINDINGS: There are areas of cortical and subcortical hypodensity  involving the left frontal, temporal, and parietal lobe in the left MCA territory distribution compatible with ischemic infarct. There has been interval increase and conspicuity of these hypodensities compared the prior CT compatible with edema. There is no acute intracranial hemorrhage. Ventricles and sulci are appropriate in size for patient's age. There is no significant mass effect. No midline shift. The visualized paranasal sinuses and mastoid air cells are well aerated. The calvarium is intact. IMPRESSION: No acute intracranial hemorrhage. Large left MCA territory ischemic infarct. Electronically Signed   By: Elgie Collard M.D.   On: 04/27/2015 01:07   Ct Head Wo Contrast 04/26/2015  CLINICAL DATA:  Weakness, facial droop and aphasia EXAM: CT HEAD WITHOUT CONTRAST TECHNIQUE: Contiguous axial images were obtained from the base  of the skull through the vertex without intravenous contrast. COMPARISON:  None. FINDINGS: There is low-attenuation within the LEFT insular ribbon and external capsule (image 14, series 2) LEFT middle cerebral artery is prominent with high-density (image 10 and 11 of series 2) which is less specific. There is no intracranial hemorrhage. No midline shift or mass effect. Paranasal sinuses and  mastoid air cells are clear. IMPRESSION: 1. Acute infarction involving the LEFT insular ribbon and external capsule. 2. Potential thrombus within the LEFT middle cerebral artery. Findings conveyed to: ZAMMIT, JOSEPHon 04/26/2015  at13:06. Electronically Signed   By: Genevive Bi M.D.   On: 04/26/2015 13:08    Mr Maxine Glenn Head Wo Contrast 04/26/2015  CLINICAL DATA:  RIGHT-sided weakness.  Unknown last seen normal. EXAM: MRI HEAD WITHOUT CONTRAST MRA HEAD WITHOUT CONTRAST TECHNIQUE: Multiplanar, multiecho pulse sequences of the brain and surrounding structures were obtained without intravenous contrast. Angiographic images of the head were obtained using MRA technique without contrast. COMPARISON:   CT head, CT angio head neck, earlier today. FINDINGS: MRI HEAD FINDINGS The patient was unable to remain motionless for the exam. Small or subtle lesions could be overlooked. Extensive gyriform restricted diffusion throughout much of the LEFT MCA territory, including frontal, temporal, insular, and parietal lobes, as well as adjacent subcortical white matter. No definite hemorrhagic transformation. Generalized atrophy premature for age. Minor white matter disease, nonspecific. No midline abnormalities. Flow voids are maintained. No extracranial soft tissue abnormalities of significance. MRA HEAD FINDINGS The internal carotid arteries are widely patent. The RIGHT anterior cerebral artery is severely diseased, and both anterior cerebral is are predominantly supplied from the LEFT. The RIGHT MCA trunk and branches are normal. On the LEFT, distal M1 segment, there appears to be a flow reducing lesion. Patient motion degrades the axial source images. It is not clear from interrogation of those axial source images, as well as correlation with CTA, if this represents a severe stenosis or intraluminal filling defect. See for instance image 14 series 501, as well as image 98, series 5. Decreased size and number of distal LEFT MCA branches; the distal M1 lesion is nonocclusive. The basilar artery is supplied predominantly by the LEFT vertebral. BILATERAL fetal PCA segments. Mid basilar stenosis approaches 50%. IMPRESSION: Extensive areas of acute nonhemorrhagic infarction throughout much of the LEFT MCA territory, with sparing of the basal ganglia. No proximal large vessel occlusion, but there is either a focal high-grade stenosis or intraluminal filling defect in the LEFT MCA, distal M1 segment. This is not well seen on CTA. Mid basilar stenosis estimated 50%, along with basilar hypoplasia due to fetal PCA segments. Dominant LEFT vertebral. Electronically Signed   By: Elsie Stain M.D.   On: 04/26/2015 17:00    12-lead  ECG SR All prior EKG's in EPIC reviewed with no documented atrial fibrillation  Telemetry SR NO afib  Assessment and Plan:  1. Cryptogenic stroke The patient presents with cryptogenic stroke.  The patient has a TEE planned for this AM.  I spoke at length with the patient about monitoring for afib with either a 30 day event monitor or an implantable loop recorder.  Risks, benefits, and alteratives to implantable loop recorder were discussed with the patient today.   At this time, the patient is very clear in their decision to proceed with implantable loop recorder.   Wound care was reviewed with the patient (keep incision clean and dry for 3 days).  Wound check scheduled for 05/09/15.  Please call with questions.  Renee Norberto Sorenson, PA-C 04/30/2015   Hx reviewed and pt examined  Reviewed with sister (RN) daugher and pt the issue of afib and Cryptogenic Stroke  Will proceed with ILR

## 2015-04-30 NOTE — Progress Notes (Addendum)
Physical Therapy Treatment Patient Details Name: Gary Barker MRN: [id number] DOB: [birth year] Today's Date: 04/30/2015    History of Present Illness Gary Barker is a 50 y.o. male who was last seen in his normal state around 5 or 6 PM last night. He was then seen to be abnormal around 9 PM. His wife works third shift, this morning when his wife saw him around 7 AM, he was asking inappropriate questions and dragging his right foot. She therefore brought him into the emergency room where CT head was performed which shows patchy left MCA territory infarct. Function has been waxing and waning, though pt passed RN stroke swallow SLP ordered to confirm tolerance. Pt is primary [place] speaking.      PT Comments    Pt admitted with above diagnosis. Pt currently with functional limitations due to balance and endurance deficits as well as cognitive issues.  Pt continues to need mod assist with challenges to gait.  Pt with right UE poor coordination with family reporting that pt drops silverware when eating but it is improving.  Very slow processing as well with pt with delayed response to commands as well.  Pt is very impaired compared to what he was able to do PTA in taking care of entire family.   Agree with Rehab.   Pt will benefit from skilled PT to increase their independence and safety with mobility to allow discharge to the venue listed below.    Follow Up Recommendations  CIR;Supervision/Assistance - 24 hour     Equipment Recommendations  Other (comment) (TBA)    Recommendations for Other Services       Precautions / Restrictions Precautions Precautions: Fall Restrictions Weight Bearing Restrictions: No    Mobility  Bed Mobility Overal bed mobility: Needs Assistance Bed Mobility: Supine to Sit     Supine to sit: Supervision     General bed mobility comments: Able to get to EOB without assist.   Transfers Overall transfer level: Needs assistance Equipment used: None Transfers:  Sit to/from Stand Sit to Stand: Min guard         General transfer comment: Still needs steadying assist at times due to continued right LE poor coordination and weakness.    Ambulation/Gait Ambulation/Gait assistance: Min assist;Mod assist Ambulation Distance (Feet): 400 Feet Assistive device: None Gait Pattern/deviations: Step-through pattern;Decreased step length - right;Decreased dorsiflexion - right;Decreased stance time - right;Decreased weight shift to right;Ataxic;Staggering left;Staggering right   Gait velocity interpretation: <1.8 ft/sec, indicative of risk for recurrent falls General Gait Details: Pt able to ambulate but needing min assist to mod assist at times as he has right knee instability and decr safety as he is unaware of deficits decreasing his safety with mobility tasks.  Pt at high risk of falls due to weakness right hemibody and cognitive issues affecting safety. Pt unable to express his thoughts due to aphasia as well. Pt unable to name his mother, brother or sister.  Frustrated at times due to this. Noted right LE fatigue as pt continued gait.   Stairs            Wheelchair Mobility    Modified Rankin (Stroke Patients Only) Modified Rankin (Stroke Patients Only) Pre-Morbid Rankin Score: No significant disability Modified Rankin: Moderately severe disability     Balance Overall balance assessment: Needs assistance;History of Falls Sitting-balance support: No upper extremity supported;Feet supported Sitting balance-Leahy Scale: Fair     Standing balance support: No upper extremity supported;During functional activity Standing balance-Leahy Scale: Poor Standing  balance comment: Still needing steadying for static support.              High level balance activites: Direction changes;Turns;Sudden stops High Level Balance Comments: Needed mod assist for stability with gait when challenged.    Cognition Arousal/Alertness: Awake/alert Behavior  During Therapy: Flat affect Overall Cognitive Status: Difficult to assess (aphasia)                      Exercises General Exercises - Lower Extremity Ankle Circles/Pumps: AROM;Both;10 reps;Seated Long Arc Quad: AROM;Both;10 reps;Seated Hip Flexion/Marching: AROM;Both;5 reps;Seated    General Comments        Pertinent Vitals/Pain Pain Assessment: Faces Faces Pain Scale: Hurts little more Pain Location: headache Pain Descriptors / Indicators: Aching Pain Intervention(s): Limited activity within patient's tolerance;Monitored during session;Patient requesting pain meds-RN notified;Repositioned  VSS    Home Living                      Prior Function            PT Goals (current goals can now be found in the care plan section) Progress towards PT goals: Progressing toward goals    Frequency  Min 4X/week    PT Plan Current plan remains appropriate    Co-evaluation             End of Session Equipment Utilized During Treatment: Gait belt Activity Tolerance: Patient limited by fatigue Patient left: in chair;with call bell/phone within reach;with chair alarm set;with family/visitor present     Time: 1022-1050 PT Time Calculation (min) (ACUTE ONLY): 28 min  Charges:  $Gait Training: 8-22 mins $Therapeutic Exercise: 8-22 mins                    G CodesTawni Millers Barker 16-May-2015, 1:55 PM [employer] [facility] [phone] (pager)

## 2015-04-30 NOTE — Progress Notes (Signed)
Family Medicine Teaching Service Daily Progress Note Intern Pager: [phone]  Patient name: Gary Barker Medical record number: [id number] Date of birth: Jun 12, 1965 Age: 50 y.o. Gender: male  Primary Care Provider: No primary care provider on file. Consultants: Neuro Code Status: full  Pt Overview and Major Events to Date:  1/14: L MCA CVA  Assessment and Plan: Gary Barker is a 50 y.o. male presenting with ischemic stroke. PMH is significant for heavy drinking, heavy smoking and questionable hypertension.  CVA in LMCA territory: improving based on neuro exam today. Patient with receptive and expressive aphasia consistent with his CT finding. He was outside tpa window on arrival.CT head with acute infarction involving the left insular ribbon and external capsule, and potential thrombus within the left middle cerebral artery. CTA suspicious for multifocal areas of nonhemorrhagic left MCA territory infarction. No sign of infection. UDS negative. EtOH level normal. EKG read as atrial flutter but NSR on further evaluation. Unclear source of stroke at this time. Some concern for multiple emboli breaking from a larger clot source given distribution of infarcts. MRI brain with extensive areas of acute nonhemorrhagic infarction throughout much of the LEFT MCA territory. TEE without thrombosis or abnormality. Lipid panel 102 and HDL 83. TSH within normal. A1c 6.5 - Appreciate neuro recs. Will follow further recs  -monitor closely for cerebral edema  -will talk to neuro if he needs Holter - Hypercoagulability labs as outpatient as patient is on Lovenox. - Will start normalizing BP - SLP/PT/OT eval-rec CIR - Full dose ASA and Atorvastatin 40 mg   Questionable H/o Hypertension: not clear on this. Seen by Dr. Jenne Campus at [facility] for left arm pain on 04/18/2015. Did RA factor which was negative. Started on prednisone. BP at that time 140/80. No other PMH or labs. -start normalizing his BP. BP  159/99 this AM  Concern for acute polyarticular inflammatory condition: presented to UC on 01/06 with left arm pain. Was given prednisone. No imaging. RA negative. ESR slightly elevated. ANA normal -f/u ACCP & antiphospholipid  Screening of infectious disease: HIV, HCV and RPR nonreactive  H/o asthma?: he is on combivent. Wife says he hasn't used the medication in awhile.  -Albuterol as needed.  Alcohol use disorder: history of heavy drinking per family members. B12 and Folate within normal limit -CIWA. Scores = 0>0>0. No Ativan given -Recommend EtOH rehab on discharge  Tobacco use disorder: unclear how much he smokes but per family he ' is always outside smoking' -Nicotine patch 14 mg qD  FEN/GI: Dys 3 Diet with thin liquids. NS @ 50 cc/hr. Protonix  Prophylaxis: lovenox   Disposition: CIR  pending availability of bed  Subjective:  Patient intermittently able to express himself. But there is improvement per daughter at bedside. Able to follow commands intermittently during exam. Oriented only to self  Objective: Temp:  [97.5 F (36.4 C)-98.3 F (36.8 C)] 98 F (36.7 C) (01/18 0339) Pulse Rate:  [71-100] 72 (01/18 0700) Resp:  [12-21] 14 (01/18 0700) BP: (126-230)/(71-127) 158/99 mmHg (01/18 0600) SpO2:  [96 %-100 %] 99 % (01/18 0700) Physical Exam: Gen: lying in bed, NAD. Eyes: pupils equal, round and reactive to light Oropharynx: clear, moist  CV: regular rate and rythm. S1 & S2 audible, no murmurs. Resp: normal work of breathing, clear to auscultation bilaterally. GI: bowel sounds normal, no tenderness to palpation Skin: no lesions, normal bilateral hands negative for rash or bony abnormalities MSK: normal bulk, symmetric Neuro: MS - Awake. Oriented only to  self. Speech with intermittent expressive and receptive aphasia but improved.  Cranial Nerves - Pupils were equal and reactive (5 to 40m); EOM normal, no nystagmus; no ptosis, face symmetric on smile, tongue  protrusion symmetric, sternocleidomastoid and trapezius are with normal strength.  Tone - Normal.  Strength - normal in all muscle group  Reflexes - patellar and ankle reflex 2+ bilaterally Coordination : not able to assess Gait: no pronator drift   Laboratory:  Recent Labs Lab 04/26/15 1203 04/26/15 1216 04/27/15 0300 04/28/15 0220  WBC 7.5  --  7.8 8.0  HGB 16.4 17.3* 15.8 15.2  HCT 46.5 51.0 45.2 44.1  PLT 193  --  206 196    Recent Labs Lab 04/26/15 1203 04/26/15 1216 04/27/15 0300 04/28/15 0220  NA 140 138 138 138  K 4.9 5.4* 4.0 3.7  CL 102 101 106 107  CO2 26  --  22 24  BUN '9 12 8 6  ' CREATININE 0.75 0.70 0.68 0.72  CALCIUM 9.4  --  8.5* 8.8*  PROT 6.7  --   --   --   BILITOT 1.2  --   --   --   ALKPHOS 59  --   --   --   ALT 25  --   --   --   AST 27  --   --   --   GLUCOSE 98 91 96 123*   Lipid Panel     Component Value Date/Time   CHOL 206* 04/27/2015 0300   TRIG 105 04/27/2015 0300   HDL 83 04/27/2015 0300   CHOLHDL 2.5 04/27/2015 0300   VLDL 21 04/27/2015 0300   LDLCALC 102* 04/27/2015 0300   Imaging/Diagnostic Tests: Ct Angio Head W/cm &/or Wo Cm  04/26/2015  CLINICAL DATA:  RIGHT-sided weakness.  Unknown last seen normal. EXAM: CT ANGIOGRAPHY HEAD AND NECK TECHNIQUE: Multidetector CT imaging of the head and neck was performed using the standard protocol during bolus administration of intravenous contrast. Multiplanar CT image reconstructions and MIPs were obtained to evaluate the vascular anatomy. Carotid stenosis measurements (when applicable) are obtained utilizing NASCET criteria, using the distal internal carotid diameter as the denominator. CONTRAST:  827mOMNIPAQUE IOHEXOL 350 MG/ML SOLN COMPARISON:  Noncontrast CT head 04/26/2015. FINDINGS: The study was marred by technical difficulties. For the first injection, the wrong vessel was chosen for triggering (pulmonary artery instead of descending thoracic aorta). For the second run, the  tubing became disconnected and an unknown amount of contrast spilled on the floor. Instead of performing a third injection, MRA intracranial will be performed to correlate with the findings described below. CT HEAD Calvarium and skull base: No fracture or destructive lesion. Mastoids and middle ears are grossly clear. Paranasal sinuses: Imaged portions are clear. Orbits: Negative. Brain: Patchy areas of hypoattenuation throughout the LEFT hemisphere, including the insula, frontal, temporal, and parietal lobes and adjacent subcortical white matter, nonhemorrhagic. No mass lesion or midline shift. No hydrocephalus or extra-axial fluid. CTA NECK Aortic arch: Standard branching. Imaged portion shows no evidence of aneurysm or dissection. No significant stenosis of the major arch vessel origins. Moderate atheromatous calcification is non stenotic. Right carotid system: Calcific plaque without significant narrowing of the lumen. No ulceration. No evidence of dissection, stenosis (50% or greater) or occlusion. Left carotid system: Calcific plaque without significant narrowing of the lumen. No ulceration. No evidence of dissection, stenosis (50% or greater) or occlusion. Vertebral arteries: Codominant. No evidence of dissection, stenosis (50% or greater) or occlusion. Ostial  plaque bilaterally. CTA HEAD Anterior circulation: Calcification in the carotid siphons, and supraclinoid segments, greater on the LEFT, not clearly flow reducing. No M1 stenosis on the RIGHT and LEFT. No significant stenosis, proximal occlusion, aneurysm, or vascular malformation. 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 No significant stenosis, proximal occlusion, aneurysm, or vascular malformation. Venous sinuses: As permitted by contrast timing, patent. Anatomic variants: BILATERAL fetal PCA. Delayed phase:   No abnormal intracranial enhancement. IMPRESSION: Technically poor quality CTA head neck. The study is  sufficiently diagnostic to exclude a flow reducing extracranial stenosis or intracranial large vessel occlusion. Multifocal areas of nonhemorrhagic LEFT MCA territory infarction are suspected. MRI brain and intracranial MRA is pending. Electronically Signed   By: Staci Righter M.D.   On: 04/26/2015 15:32   Ct Head Wo Contrast  04/27/2015  CLINICAL DATA:  50 year old male with altered mental status EXAM: CT HEAD WITHOUT CONTRAST TECHNIQUE: Contiguous axial images were obtained from the base of the skull through the vertex without intravenous contrast. COMPARISON:  Brain CT and MRI dated 04/26/2015 FINDINGS: There are areas of cortical and subcortical hypodensity involving the left frontal, temporal, and parietal lobe in the left MCA territory distribution compatible with ischemic infarct. There has been interval increase and conspicuity of these hypodensities compared the prior CT compatible with edema. There is no acute intracranial hemorrhage. Ventricles and sulci are appropriate in size for patient's age. There is no significant mass effect. No midline shift. The visualized paranasal sinuses and mastoid air cells are well aerated. The calvarium is intact. IMPRESSION: No acute intracranial hemorrhage. Large left MCA territory ischemic infarct. Electronically Signed   By: Anner Crete M.D.   On: 04/27/2015 01:07   Ct Head Wo Contrast  04/26/2015  CLINICAL DATA:  Weakness, facial droop and aphasia EXAM: CT HEAD WITHOUT CONTRAST TECHNIQUE: Contiguous axial images were obtained from the base of the skull through the vertex without intravenous contrast. COMPARISON:  None. FINDINGS: There is low-attenuation within the LEFT insular ribbon and external capsule (image 14, series 2) LEFT middle cerebral artery is prominent with high-density (image 10 and 11 of series 2) which is less specific. There is no intracranial hemorrhage. No midline shift or mass effect. Paranasal sinuses and  mastoid air cells are clear.  IMPRESSION: 1. Acute infarction involving the LEFT insular ribbon and external capsule. 2. Potential thrombus within the LEFT middle cerebral artery. Findings conveyed to: ZAMMIT, JOSEPHon 04/26/2015  at13:06. Electronically Signed   By: Suzy Bouchard M.D.   On: 04/26/2015 13:08   Ct Angio Neck W/cm &/or Wo/cm  04/26/2015  CLINICAL DATA:  RIGHT-sided weakness.  Unknown last seen normal. EXAM: CT ANGIOGRAPHY HEAD AND NECK TECHNIQUE: Multidetector CT imaging of the head and neck was performed using the standard protocol during bolus administration of intravenous contrast. Multiplanar CT image reconstructions and MIPs were obtained to evaluate the vascular anatomy. Carotid stenosis measurements (when applicable) are obtained utilizing NASCET criteria, using the distal internal carotid diameter as the denominator. CONTRAST:  32m OMNIPAQUE IOHEXOL 350 MG/ML SOLN COMPARISON:  Noncontrast CT head 04/26/2015. FINDINGS: The study was marred by technical difficulties. For the first injection, the wrong vessel was chosen for triggering (pulmonary artery instead of descending thoracic aorta). For the second run, the tubing became disconnected and an unknown amount of contrast spilled on the floor. Instead of performing a third injection, MRA intracranial will be performed to correlate with the findings described below. CT HEAD Calvarium and skull base: No fracture or  destructive lesion. Mastoids and middle ears are grossly clear. Paranasal sinuses: Imaged portions are clear. Orbits: Negative. Brain: Patchy areas of hypoattenuation throughout the LEFT hemisphere, including the insula, frontal, temporal, and parietal lobes and adjacent subcortical white matter, nonhemorrhagic. No mass lesion or midline shift. No hydrocephalus or extra-axial fluid. CTA NECK Aortic arch: Standard branching. Imaged portion shows no evidence of aneurysm or dissection. No significant stenosis of the major arch vessel origins. Moderate  atheromatous calcification is non stenotic. Right carotid system: Calcific plaque without significant narrowing of the lumen. No ulceration. No evidence of dissection, stenosis (50% or greater) or occlusion. Left carotid system: Calcific plaque without significant narrowing of the lumen. No ulceration. No evidence of dissection, stenosis (50% or greater) or occlusion. Vertebral arteries: Codominant. No evidence of dissection, stenosis (50% or greater) or occlusion. Ostial plaque bilaterally. CTA HEAD Anterior circulation: Calcification in the carotid siphons, and supraclinoid segments, greater on the LEFT, not clearly flow reducing. No M1 stenosis on the RIGHT and LEFT. No significant stenosis, proximal occlusion, aneurysm, or vascular malformation. 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 No significant stenosis, proximal occlusion, aneurysm, or vascular malformation. Venous sinuses: As permitted by contrast timing, patent. Anatomic variants: BILATERAL fetal PCA. Delayed phase:   No abnormal intracranial enhancement. IMPRESSION: Technically poor quality CTA head neck. The study is sufficiently diagnostic to exclude a flow reducing extracranial stenosis or intracranial large vessel occlusion. Multifocal areas of nonhemorrhagic LEFT MCA territory infarction are suspected. MRI brain and intracranial MRA is pending. Electronically Signed   By: Staci Righter M.D.   On: 04/26/2015 15:32   Mr Jodene Nam Head Wo Contrast  04/26/2015  CLINICAL DATA:  RIGHT-sided weakness.  Unknown last seen normal. EXAM: MRI HEAD WITHOUT CONTRAST MRA HEAD WITHOUT CONTRAST TECHNIQUE: Multiplanar, multiecho pulse sequences of the brain and surrounding structures were obtained without intravenous contrast. Angiographic images of the head were obtained using MRA technique without contrast. COMPARISON:  CT head, CT angio head neck, earlier today. FINDINGS: MRI HEAD FINDINGS The patient was unable to remain  motionless for the exam. Small or subtle lesions could be overlooked. Extensive gyriform restricted diffusion throughout much of the LEFT MCA territory, including frontal, temporal, insular, and parietal lobes, as well as adjacent subcortical white matter. No definite hemorrhagic transformation. Generalized atrophy premature for age. Minor white matter disease, nonspecific. No midline abnormalities. Flow voids are maintained. No extracranial soft tissue abnormalities of significance. MRA HEAD FINDINGS The internal carotid arteries are widely patent. The RIGHT anterior cerebral artery is severely diseased, and both anterior cerebral is are predominantly supplied from the LEFT. The RIGHT MCA trunk and branches are normal. On the LEFT, distal M1 segment, there appears to be a flow reducing lesion. Patient motion degrades the axial source images. It is not clear from interrogation of those axial source images, as well as correlation with CTA, if this represents a severe stenosis or intraluminal filling defect. See for instance image 14 series 501, as well as image 98, series 5. Decreased size and number of distal LEFT MCA branches; the distal M1 lesion is nonocclusive. The basilar artery is supplied predominantly by the LEFT vertebral. BILATERAL fetal PCA segments. Mid basilar stenosis approaches 50%. IMPRESSION: Extensive areas of acute nonhemorrhagic infarction throughout much of the LEFT MCA territory, with sparing of the basal ganglia. No proximal large vessel occlusion, but there is either a focal high-grade stenosis or intraluminal filling defect in the LEFT MCA, distal M1 segment. This is not well seen  on CTA. Mid basilar stenosis estimated 50%, along with basilar hypoplasia due to fetal PCA segments. Dominant LEFT vertebral. Electronically Signed   By: Staci Righter M.D.   On: 04/26/2015 17:00   Mr Brain Wo Contrast  04/26/2015  CLINICAL DATA:  RIGHT-sided weakness.  Unknown last seen normal. EXAM: MRI HEAD  WITHOUT CONTRAST MRA HEAD WITHOUT CONTRAST TECHNIQUE: Multiplanar, multiecho pulse sequences of the brain and surrounding structures were obtained without intravenous contrast. Angiographic images of the head were obtained using MRA technique without contrast. COMPARISON:  CT head, CT angio head neck, earlier today. FINDINGS: MRI HEAD FINDINGS The patient was unable to remain motionless for the exam. Small or subtle lesions could be overlooked. Extensive gyriform restricted diffusion throughout much of the LEFT MCA territory, including frontal, temporal, insular, and parietal lobes, as well as adjacent subcortical white matter. No definite hemorrhagic transformation. Generalized atrophy premature for age. Minor white matter disease, nonspecific. No midline abnormalities. Flow voids are maintained. No extracranial soft tissue abnormalities of significance. MRA HEAD FINDINGS The internal carotid arteries are widely patent. The RIGHT anterior cerebral artery is severely diseased, and both anterior cerebral is are predominantly supplied from the LEFT. The RIGHT MCA trunk and branches are normal. On the LEFT, distal M1 segment, there appears to be a flow reducing lesion. Patient motion degrades the axial source images. It is not clear from interrogation of those axial source images, as well as correlation with CTA, if this represents a severe stenosis or intraluminal filling defect. See for instance image 14 series 501, as well as image 98, series 5. Decreased size and number of distal LEFT MCA branches; the distal M1 lesion is nonocclusive. The basilar artery is supplied predominantly by the LEFT vertebral. BILATERAL fetal PCA segments. Mid basilar stenosis approaches 50%. IMPRESSION: Extensive areas of acute nonhemorrhagic infarction throughout much of the LEFT MCA territory, with sparing of the basal ganglia. No proximal large vessel occlusion, but there is either a focal high-grade stenosis or intraluminal filling  defect in the LEFT MCA, distal M1 segment. This is not well seen on CTA. Mid basilar stenosis estimated 50%, along with basilar hypoplasia due to fetal PCA segments. Dominant LEFT vertebral. Electronically Signed   By: Staci Righter M.D.   On: 04/26/2015 17:00   Mercy Riding, MD 04/30/2015, 7:04 AM PGY-1, [place] Intern pager: [phone], text pages welcome

## 2015-04-30 NOTE — Progress Notes (Signed)
Rehab admissions - Noted patient transferred to 5W.  I spoke with resident.  I will plan to admit to acute inpatient rehab tomorrow if he remains stable over night.  Call me for questions.  #[phone]

## 2015-04-30 NOTE — Progress Notes (Signed)
NURSING PROGRESS NOTE  Gary Barker [id number] Transfer Data: 04/30/2015 12:13 PM Attending Provider: Nestor Ramp, MD PCP:No primary care provider on file. Code Status: FULL  Gary Barker is a 50 y.o. male patient transferred from 2H -No acute distress noted.  -No complaints of shortness of breath.  -No complaints of chest pain.   Cardiac Monitoring: Box #  11 in place.   Blood pressure 164/89, pulse 78, temperature 99.1 F (37.3 C), temperature source Oral, resp. rate 20, height  (1.753 m), weight 61 kg (134 lb 7.7 oz), SpO2 100 %.   Allergies:  Fish allergy  Past Medical History:   has a past medical history of Gout attack (04/2015) and Hyperlipidemia.  Past Surgical History:   has past surgical history that includes TEE without cardioversion (N/A, 04/29/2015).  Social History:   reports that he has been smoking Cigarettes.  He has a 15 pack-year smoking history. He has never used smokeless tobacco. He reports that he drinks alcohol. He reports that he does not use illicit drugs.  Skin: intact  Patient/Family orientated to room. Information packet given to patient/family. Admission inpatient armband information verified with patient/family to include name and date of birth and placed on patient arm. Side rails up x 2, fall assessment and education completed with patient/family. Patient/family able to verbalize understanding of risk associated with falls and verbalized understanding to call for assistance before getting out of bed. Call light within reach. Patient/family able to voice and demonstrate understanding of unit orientation instructions.    Will continue to evaluate and treat per MD orders.

## 2015-04-30 NOTE — Progress Notes (Signed)
Report received from [place], [place] for transfer to [phone]

## 2015-04-30 NOTE — Progress Notes (Signed)
Patient gave verbal consent for implantable loop recorder placement and unable to sign related to difficulties from stroke. Sister signed for patient at bedside

## 2015-05-01 ENCOUNTER — Inpatient Hospital Stay (HOSPITAL_COMMUNITY)
Admission: AD | Admit: 2015-05-01 | Discharge: 2015-05-08 | DRG: 066 | Disposition: A | Discharge status: Home Health | Payer: Medicaid Other | Source: Intra-hospital | Attending: Physical Medicine & Rehabilitation | Admitting: Physical Medicine & Rehabilitation

## 2015-05-01 ENCOUNTER — Encounter (HOSPITAL_COMMUNITY): Payer: Self-pay | Admitting: Internal Medicine

## 2015-05-01 DIAGNOSIS — R4701 Aphasia: Secondary | ICD-10-CM | POA: Diagnosis not present

## 2015-05-01 DIAGNOSIS — Z91013 Allergy to seafood: Secondary | ICD-10-CM

## 2015-05-01 DIAGNOSIS — I63519 Cerebral infarction due to unspecified occlusion or stenosis of unspecified middle cerebral artery: Secondary | ICD-10-CM | POA: Diagnosis present

## 2015-05-01 DIAGNOSIS — M109 Gout, unspecified: Secondary | ICD-10-CM | POA: Diagnosis present

## 2015-05-01 DIAGNOSIS — I63512 Cerebral infarction due to unspecified occlusion or stenosis of left middle cerebral artery: Principal | ICD-10-CM | POA: Diagnosis present

## 2015-05-01 DIAGNOSIS — I69391 Dysphagia following cerebral infarction: Secondary | ICD-10-CM

## 2015-05-01 DIAGNOSIS — I69991 Dysphagia following unspecified cerebrovascular disease: Secondary | ICD-10-CM | POA: Diagnosis not present

## 2015-05-01 DIAGNOSIS — R519 Headache, unspecified: Secondary | ICD-10-CM | POA: Diagnosis present

## 2015-05-01 DIAGNOSIS — I69320 Aphasia following cerebral infarction: Secondary | ICD-10-CM

## 2015-05-01 DIAGNOSIS — I69390 Apraxia following cerebral infarction: Secondary | ICD-10-CM | POA: Diagnosis not present

## 2015-05-01 DIAGNOSIS — I69398 Other sequelae of cerebral infarction: Secondary | ICD-10-CM | POA: Insufficient documentation

## 2015-05-01 DIAGNOSIS — R269 Unspecified abnormalities of gait and mobility: Secondary | ICD-10-CM

## 2015-05-01 DIAGNOSIS — I639 Cerebral infarction, unspecified: Secondary | ICD-10-CM

## 2015-05-01 DIAGNOSIS — K5900 Constipation, unspecified: Secondary | ICD-10-CM | POA: Diagnosis present

## 2015-05-01 DIAGNOSIS — R4189 Other symptoms and signs involving cognitive functions and awareness: Secondary | ICD-10-CM | POA: Diagnosis present

## 2015-05-01 DIAGNOSIS — I63312 Cerebral infarction due to thrombosis of left middle cerebral artery: Secondary | ICD-10-CM

## 2015-05-01 DIAGNOSIS — I69920 Aphasia following unspecified cerebrovascular disease: Secondary | ICD-10-CM | POA: Diagnosis not present

## 2015-05-01 DIAGNOSIS — E785 Hyperlipidemia, unspecified: Secondary | ICD-10-CM | POA: Diagnosis present

## 2015-05-01 DIAGNOSIS — I10 Essential (primary) hypertension: Secondary | ICD-10-CM | POA: Diagnosis present

## 2015-05-01 DIAGNOSIS — R51 Headache: Secondary | ICD-10-CM

## 2015-05-01 LAB — ANTIPHOSPHOLIPID SYNDROME EVAL, BLD
Anticardiolipin IgA: <9 APL U/mL (ref 0–11)
Anticardiolipin IgM: <9 MPL U/mL (ref 0–12)
DRVVT: 39.1 s (ref 0.0–44.0)
PHOSPHATYDALSERINE, IGG: 3 {GPS'U} (ref 0–11)
PHOSPHATYDALSERINE, IGM: 4 {MPS'U} (ref 0–25)
PTT Lupus Anticoagulant: 40 s (ref 0.0–40.6)

## 2015-05-01 MED ORDER — PROCHLORPERAZINE MALEATE 5 MG PO TABS: 5.0000 mg | ORAL_TABLET | Freq: Four times a day (QID) | ORAL | Status: DC | PRN

## 2015-05-01 MED ORDER — FLEET ENEMA 7-19 GM/118ML RE ENEM: 1.0000 | ENEMA | Freq: Once | RECTAL | Status: DC | PRN

## 2015-05-01 MED ORDER — ALUM & MAG HYDROXIDE-SIMETH 200-200-20 MG/5ML PO SUSP: 30.0000 mL | ORAL | Status: DC | PRN

## 2015-05-01 MED ORDER — PROCHLORPERAZINE EDISYLATE 5 MG/ML IJ SOLN: 5.0000 mg | Freq: Four times a day (QID) | INTRAMUSCULAR | Status: DC | PRN

## 2015-05-01 MED ORDER — TRAZODONE HCL 50 MG PO TABS: 25.0000 mg | ORAL_TABLET | Freq: Every evening | ORAL | Status: DC | PRN

## 2015-05-01 MED ORDER — ENOXAPARIN SODIUM 30 MG/0.3ML ~~LOC~~ SOLN
30.0000 mg | SUBCUTANEOUS | Status: DC
  Administered 2015-05-01 – 2015-05-02 (×2): 30 mg via SUBCUTANEOUS
  Filled 2015-05-01 (×2): qty 0.3

## 2015-05-01 MED ORDER — BISACODYL 10 MG RE SUPP
10.0000 mg | Freq: Every day | RECTAL | Status: DC | PRN
  Administered 2015-05-02 – 2015-05-07 (×2): 10 mg via RECTAL
  Filled 2015-05-01 (×2): qty 1

## 2015-05-01 MED ORDER — ATORVASTATIN CALCIUM 40 MG PO TABS
40.0000 mg | ORAL_TABLET | Freq: Every day | ORAL | Status: DC
  Administered 2015-05-02 – 2015-05-07 (×6): 40 mg via ORAL
  Filled 2015-05-01 (×7): qty 1

## 2015-05-01 MED ORDER — TRAMADOL HCL 50 MG PO TABS: 50.0000 mg | ORAL_TABLET | Freq: Four times a day (QID) | ORAL | Status: DC

## 2015-05-01 MED ORDER — METHOCARBAMOL 500 MG PO TABS
500.0000 mg | ORAL_TABLET | Freq: Four times a day (QID) | ORAL | Status: DC | PRN
  Administered 2015-05-01: 500 mg via ORAL
  Filled 2015-05-01: qty 1

## 2015-05-01 MED ORDER — ATORVASTATIN CALCIUM 40 MG PO TABS: 40.0000 mg | ORAL_TABLET | Freq: Every day | ORAL | Status: DC

## 2015-05-01 MED ORDER — ACETAMINOPHEN 325 MG PO TABS
650.0000 mg | ORAL_TABLET | Freq: Four times a day (QID) | ORAL | Status: DC | PRN
  Administered 2015-05-01 – 2015-05-05 (×7): 650 mg via ORAL
  Filled 2015-05-01 (×8): qty 2

## 2015-05-01 MED ORDER — POLYETHYLENE GLYCOL 3350 17 G PO PACK
17.0000 g | PACK | Freq: Two times a day (BID) | ORAL | Status: DC
  Administered 2015-05-01 – 2015-05-07 (×13): 17 g via ORAL
  Filled 2015-05-01 (×14): qty 1

## 2015-05-01 MED ORDER — DIPHENHYDRAMINE HCL 12.5 MG/5ML PO ELIX
12.5000 mg | ORAL_SOLUTION | Freq: Four times a day (QID) | ORAL | Status: DC | PRN
Start: 2015-05-01 — End: 2015-05-08

## 2015-05-01 MED ORDER — VITAMIN B-1 100 MG PO TABS
100.0000 mg | ORAL_TABLET | Freq: Every day | ORAL | Status: DC
  Administered 2015-05-02 – 2015-05-08 (×7): 100 mg via ORAL
  Filled 2015-05-01 (×7): qty 1

## 2015-05-01 MED ORDER — ASPIRIN 325 MG PO TABS: 325.0000 mg | ORAL_TABLET | Freq: Every day | ORAL | Status: DC

## 2015-05-01 MED ORDER — ASPIRIN EC 325 MG PO TBEC
325.0000 mg | DELAYED_RELEASE_TABLET | Freq: Every day | ORAL | Status: DC
Start: 2015-05-02 — End: 2015-05-08
  Administered 2015-05-02 – 2015-05-08 (×7): 325 mg via ORAL
  Filled 2015-05-01 (×7): qty 1

## 2015-05-01 MED ORDER — TRAMADOL HCL 50 MG PO TABS
50.0000 mg | ORAL_TABLET | Freq: Four times a day (QID) | ORAL | Status: DC
  Administered 2015-05-02 – 2015-05-08 (×26): 50 mg via ORAL
  Filled 2015-05-01 (×27): qty 1

## 2015-05-01 MED ORDER — NICOTINE 14 MG/24HR TD PT24
14.0000 mg | MEDICATED_PATCH | Freq: Every day | TRANSDERMAL | Status: DC
  Administered 2015-05-02 – 2015-05-08 (×7): 14 mg via TRANSDERMAL
  Filled 2015-05-01 (×7): qty 1

## 2015-05-01 MED ORDER — THIAMINE HCL 100 MG PO TABS: 100.0000 mg | ORAL_TABLET | Freq: Every day | ORAL | Status: DC

## 2015-05-01 MED ORDER — GUAIFENESIN-DM 100-10 MG/5ML PO SYRP: 5.0000 mL | ORAL_SOLUTION | Freq: Four times a day (QID) | ORAL | Status: DC | PRN

## 2015-05-01 MED ORDER — PROCHLORPERAZINE 25 MG RE SUPP: 12.5000 mg | Freq: Four times a day (QID) | RECTAL | Status: DC | PRN

## 2015-05-01 MED ORDER — IPRATROPIUM-ALBUTEROL 0.5-2.5 (3) MG/3ML IN SOLN: 3.0000 mL | Freq: Four times a day (QID) | RESPIRATORY_TRACT | Status: DC | PRN

## 2015-05-01 MED ORDER — LISINOPRIL 5 MG PO TABS
2.5000 mg | ORAL_TABLET | Freq: Every day | ORAL | Status: DC
  Administered 2015-05-01 – 2015-05-07 (×7): 2.5 mg via ORAL
  Filled 2015-05-01 (×7): qty 1

## 2015-05-01 MED ORDER — PROSIGHT PO TABS
1.0000 | ORAL_TABLET | Freq: Every day | ORAL | Status: DC
  Administered 2015-05-01 – 2015-05-07 (×7): 1 via ORAL
  Filled 2015-05-01 (×8): qty 1

## 2015-05-01 MED ORDER — SENNOSIDES-DOCUSATE SODIUM 8.6-50 MG PO TABS
1.0000 | ORAL_TABLET | Freq: Every evening | ORAL | Status: DC | PRN
  Administered 2015-05-02: 1 via ORAL
  Filled 2015-05-01: qty 1

## 2015-05-01 MED ORDER — STROKE: EARLY STAGES OF RECOVERY BOOK
Freq: Once | Status: AC
  Administered 2015-05-01: 15:00:00
  Filled 2015-05-01: qty 1

## 2015-05-01 NOTE — Care Management Note (Signed)
Case Management Note  Patient Details  Name: Ervan Heber MRN: [id number] Date of Birth: 1966-03-22  Subjective/Objective:                 Patient admitted form home for CVA. Transferred from stepdown. Anticipate DC to CIR today.   Action/Plan:  Will DC to CIR today.  Expected Discharge Date:                  Expected Discharge Plan:  IP Rehab Facility  In-House Referral:  Financial Counselor  Discharge planning Services  CM Consult  Post Acute Care Choice:    Choice offered to:     DME Arranged:    DME Agency:     HH Arranged:    HH Agency:     Status of Service:  Completed, signed off  Medicare Important Message Given:    Date Medicare IM Given:    Medicare IM give by:    Date Additional Medicare IM Given:    Additional Medicare Important Message give by:     If discussed at Long Length of Stay Meetings, dates discussed:    Additional Comments:  Lawerance Sabal, RN 05/01/2015, 11:52 AM

## 2015-05-01 NOTE — Progress Notes (Signed)
Rehab admissions - I am planning to admit to acute inpatient rehab today.  I met with patient and his sister at the bedside and they are in agreement.  Bed available and will admit today.  #[phone] 

## 2015-05-01 NOTE — Progress Notes (Signed)
CSW consulted regarding medicaid application. CSW left a message for financial counseling and explained to patient Medicaid application information.  CSW signing off.  Osborne Casco Sherrod Toothman LCSWA [phone]

## 2015-05-01 NOTE — Progress Notes (Signed)
Tele removed prior to d/c to IPR

## 2015-05-01 NOTE — H&P (Signed)
Physical Medicine and Rehabilitation Admission H&P   Chief Complaint  Patient presents with  . Right sided weakness, difficulty with speech, cognitive deficits     HPI: Gary Barker is a 50 y.o. male with history of tobacco use, recent gout flare affecting bilateral thumbs and heavy alcohol use who was admitted on 04/26/15 am with right sided weakness and confusion. He was last seen normal the night before. CTA head/neck limited but excluded large vessel occlusion. MRI/MRA brain done revealing extensive areas of non-hemorrhagic infarct throughout much of L-MCA territory sparing basal ganglia, focal high grade stenosis or filling defect in L-MCA distal M1 segment and approximately 50% mid basilar stenosis. 2D echo with EF 55-60% with no wall abnormality and grade 1 diastolic dysfunction. TEE with EF 55-60% and no wall abnormality. No thrombus or PFO seen and loop recorder placed by Dr. Graciela Husbands on [facility] evaluation without signs of aspiration and patient placed on dysphagia 3, thin liquids. Cognitive evaluation shows mixed transcortical aphasia impacting expressive and receptive language with 25% accuracy for basic Y/N questions and ability to follow commands. He continues to have significant cognitive deficits impacting balance, gait and ability to carry out ADL tasks. CIR recommend for follow up therapy.   Patient has difficulty answering questions. He has reduced ability to follow verbal commands  Review of Systems  Unable to perform ROS: language  Eyes: Negative for blurred vision.  Cardiovascular: Negative for chest pain and palpitations.  Gastrointestinal: Positive for constipation. Negative for heartburn and nausea.      Past Medical History  Diagnosis Date  . Gout attack 04/2015  . Hyperlipidemia     Past Surgical History  Procedure Laterality Date  . Tee without cardioversion N/A 04/29/2015    Procedure: TRANSESOPHAGEAL ECHOCARDIOGRAM (TEE);  Surgeon: Jake Bathe, MD; Location: [facility] ENDOSCOPY; Service: Cardiovascular; Laterality: N/A;  . Ep implantable device N/A 04/30/2015    Procedure: Loop Recorder Insertion; Surgeon: Duke Salvia, MD; Location: [facility] INVASIVE CV LAB; Service: Cardiovascular; Laterality: N/A;    Family History  Problem Relation Age of Onset  . Seizures Mother   . Diabetes Brother   . Hepatitis C Mother   . Fibromyalgia Mother     Social History: Lives with wife. 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 His family lives in [place]. Per reports that he has never smoked. He does not have any smokeless tobacco history on file. Per reports that he drinks alcohol--"a lot" per mother. Per reports that he does not use illicit drugs.    Allergies  Allergen Reactions  . Fish Allergy     SWELLING   . Shellfish Allergy Swelling   Medications Prior to Admission  Medication Sig Dispense Refill  . albuterol-ipratropium (COMBIVENT) 18-103 MCG/ACT inhaler Inhale 1-2 puffs into the lungs every 6 (six) hours as needed for wheezing or shortness of breath.    . predniSONE (DELTASONE) 10 MG tablet Take 10 mg by mouth See admin instructions.       Home: Home Living Family/patient expects to be discharged to:: Private residence Living Arrangements: Spouse/significant other, Children Available Help at Discharge: Family, Available PRN/intermittently Type of Home: House Home Access: Stairs to enter Secretary/administrator of Steps: 4 Entrance Stairs-Rails: None Home Layout: Two level, Bed/bath upstairs Alternate Level Stairs-Number of Steps: 11 Alternate Level Stairs-Rails: Right Bathroom Shower/Tub: Engineer, manufacturing systems: Standard Bathroom Accessibility: Yes Home Equipment: None Lives With: Family  Functional History: Prior Function Level of  Independence: Independent Comments: Pt works  daytime at [employer] Had recently been off due to gout flareup. Family worried about money. Sister in from [place] and she is asking about getting financial help for this family. Asked CM for SW consult.   Functional Status:  Mobility: Bed Mobility Overal bed mobility: Needs Assistance Bed Mobility: Supine to Sit Supine to sit: Supervision General bed mobility comments: at EOB on arrival with family Transfers Overall transfer level: Needs assistance Equipment used: None Transfers: Sit to/from Stand Sit to Stand: Min guard General transfer comment: needs incr time to static stand . pt reaching out with bil UE for balance Ambulation/Gait Ambulation/Gait assistance: Min assist, Mod assist Ambulation Distance (Feet): 400 Feet Assistive device: None Gait Pattern/deviations: Step-through pattern, Decreased step length - right, Decreased dorsiflexion - right, Decreased stance time - right, Decreased weight shift to right, Ataxic, Staggering left, Staggering right General Gait Details: Pt able to ambulate but needing min assist to mod assist at times as he has right knee instability and decr safety as he is unaware of deficits decreasing his safety with mobility tasks. Pt at high risk of falls due to weakness right hemibody and cognitive issues affecting safety. Pt unable to express his thoughts due to aphasia as well. Pt unable to name his mother, brother or sister. Frustrated at times due to this. Noted right LE fatigue as pt continued gait. Gait velocity interpretation: <1.8 ft/sec, indicative of risk for recurrent falls    ADL: ADL Overall ADL's : Needs assistance/impaired Lower Body Dressing: Moderate assistance Lower Body Dressing Details (indicate cue type and reason): pt abel to doff shoes crossing bil LE but required family (A) to don / lace / tie Toilet Transfer: Min guard Functional mobility during ADLs: Minimal assistance General ADL Comments: pt with balance assessment below. pt  with incr fall risk with narrowed base of support, task that require visual scanning with ambulation, cogntiive challenges with ambulation required and tantum standing. pt with lob with vision occlusion. So patient is fall risk going from dark room to a room full of light at night, fall risk for stepping into a tub or on a curb, fall risk with narrowed spaces between furniture in living room or in front of a toilet, fall risk if speed changes like urgency to void occurs.   Cognition: Cognition Overall Cognitive Status: Impaired/Different from baseline Arousal/Alertness: Awake/alert Orientation Level: Oriented to person, Oriented to situation, Oriented to place Attention: Selective Selective Attention: Impaired Selective Attention Impairment: Verbal basic, Functional basic Safety/Judgment: Impaired Cognition Arousal/Alertness: Awake/alert Behavior During Therapy: Flat affect Overall Cognitive Status: Impaired/Different from baseline Area of Impairment: Following commands, Awareness Memory: Decreased short-term memory Following Commands: Follows multi-step commands inconsistently Awareness: Emergent General Comments: pt provided very quick fire commands with decr speed in response. tp with an echolia communication while performing balance assessment. pt repeating "turn Left turn Left" but without any attempts to walk L and no longer the focus of our session Difficult to assess due to: (significant expressive and receptive aphasia)   Blood pressure 163/97, pulse 94, temperature 97.8 F (36.6 C), temperature source Oral, resp. rate 20, height 5\' 9"  (1.753 m), weight 61 kg (134 lb 7.7 oz), SpO2 100 %. Physical Exam  Nursing note and vitals reviewed. Constitutional: He appears well-developed and well-nourished.  Seen walking the hall with sister.  HENT:  Head: Normocephalic and atraumatic.  Eyes: Conjunctivae are normal. Pupils are equal, round, and reactive to light.  Neck: Normal  range of motion. Neck  supple.  Cardiovascular: Normal rate and regular rhythm.  No murmur heard. Respiratory: Effort normal and breath sounds normal. No respiratory distress. He has no wheezes.  GI: Soft. Bowel sounds are normal. He exhibits no distension. There is no tenderness.  Musculoskeletal: He exhibits no edema or tenderness.  Neurological: He is alert.  Flat dysarthric speech. Was unable to follow commands without tactile cues.  Mixed transcortical aphasia DTRs 3+ RLE Neg clonus, Neg hoffman's Motor: >/4/5 throughout  Skin: Skin is warm and dry.  Difficulty with repetition, Sparse spontaneous speech, difficulty with manual muscle testing in bilateral upper and lower extremities due to decreased ability to follow commands. He even has difficulty with Cueing  Motor strength antigravity plus in bilateral upper and lower extremities no focal weakness noted. Sensory testing difficult to assess secondary to aphasia.     Lab Results Last 48 Hours    No results found for this or any previous visit (from the past 48 hour(s)).    Imaging Results (Last 48 hours)    No results found.       Medical Problem List and Plan: 1.  Gait disorder with decreased balance, mixed transcortical aphasia or apraxia secondary to Left posterior MCA distribution infarct 2. DVT Prophylaxis/Anticoagulation: Pharmaceutical: Lovenox 3. Pain Management: Will continue tramadol qid 4. Mood: No signs of distress noted but patient with significant aphasia as well as lack of awareness of deficits. LCSW to follow along for evaluation and support as appropriate.  5. Neuropsych: This patient is not capable of making decisions on his own behalf. 6. Skin/Wound Care: Routine pressure relief measures.  7. Fluids/Electrolytes/Nutrition: Monitor I/O. Will check lytes in am.  8. HTN: Monitor BP tid with permissive HTN to allow for adequate perfusion. Will start low dose ace to help with 9. Dyslipidemia: On  lipitor 10. Constipation: Will start bowel program.     Post Admission Physician Evaluation: 1. Functional deficits secondary to Left posterior MCA  infarct with gait disorder, mixed transcortical aphasia and apraxia. 2. Patient is admitted to receive collaborative, interdisciplinary care between the physiatrist, rehab nursing staff, and therapy team. 3. Patient's level of medical complexity and substantial therapy needs in context of that medical necessity cannot be provided at a lesser intensity of care such as a SNF. 4. Patient has experienced substantial functional loss from his/her baseline which was documented above under the "Functional History" and "Functional Status" headings. Judging by the patient's diagnosis, physical exam, and functional history, the patient has potential for functional progress which will result in measurable gains while on inpatient rehab. These gains will be of substantial and practical use upon discharge in facilitating mobility and self-care at the household level. 5. Physiatrist will provide 24 hour management of medical needs as well as oversight of the therapy plan/treatment and provide guidance as appropriate regarding the interaction of the two. 6. 24 hour rehab nursing will assist with bladder management, bowel management, safety, skin/wound care, disease management, medication administration, pain management and patient education and help integrate therapy concepts, techniques,education, etc. 7. PT will assess and treat for/with: pre gait, gait training, endurance , safety, equipment, neuromuscular re education. Goals are: Modified independent. 8. OT will assess and treat for/with: ADLs, Cognitive perceptual skills, Neuromuscular re education, safety, endurance, equipment. Goals are: Mod I. Therapy may proceed with showering this patient. 9. SLP will assess and treat for/with: Expressive and receptive language deficits, apraxia, swallowing. Goals are:  Able to follow simple commands on a consistent basis, adequate by mouth intake  on regular diet. 10. Case Management and Social Worker will assess and treat for psychological issues and discharge planning. 11. Team conference will be held weekly to assess progress toward goals and to determine barriers to discharge. 12. Patient will receive at least 3 hours of therapy per day at least 5 days per week. 13. ELOS: 7-10 days  14. Prognosis: good     Erick Colace M.D. [facility] FAAPM&R (Sports Med, Neuromuscular Med) Diplomate [place] Board of Electrodiagnostic Med  05/01/2015

## 2015-05-01 NOTE — Progress Notes (Signed)
Report given to Ronny Bacon RN on IPR will be admitted to room #1

## 2015-05-01 NOTE — Progress Notes (Signed)
Spoke w/ [employer] rep r/t loop recorder and all is well and functioning proprely

## 2015-05-01 NOTE — Progress Notes (Signed)
Patient ID: Gary Barker, male   DOB: 23-Apr-1965, 50 y.o.   MRN: [id number] Patient admitted to 4MW01 via chair, escorted by nursing staff and family.  Patient and family verbalized understanding of rehab process.  Family requesting to speak with social worker regarding interpreter for wife who is mainly spanish speaking.  Appears to be in no immediate distress at this time. Dani Gobble, RN

## 2015-05-01 NOTE — Progress Notes (Signed)
D/c to IPR via w/c all d/c instructions and belongings given family w/ pt

## 2015-05-02 ENCOUNTER — Inpatient Hospital Stay (HOSPITAL_COMMUNITY): Payer: Medicaid Other | Admitting: Occupational Therapy

## 2015-05-02 ENCOUNTER — Inpatient Hospital Stay (HOSPITAL_COMMUNITY): Payer: Medicaid Other | Admitting: Speech Pathology

## 2015-05-02 ENCOUNTER — Inpatient Hospital Stay (HOSPITAL_COMMUNITY): Payer: Medicaid Other | Admitting: Physical Therapy

## 2015-05-02 DIAGNOSIS — I63512 Cerebral infarction due to unspecified occlusion or stenosis of left middle cerebral artery: Principal | ICD-10-CM

## 2015-05-02 DIAGNOSIS — I69398 Other sequelae of cerebral infarction: Secondary | ICD-10-CM

## 2015-05-02 DIAGNOSIS — R269 Unspecified abnormalities of gait and mobility: Secondary | ICD-10-CM

## 2015-05-02 DIAGNOSIS — R4701 Aphasia: Secondary | ICD-10-CM

## 2015-05-02 LAB — COMPREHENSIVE METABOLIC PANEL
ALT: 20 U/L (ref 17–63)
AST: 22 U/L (ref 15–41)
Albumin: 3.4 g/dL — ABNORMAL LOW (ref 3.5–5.0)
Alkaline Phosphatase: 63 U/L (ref 38–126)
Anion gap: 6 (ref 5–15)
BUN: 8 mg/dL (ref 6–20)
CHLORIDE: 105 mmol/L (ref 101–111)
CO2: 25 mmol/L (ref 22–32)
Calcium: 9.1 mg/dL (ref 8.9–10.3)
Creatinine, Ser: 0.66 mg/dL (ref 0.61–1.24)
GFR calc Af Amer: >60 mL/min (ref >60)
GFR calc non Af Amer: >60 mL/min (ref >60)
GLUCOSE: 111 mg/dL — AB (ref 65–99)
POTASSIUM: 3.9 mmol/L (ref 3.5–5.1)
SODIUM: 136 mmol/L (ref 135–145)
Total Bilirubin: 0.6 mg/dL (ref 0.3–1.2)
Total Protein: 6.5 g/dL (ref 6.5–8.1)

## 2015-05-02 LAB — CBC WITH DIFFERENTIAL/PLATELET
Basophils Absolute: 0 10*3/uL (ref 0.0–0.1)
Basophils Relative: 0 %
EOS PCT: 2 %
Eosinophils Absolute: 0.2 10*3/uL (ref 0.0–0.7)
HCT: 43.9 % (ref 39.0–52.0)
Hemoglobin: 15.3 g/dL (ref 13.0–17.0)
LYMPHS ABS: 1.4 10*3/uL (ref 0.7–4.0)
LYMPHS PCT: 18 %
MCH: 32.7 pg (ref 26.0–34.0)
MCHC: 34.9 g/dL (ref 30.0–36.0)
MCV: 93.8 fL (ref 78.0–100.0)
MONO ABS: 1 10*3/uL (ref 0.1–1.0)
Monocytes Relative: 13 %
Neutro Abs: 5.1 10*3/uL (ref 1.7–7.7)
Neutrophils Relative %: 67 %
PLATELETS: 194 10*3/uL (ref 150–400)
RBC: 4.68 MIL/uL (ref 4.22–5.81)
RDW: 11.7 % (ref 11.5–15.5)
WBC: 7.7 10*3/uL (ref 4.0–10.5)

## 2015-05-02 MED ORDER — HYDROCERIN EX CREA
TOPICAL_CREAM | Freq: Two times a day (BID) | CUTANEOUS | Status: DC
  Filled 2015-05-02: qty 113

## 2015-05-02 MED ORDER — TOPIRAMATE 25 MG PO TABS
25.0000 mg | ORAL_TABLET | Freq: Every day | ORAL | Status: DC
  Administered 2015-05-02 – 2015-05-07 (×6): 25 mg via ORAL
  Filled 2015-05-02 (×8): qty 1

## 2015-05-02 NOTE — H&P (View-Only) (Signed)
Physical Medicine and Rehabilitation Admission H&P   Chief Complaint  Patient presents with  . Right sided weakness, difficulty with speech, cognitive deficits     HPI: Gary Barker is a 49 y.o. male with history of tobacco use, recent gout flare affecting bilateral thumbs and heavy alcohol use who was admitted on 04/26/15 am with right sided weakness and confusion. He was last seen normal the night before. CTA head/neck limited but excluded large vessel occlusion. MRI/MRA brain done revealing extensive areas of non-hemorrhagic infarct throughout much of L-MCA territory sparing basal ganglia, focal high grade stenosis or filling defect in L-MCA distal M1 segment and approximately 50% mid basilar stenosis. 2D echo with EF 55-60% with no wall abnormality and grade 1 diastolic dysfunction. TEE with EF 55-60% and no wall abnormality. No thrombus or PFO seen and loop recorder placed by Dr. Klein on Swallow evaluation without signs of aspiration and patient placed on dysphagia 3, thin liquids. Cognitive evaluation shows mixed transcortical aphasia impacting expressive and receptive language with 25% accuracy for basic Y/N questions and ability to follow commands. He continues to have significant cognitive deficits impacting balance, gait and ability to carry out ADL tasks. CIR recommend for follow up therapy.   Patient has difficulty answering questions. He has reduced ability to follow verbal commands  Review of Systems  Unable to perform ROS: language  Eyes: Negative for blurred vision.  Cardiovascular: Negative for chest pain and palpitations.  Gastrointestinal: Positive for constipation. Negative for heartburn and nausea.      Past Medical History  Diagnosis Date  . Gout attack 04/2015  . Hyperlipidemia     Past Surgical History  Procedure Laterality Date  . Tee without cardioversion N/A 04/29/2015    Procedure: TRANSESOPHAGEAL ECHOCARDIOGRAM (TEE);  Surgeon: Mark C Skains, MD; Location: [facility]; Service: Cardiovascular; Laterality: N/A;  . Ep implantable device N/A 04/30/2015    Procedure: Loop Recorder Insertion; Surgeon: Steven C Klein, MD; Location: [facility] INVASIVE CV LAB; Service: Cardiovascular; Laterality: N/A;    Family History  Problem Relation Age of Onset  . Seizures Mother   . Diabetes Brother   . Hepatitis C Mother   . Fibromyalgia Mother     Social History: Lives with wife. 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 His family lives in [place]. Per reports that he has never smoked. He does not have any smokeless tobacco history on file. Per reports that he drinks alcohol--"a lot" per mother. Per reports that he does not use illicit drugs.    Allergies  Allergen Reactions  . Fish Allergy     SWELLING   . Shellfish Allergy Swelling   Medications Prior to Admission  Medication Sig Dispense Refill  . albuterol-ipratropium (COMBIVENT) 18-103 MCG/ACT inhaler Inhale 1-2 puffs into the lungs every 6 (six) hours as needed for wheezing or shortness of breath.    . predniSONE (DELTASONE) 10 MG tablet Take 10 mg by mouth See admin instructions.       Home: Home Living Family/patient expects to be discharged to:: Private residence Living Arrangements: Spouse/significant other, Children Available Help at Discharge: Family, Available PRN/intermittently Type of Home: House Home Access: Stairs to enter Entrance Stairs-Number of Steps: 4 Entrance Stairs-Rails: None Home Layout: Two level, Bed/bath upstairs Alternate Level Stairs-Number of Steps: 11 Alternate Level Stairs-Rails: Right Bathroom Shower/Tub: Tub/shower unit Bathroom Toilet: Standard Bathroom Accessibility: Yes Home Equipment: None Lives With: Family  Functional History: Prior Function Level of   Independence: Independent Comments: Pt works  daytime at plant. Had recently been off due to gout flareup. Family worried about money. Sister in from [place] and she is asking about getting financial help for this family. Asked CM for SW consult.   Functional Status:  Mobility: Bed Mobility Overal bed mobility: Needs Assistance Bed Mobility: Supine to Sit Supine to sit: Supervision General bed mobility comments: at EOB on arrival with family Transfers Overall transfer level: Needs assistance Equipment used: None Transfers: Sit to/from Stand Sit to Stand: Min guard General transfer comment: needs incr time to static stand . pt reaching out with bil UE for balance Ambulation/Gait Ambulation/Gait assistance: Min assist, Mod assist Ambulation Distance (Feet): 400 Feet Assistive device: None Gait Pattern/deviations: Step-through pattern, Decreased step length - right, Decreased dorsiflexion - right, Decreased stance time - right, Decreased weight shift to right, Ataxic, Staggering left, Staggering right General Gait Details: Pt able to ambulate but needing min assist to mod assist at times as he has right knee instability and decr safety as he is unaware of deficits decreasing his safety with mobility tasks. Pt at high risk of falls due to weakness right hemibody and cognitive issues affecting safety. Pt unable to express his thoughts due to aphasia as well. Pt unable to name his mother, brother or sister. Frustrated at times due to this. Noted right LE fatigue as pt continued gait. Gait velocity interpretation: <1.8 ft/sec, indicative of risk for recurrent falls    ADL: ADL Overall ADL's : Needs assistance/impaired Lower Body Dressing: Moderate assistance Lower Body Dressing Details (indicate cue type and reason): pt abel to doff shoes crossing bil LE but required family (A) to don / lace / tie Toilet Transfer: Min guard Functional mobility during ADLs: Minimal assistance General ADL Comments: pt with balance assessment below. pt  with incr fall risk with narrowed base of support, task that require visual scanning with ambulation, cogntiive challenges with ambulation required and tantum standing. pt with lob with vision occlusion. So patient is fall risk going from dark room to a room full of light at night, fall risk for stepping into a tub or on a curb, fall risk with narrowed spaces between furniture in living room or in front of a toilet, fall risk if speed changes like urgency to void occurs.   Cognition: Cognition Overall Cognitive Status: Impaired/Different from baseline Arousal/Alertness: Awake/alert Orientation Level: Oriented to person, Oriented to situation, Oriented to place Attention: Selective Selective Attention: Impaired Selective Attention Impairment: Verbal basic, Functional basic Safety/Judgment: Impaired Cognition Arousal/Alertness: Awake/alert Behavior During Therapy: Flat affect Overall Cognitive Status: Impaired/Different from baseline Area of Impairment: Following commands, Awareness Memory: Decreased short-term memory Following Commands: Follows multi-step commands inconsistently Awareness: Emergent General Comments: pt provided very quick fire commands with decr speed in response. tp with an echolia communication while performing balance assessment. pt repeating "turn Left turn Left" but without any attempts to walk L and no longer the focus of our session Difficult to assess due to: (significant expressive and receptive aphasia)   Blood pressure 163/97, pulse 94, temperature 97.8 F (36.6 C), temperature source Oral, resp. rate 20, height 5' 9" (1.753 m), weight 61 kg (134 lb 7.7 oz), SpO2 100 %. Physical Exam  Nursing note and vitals reviewed. Constitutional: He appears well-developed and well-nourished.  Seen walking the hall with sister.  HENT:  Head: Normocephalic and atraumatic.  Eyes: Conjunctivae are normal. Pupils are equal, round, and reactive to light.  Neck: Normal  range of motion. Neck   supple.  Cardiovascular: Normal rate and regular rhythm.  No murmur heard. Respiratory: Effort normal and breath sounds normal. No respiratory distress. He has no wheezes.  GI: Soft. Bowel sounds are normal. He exhibits no distension. There is no tenderness.  Musculoskeletal: He exhibits no edema or tenderness.  Neurological: He is alert.  Flat dysarthric speech. Was unable to follow commands without tactile cues.  Mixed transcortical aphasia DTRs 3+ RLE Neg clonus, Neg hoffman's Motor: >/4/5 throughout  Skin: Skin is warm and dry.  Difficulty with repetition, Sparse spontaneous speech, difficulty with manual muscle testing in bilateral upper and lower extremities due to decreased ability to follow commands. He even has difficulty with Cueing  Motor strength antigravity plus in bilateral upper and lower extremities no focal weakness noted. Sensory testing difficult to assess secondary to aphasia.     Lab Results Last 48 Hours    No results found for this or any previous visit (from the past 48 hour(s)).    Imaging Results (Last 48 hours)    No results found.       Medical Problem List and Plan: 1.  Gait disorder with decreased balance, mixed transcortical aphasia or apraxia secondary to Left posterior MCA distribution infarct 2. DVT Prophylaxis/Anticoagulation: Pharmaceutical: Lovenox 3. Pain Management: Will continue tramadol qid 4. Mood: No signs of distress noted but patient with significant aphasia as well as lack of awareness of deficits. LCSW to follow along for evaluation and support as appropriate.  5. Neuropsych: This patient is not capable of making decisions on his own behalf. 6. Skin/Wound Care: Routine pressure relief measures.  7. Fluids/Electrolytes/Nutrition: Monitor I/O. Will check lytes in am.  8. HTN: Monitor BP tid with permissive HTN to allow for adequate perfusion. Will start low dose ace to help with 9. Dyslipidemia: On  lipitor 10. Constipation: Will start bowel program.     Post Admission Physician Evaluation: 1. Functional deficits secondary to Left posterior MCA  infarct with gait disorder, mixed transcortical aphasia and apraxia. 2. Patient is admitted to receive collaborative, interdisciplinary care between the physiatrist, rehab nursing staff, and therapy team. 3. Patient's level of medical complexity and substantial therapy needs in context of that medical necessity cannot be provided at a lesser intensity of care such as a SNF. 4. Patient has experienced substantial functional loss from his/her baseline which was documented above under the "Functional History" and "Functional Status" headings. Judging by the patient's diagnosis, physical exam, and functional history, the patient has potential for functional progress which will result in measurable gains while on inpatient rehab. These gains will be of substantial and practical use upon discharge in facilitating mobility and self-care at the household level. 5. Physiatrist will provide 24 hour management of medical needs as well as oversight of the therapy plan/treatment and provide guidance as appropriate regarding the interaction of the two. 6. 24 hour rehab nursing will assist with bladder management, bowel management, safety, skin/wound care, disease management, medication administration, pain management and patient education and help integrate therapy concepts, techniques,education, etc. 7. PT will assess and treat for/with: pre gait, gait training, endurance , safety, equipment, neuromuscular re education. Goals are: Modified independent. 8. OT will assess and treat for/with: ADLs, Cognitive perceptual skills, Neuromuscular re education, safety, endurance, equipment. Goals are: Mod I. Therapy may proceed with showering this patient. 9. SLP will assess and treat for/with: Expressive and receptive language deficits, apraxia, swallowing. Goals are:  Able to follow simple commands on a consistent basis, adequate by mouth intake   on regular diet. 10. Case Management and Social Worker will assess and treat for psychological issues and discharge planning. 11. Team conference will be held weekly to assess progress toward goals and to determine barriers to discharge. 12. Patient will receive at least 3 hours of therapy per day at least 5 days per week. 13. ELOS: 7-10 days  14. Prognosis: good     Keviana Guida E. Sheila Ocasio M.D. [facility] FAAPM&R (Sports Med, Neuromuscular Med) Diplomate Am Board of Electrodiagnostic Med  05/01/2015             

## 2015-05-02 NOTE — IPOC Note (Signed)
Overall Plan of Care [facility]) Patient Details Name: Gary Barker MRN: [id number] DOB: 30-Dec-1965  Admitting Diagnosis: L MCA INFARCT  Hospital Problems: Active Problems:   Acute ischemic left MCA stroke (HCC)     Functional Problem List: Nursing Endurance, Medication Management, Motor, Safety, Perception, Pain  PT Balance, Safety, Perception, Pain, Endurance  OT Balance, Cognition, Endurance, Motor, Pain, Perception, Safety  SLP Cognition, Safety, Linguistic  TR         Basic ADL's: OT Eating, Grooming, Bathing, Dressing, Toileting     Advanced  ADL's: OT Simple Meal Preparation     Transfers: PT Bed Mobility, Bed to Chair, Car, Furniture, Floor  OT Toilet, Tub/Shower     Locomotion: PT Ambulation, Stairs     Additional Impairments: OT Fuctional Use of Upper Extremity  SLP Communication, Social Cognition comprehension, expression Attention, Awareness, Problem Solving  TR      Anticipated Outcomes Item Anticipated Outcome  Self Feeding Mod I  Swallowing      Basic self-care  Mod I  Toileting  Mod I   Bathroom Transfers Mod I  Bowel/Bladder  Mod I  Transfers  mod I  Locomotion  mod I in supervised environment  Communication  Mod assist   Cognition  Mod assist  Pain  < 4  Safety/Judgment  Supervision   Therapy Plan: PT Intensity: Minimum of 1-2 x/day ,45 to 90 minutes PT Frequency: 5 out of 7 days PT Duration Estimated Length of Stay: 7-10 days OT Intensity: Minimum of 1-2 x/day, 45 to 90 minutes OT Frequency: 5 out of 7 days OT Duration/Estimated Length of Stay: 7-10 days SLP Intensity: Minumum of 1-2 x/day, 30 to 90 minutes SLP Frequency: 3 to 5 out of 7 days SLP Duration/Estimated Length of Stay: 7-10 days        Team Interventions: Nursing Interventions Patient/Family Education, Medication Management, Disease Management/Prevention, Pain Management, Cognitive Remediation/Compensation, Discharge Planning  PT interventions Ambulation/gait  training, Balance/vestibular training, Cognitive remediation/compensation, Community reintegration, Functional mobility training, Disease management/prevention, Discharge planning, Neuromuscular re-education, UE/LE Coordination activities, Visual/perceptual remediation/compensation, Therapeutic Activities, Stair training, Patient/family education, Therapeutic Exercise, Psychosocial support  OT Interventions Warden/ranger, Cognitive remediation/compensation, Discharge planning, Disease mangement/prevention, DME/adaptive equipment instruction, Community reintegration, Functional mobility training, Neuromuscular re-education, Pain management, Patient/family education, Psychosocial support, Self Care/advanced ADL retraining, Therapeutic Activities, Therapeutic Exercise, UE/LE Strength taining/ROM, UE/LE Coordination activities  SLP Interventions Cognitive remediation/compensation, Cueing hierarchy, Environmental controls, Multimodal communication approach, Patient/family education, Functional tasks, Internal/external aids, Speech/Language facilitation  TR Interventions    SW/CM Interventions Discharge Planning, Psychosocial Support, Patient/Family Education    Team Discharge Planning: Destination: PT-Home ,OT- Home , SLP-Home Projected Follow-up: PT-24 hour supervision/assistance, OT-  24 hour supervision/assistance, Home health OT (TBD), SLP-24 hour supervision/assistance, Outpatient SLP Projected Equipment Needs: PT-None recommended by PT, OT- To be determined, SLP-None recommended by SLP Equipment Details: PT- , OT-  Patient/family involved in discharge planning: PT- Patient, Family member/caregiver,  OT-Patient, Family member/caregiver, SLP-Patient, Family member/caregiver  MD ELOS: 7-10 days Medical Rehab Prognosis:  Excellent Assessment: The patient has been admitted for CIR therapies with the diagnosis of left MCA infarct. The team will be addressing functional mobility, strength,  stamina, balance, safety, adaptive techniques and equipment, self-care, bowel and bladder mgt, patient and caregiver education, NMR, visual-perceptual awareness, cognition, language/speech aids, coping skills , community reintegration, . Goals have been set at mod I for mobility and self-care/ADL's and mod assist cognition and communication.    Ranelle Oyster, MD, FAAPMR      See  Team Conference Notes for weekly updates to the plan of care

## 2015-05-02 NOTE — Progress Notes (Signed)
50 y.o. male with history of tobacco use, recent gout flare affecting bilateral thumbs and heavy alcohol use who was admitted on 04/26/15 am with right sided weakness and confusion. He was last seen normal the night before. CTA head/neck limited but excluded large vessel occlusion. MRI/MRA brain done revealing extensive areas of non-hemorrhagic infarct throughout much of L-MCA territory sparing basal ganglia, focal high grade stenosis or filling defect in L-MCA distal M1 segment and approximately 50% mid basilar stenosis. 2D echo with EF 55-60% with no wall abnormality and grade 1 diastolic dysfunction. TEE with EF 55-60% and no wall abnormality. No thrombus or PFO seen and loop recorder placed by Dr. Caryl Comes on [facility] evaluation without signs of aspiration and patient placed on dysphagia 3, thin liquids  Subjective/Complaints: Slept ok Difficulty naming family members in pictures, able to name ring and watch not stethoscope ROS- unable aphasia Objective: Vital Signs: Blood pressure 149/87, pulse 75, temperature 98.1 F (36.7 C), temperature source Oral, resp. rate 18, height '5\' 7"'  (1.702 m), weight 61.644 kg (135 lb 14.4 oz), SpO2 96 %. No results found. Results for orders placed or performed during the hospital encounter of 05/01/15 (from the past 72 hour(s))  CBC WITH DIFFERENTIAL     Status: None   Collection Time: 05/02/15  6:00 AM  Result Value Ref Range   WBC 7.7 4.0 - 10.5 K/uL   RBC 4.68 4.22 - 5.81 MIL/uL   Hemoglobin 15.3 13.0 - 17.0 g/dL   HCT 43.9 39.0 - 52.0 %   MCV 93.8 78.0 - 100.0 fL   MCH 32.7 26.0 - 34.0 pg   MCHC 34.9 30.0 - 36.0 g/dL   RDW 11.7 11.5 - 15.5 %   Platelets 194 150 - 400 K/uL   Neutrophils Relative % 67 %   Neutro Abs 5.1 1.7 - 7.7 K/uL   Lymphocytes Relative 18 %   Lymphs Abs 1.4 0.7 - 4.0 K/uL   Monocytes Relative 13 %   Monocytes Absolute 1.0 0.1 - 1.0 K/uL   Eosinophils Relative 2 %   Eosinophils Absolute 0.2 0.0 - 0.7 K/uL   Basophils Relative  0 %   Basophils Absolute 0.0 0.0 - 0.1 K/uL  Comprehensive metabolic panel     Status: Abnormal   Collection Time: 05/02/15  6:00 AM  Result Value Ref Range   Sodium 136 135 - 145 mmol/L   Potassium 3.9 3.5 - 5.1 mmol/L   Chloride 105 101 - 111 mmol/L   CO2 25 22 - 32 mmol/L   Glucose, Bld 111 (H) 65 - 99 mg/dL   BUN 8 6 - 20 mg/dL   Creatinine, Ser 0.66 0.61 - 1.24 mg/dL   Calcium 9.1 8.9 - 10.3 mg/dL   Total Protein 6.5 6.5 - 8.1 g/dL   Albumin 3.4 (L) 3.5 - 5.0 g/dL   AST 22 15 - 41 U/L   ALT 20 17 - 63 U/L   Alkaline Phosphatase 63 38 - 126 U/L   Total Bilirubin 0.6 0.3 - 1.2 mg/dL   GFR calc non Af Amer >60 >60 mL/min   GFR calc Af Amer >60 >60 mL/min    Comment: (NOTE) The eGFR has been calculated using the CKD EPI equation. This calculation has not been validated in all clinical situations. eGFR's persistently <60 mL/min signify possible Chronic Kidney Disease.    Anion gap 6 5 - 15     HEENT: normal Cardio: RRR and no murmurs Resp: CTA B/L and Unlabored GI: BS positive  and not tender Extremity:  No Edema Skin:   Intact Neuro: Alert/Oriented and Aphasic Musc/Skel:  Normal Gen NAD   Assessment/Plan: 1. 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 Physiatrist is providing close team supervision and 24 hour management of active medical problems listed below. Physiatrist and rehab team continue to assess barriers to discharge/monitor patient progress toward functional and medical goals. FIM:                                   Medical Problem List and Plan: 1.   Gait disorder with decreased balance, mixed transcortical aphasia or apraxia secondary to Left posterior MCA distribution infarct- Initiate rehab as discussed with pt   2.  DVT Prophylaxis/Anticoagulation: Pharmaceutical: Lovenox 3. Pain Management: Will continue tramadol qid, has headache relieved with tramadol.doesn't last long enough, start Topamax 4. Mood: No signs of distress noted but patient with significant aphasia as well as lack of awareness of deficits. LCSW to follow along for evaluation and support as appropriate.   5. Neuropsych: This patient is not capable of making decisions on his own behalf. 6. Skin/Wound Care: Routine pressure relief measures.   7. Fluids/Electrolytes/Nutrition: Monitor I/O. Will check lytes in am.   8. HTN: Monitor BP tid with permissive HTN to allow for adequate perfusion. Will start low dose ace 149/87 9. Dyslipidemia: On lipitor 10. Constipation: Will start bowel program.    LOS (Days) 1 A FACE TO [place] E 05/02/2015, 8:15 AM

## 2015-05-02 NOTE — Progress Notes (Signed)
[facility] Individual Statement of Services  Patient Name:  Gary Barker  Date:  05/02/2015  Welcome to the [facility]patient [facility].  Our goal is to provide you with an individualized program based on your diagnosis and situation, designed to meet your specific needs.  With this comprehensive rehabilitation program, you will be expected to participate in at least 3 hours of rehabilitation therapies Monday-Friday, with modified therapy programming on the weekends.  Your rehabilitation program will include the following services:  Physical Therapy (PT), Occupational Therapy (OT), Speech Therapy (ST), 24 hour per day rehabilitation nursing, Neuropsychology, Case Management (Social Worker), Rehabilitation Medicine, Nutrition Services and Pharmacy Services  Weekly team conferences will be held on Wednesdays to discuss your progress.  Your Social Worker will talk with you frequently to get your input and to update you on team discussions.  Team conferences with you and your family in attendance may also be held.  Expected length of stay:  7 to 10 days  Overall anticipated outcome:  Supervision  Depending on your progress and recovery, your program may change. Your Social Worker will coordinate services and will keep you informed of any changes. Your Social Worker's name and contact numbers are listed  below.  The following services may also be recommended but are not provided by the Inpatient [facility]:   Driving Evaluations  Home Health Rehabiltiation Services  Outpatient Rehabilitation Services  Vocational Rehabilitation   Arrangements will be made to provide these services after discharge if needed.  Arrangements include referral to agencies that provide these services.  Your insurance has been verified to be:  None at this time - Medicaid application completion pending Your primary doctor is:  None at this time - I will work on this with  you.  Pertinent information will be shared with your doctor and your insurance company.  Social Worker:  Staci Acosta, LCSW  [phone] or ([phone]  Information discussed with and copy given to patient by: Elvera Lennox, 05/02/2015, 10:18 PM

## 2015-05-02 NOTE — Evaluation (Signed)
Speech Language Pathology Assessment and Plan  Patient Details  Name: Gary Barker MRN: [id number] Date of Birth: 05/08/1965  SLP Diagnosis: Aphasia;Cognitive Impairments  Rehab Potential: Good ELOS: 7-10 days     Today's Date: 05/02/2015 SLP Individual Time: 1000-1105 SLP Individual Time Calculation (min): 65 min   Problem List:  Patient Active Problem List   Diagnosis Date Noted  . Acute ischemic left MCA stroke (HCC) 05/01/2015  . Gait disturbance, post-stroke   . Gout, chronic   . Dysphagia as late effect of cerebrovascular disease   . Aphasia as late effect of cerebrovascular accident   . Altered mental status   . Alcoholism (HCC)   . Essential hypertension   . Tobacco abuse   . CVA (cerebral infarction) 04/26/2015  . Stroke (cerebrum) (HCC) 04/26/2015  . Cerebral infarction due to unspecified mechanism   . Alcohol use disorder (HCC)   . Tobacco use disorder    Past Medical History:  Past Medical History  Diagnosis Date  . Gout attack 04/2015  . Hyperlipidemia    Past Surgical History:  Past Surgical History  Procedure Laterality Date  . Tee without cardioversion N/A 04/29/2015    Procedure: TRANSESOPHAGEAL ECHOCARDIOGRAM (TEE);  Surgeon: Mark C Skains, MD;  Location: [facility];  Service: Cardiovascular;  Laterality: N/A;  . Ep implantable device N/A 04/30/2015    Procedure: Loop Recorder Insertion;  Surgeon: Steven C Klein, MD;  Location: [facility];  Service: Cardiovascular;  Laterality: N/A;    Assessment / Plan / Recommendation Clinical Impression  Gary Barker is a 50 y.o. male who was admitted on 04/26/15 am with right sided weakness and confusion.  MRI/MRA brain done revealing extensive areas of non-hemorrhagic infarct throughout much of L-MCA territory sparing basal ganglia.  Pt admitted to CIR on 05/01/2015.  SLP evaluation completed on 05/02/2015 with the following results:  Pt presents with a severe transcortical aphasia.  Pt demonstrates impaired  comprehension impacting pt's ability to answer basic yes/no questions and follow 1-step commands. Verbal expression is characterized by echolalia and perseveration, difficulty naming basic, familiar objects, phonemic and semantic paraphasic errors, and telegraphic speech.  Pt has some awareness of verbal errors and becomes very frustrated by his inability to convey his needs and wants to caregivers; however, he is unable at this time to correct errors.  Pt also presents with severe cognitive deficits characterized by decreased sustained attention to tasks, decreased intellectual awareness of deficits, and poor safety awareness evidenced by impulsivity with movement and poor task organization.   SLP also administered a bedside swallow evaluation due to recent diet upgrade.   Pt demonstrated grossly intact oropharyngeal swallowing function and was able to clear large amounts of cracker from the oral cavity post swallow via multiple consecutive sips of water via straw without overt s/s of aspiration.   Recommend that pt remain on regular textures and thin liquids with no further ST follow up indicated for dysphagia; however, pt may need full supervision during meals due to cognitive deficits and motor planning impairment (?motor versus ideational apraxia noted during tasks).   Pt has experienced a significant loss in function from baseline due to the abovementioned impairments.  As a result, pt would benefit from skilled ST while inpatient in order to maximize functional independence and reduce burden of care prior to discharge.  Anticipate that pt will need close 24/7 supervision due to cognitive-linguistic impairments in addition to outpatient ST follow up.     Skilled Therapeutic Interventions            Cognitive-linguistic and bedside swallow evaluation completed with results and recommendations reviewed with patient and family. Pt's daughter, wife, and sister present.  SLP provided education regarding supported  communication techniques to maximize success with pt's functional communication, emphasizing use of visual and gestural cues, verifying communicative intent, and acknowledging pt's competence.  Pt's family verbalized understanding; however, it may be helpful to have an interpreter present for additional training with pt's wife as she does not speak English very well.  All questions were answered to pt's and family's satisfaction at this time.       SLP Assessment  Patient will need skilled Speech Lanaguage Pathology Services during CIR admission    Recommendations  SLP Diet Recommendations: Age appropriate regular solids;Thin Liquid Administration via: Cup;Straw Medication Administration: Whole meds with liquid Supervision: Patient able to self feed (Pt may need full supervision due to cognitive deficits ) Compensations: Minimize environmental distractions;Slow rate;Small sips/bites Postural Changes and/or Swallow Maneuvers: Seated upright 90 degrees Oral Care Recommendations: Oral care BID Patient destination: Home Follow up Recommendations: 24 hour supervision/assistance;Outpatient SLP Equipment Recommended: None recommended by SLP    SLP Frequency 3 to 5 out of 7 days   SLP Duration  SLP Intensity  SLP Treatment/Interventions 7-10 days   Minumum of 1-2 x/day, 30 to 90 minutes  Cognitive remediation/compensation;Cueing hierarchy;Environmental controls;Multimodal communication approach;Patient/family education;Functional tasks;Internal/external aids;Speech/Language facilitation    Pain Pain Assessment Pain Assessment: No/denies pain Faces Pain Scale: No hurt  Prior Functioning Cognitive/Linguistic Baseline: Within functional limits Type of Home: House  Lives With: Family Available Help at Discharge: Family;Available PRN/intermittently Vocation: Full time employment  Function:  Eating Eating   Modified Consistency Diet: No Eating Assist Level: Set up assist for    Eating Set Up Assist For: Opening containers       Cognition Comprehension Comprehension assist level: Understands basic 25 - 49% of the time/ requires cueing 50 - 75% of the time  Expression   Expression assist level: Expresses basic 25 - 49% of the time/requires cueing 50 - 75% of the time. Uses single words/gestures.  Social Interaction Social Interaction assist level: Interacts appropriately 25 - 49% of time - Needs frequent redirection.  Problem Solving Problem solving assist level: Solves basic less than 25% of the time - needs direction nearly all the time or does not effectively solve problems and may need a restraint for safety  Memory Memory assist level: Recognizes or recalls less than 25% of the time/requires cueing greater than 75% of the time   Short Term Goals: Week 1: SLP Short Term Goal 1 (Week 1): Pt will name basic, familiar objects for 75% accuracy with max assist verbal cues.  SLP Short Term Goal 2 (Week 1): Pt will follow 1 step commands for 75% accuracy with max assist verbal, visual, and tactile cues.  SLP Short Term Goal 3 (Week 1): Pt will sustain his attention to task for 5-7 minute intervals with mod assist verbal cues for redirection.  SLP Short Term Goal 4 (Week 1): Pt will use call bell in 50% of observable opportunities with max assist verbal and visual cues.   SLP Short Term Goal 5 (Week 1): Pt will return demonstration of at least 2 safety precautions with max assist verbal and visual cues.    Refer to Care Plan for Long Term Goals  Recommendations for other services: None  Discharge Criteria: Patient will be discharged from SLP if patient refuses treatment 3 consecutive times without medical reason, if treatment goals not met, if  there is a change in medical status, if patient makes no progress towards goals or if patient is discharged from hospital.  The above assessment, treatment plan, treatment alternatives and goals were discussed and mutually agreed  upon: by patient and by family  Page, Nicole L 05/02/2015, 5:21 PM   

## 2015-05-02 NOTE — Evaluation (Signed)
Physical Therapy Assessment and Plan  Patient Details  Name: Gary Barker MRN: [id number] Date of Birth: 18-Sep-1965  PT Diagnosis: Abnormality of gait, Cognitive deficits, Difficulty walking, Impaired cognition and Muscle weakness Rehab Potential: Good ELOS: 7-10 days   Today's Date: 05/02/2015 PT Individual Time: 0800-0905 PT Individual Time Calculation (min): 65 min    Problem List:  Patient Active Problem List   Diagnosis Date Noted  . Acute ischemic left MCA stroke ([place]) 05/01/2015  . Gait disturbance, post-stroke   . Gout, chronic   . Dysphagia as late effect of cerebrovascular disease   . Aphasia as late effect of cerebrovascular accident   . Altered mental status   . Alcoholism ([place])   . Essential hypertension   . Tobacco abuse   . CVA (cerebral infarction) 04/26/2015  . Stroke (cerebrum) ([place]) 04/26/2015  . Cerebral infarction due to unspecified mechanism   . Alcohol use disorder ([place])   . Tobacco use disorder     Past Medical History:  Past Medical History  Diagnosis Date  . Gout attack 04/2015  . Hyperlipidemia    Past Surgical History:  Past Surgical History  Procedure Laterality Date  . Tee without cardioversion N/A 04/29/2015    Procedure: TRANSESOPHAGEAL ECHOCARDIOGRAM (TEE);  Surgeon: Jerline Pain, MD;  Location: [place] [facility];  Service: Cardiovascular;  Laterality: N/A;  . Ep implantable device N/A 04/30/2015    Procedure: Loop Recorder Insertion;  Surgeon: Deboraha Sprang, MD;  Location: [place] [facility];  Service: Cardiovascular;  Laterality: N/A;    Assessment & Plan Clinical Impression: Gary Barker is a 50 y.o. male with history of tobacco use, recent gout flare affecting bilateral thumbs and heavy alcohol use who was admitted on 04/26/15 am with right sided weakness and confusion. He was last seen normal the night before. CTA head/neck limited but excluded large vessel occlusion. MRI/MRA brain done revealing extensive areas of non-hemorrhagic infarct  throughout much of L-MCA territory sparing basal ganglia, focal high grade stenosis or filling defect in L-MCA distal M1 segment and approximately 50% mid basilar stenosis. 2D echo with EF 55-60% with no wall abnormality and grade 1 diastolic dysfunction. TEE with EF 55-60% and no wall abnormality. No thrombus or PFO seen and loop recorder placed by Dr. Caryl Comes on [facility] evaluation without signs of aspiration and patient placed on dysphagia 3, thin liquids. Cognitive evaluation shows mixed transcortical aphasia impacting expressive and receptive language with 25% accuracy for basic Y/N questions and ability to follow commands. He continues to have significant cognitive deficits impacting balance, gait and ability to carry out ADL tasks. CIR recommend for follow up therapy. Patient transferred to CIR on 05/01/2015 .   Patient currently requires supervisionto min guard with mobility secondary to muscle weakness, motor apraxia, decreased initiation, decreased attention, decreased awareness, decreased problem solving, decreased safety awareness, decreased memory and delayed processing and decreased standing balance, decreased postural control and decreased balance strategies.  Prior to hospitalization, patient was independent  with mobility and lived with Family in a House home.  Home access is 4Stairs to enter.  Patient will benefit from skilled PT intervention to maximize safe functional mobility, minimize fall risk and decrease caregiver burden for planned discharge home with 24 hour supervision.  Anticipate patient will not need PT follow up at discharge.  PT - End of Session Activity Tolerance: Tolerates 30+ min activity with multiple rests Endurance Deficit: Yes Endurance Deficit Description: required multiple rest breaks, very fatigued after completion of session PT Assessment Rehab  Potential (ACUTE/IP ONLY): Good Barriers to Discharge: Decreased caregiver support (wife works, kids at home not old  enough to provide safe S, daughtet lives out of town) PT Patient demonstrates impairments in the following area(s): Balance;Safety;Perception;Pain;Endurance PT Transfers Functional Problem(s): Bed Mobility;Bed to Chair;Car;Furniture;Floor PT Locomotion Functional Problem(s): Ambulation;Stairs PT Plan PT Intensity: Minimum of 1-2 x/day ,45 to 90 minutes PT Frequency: 5 out of 7 days PT Duration Estimated Length of Stay: 7-10 days PT Treatment/Interventions: Ambulation/gait training;Balance/vestibular training;Cognitive remediation/compensation;Community reintegration;Functional mobility training;Disease management/prevention;Discharge planning;Neuromuscular re-education;UE/LE Coordination activities;Visual/perceptual remediation/compensation;Therapeutic Activities;Stair training;Patient/family education;Therapeutic Exercise;Psychosocial support PT Transfers Anticipated Outcome(s): mod I PT Locomotion Anticipated Outcome(s): mod I in supervised environment PT Recommendation Recommendations for Other Services: Speech consult Follow Up Recommendations: 24 hour supervision/assistance Patient destination: Home Equipment Recommended: None recommended by PT  Skilled Therapeutic Intervention Pt received supine in bed, no c/o or evidence of pain and agreeable to treatment. Daughter present for evaluation and assisted with gathering history and details regarding home environment. States he is very inconsistent with yes/no, and will often respond "no" when asked about pain but shortly after will report headache. Pt with global aphasia, use of gesturing, demonstration throughout session to assist with engaging pt in all mobility activities. Assessed all mobility as described below with min guard to S overall. Pt demonstrates significantly impaired intellectual impairment, poor awareness of deficits, impulsivity, as well as balance impairments, RLE weakness with occasional hyperextension/buckling of R knee  during mobility activities. Pt and daughter educated regarding rehab process, anticipated outcomes and expectation that pt will continue to require 24/7 S at d/c due to cognitive and communication impairments. Pt remained seated in bed at completion of session, all needs in reach and alarm intact at completion of session. RN alerted to pt position and impulsivity, need for safety belt if sitting up in chair.   PT Evaluation Precautions/Restrictions Precautions Precautions: Fall Restrictions Weight Bearing Restrictions: No General Chart Reviewed: Yes Family/Caregiver Present: Yes (daughter )  Pain Pain Assessment Pain Assessment: No/denies pain Pain Score: 0-No pain Home Living/Prior Functioning Home Living Available Help at Discharge: Family;Available PRN/intermittently Type of Home: House Home Access: Stairs to enter [employer] of Steps: 4 Entrance Stairs-Rails: None Home Layout: Two level;Bed/bath upstairs Alternate Level Stairs-Number of Steps: 11 Alternate Level Stairs-Rails: Right Bathroom Shower/Tub: Chiropodist: Standard Bathroom Accessibility: Yes  Lives With: Family Prior Function Level of Independence: Independent with basic ADLs;Independent with homemaking with ambulation;Independent with gait;Independent with transfers  Able to Take Stairs?: Yes Driving: Yes Vocation: Full time employment Comments: enjoys spending time with family  Vision/Perception  Vision - Assessment Additional Comments: To be further assessed as communication/accuracy improves  Cognition Overall Cognitive Status: Impaired/Different from baseline Arousal/Alertness: Awake/alert Orientation Level: Oriented to person;Disoriented to time;Disoriented to situation;Disoriented to place Attention: Selective Selective Attention: Impaired Awareness: Impaired Awareness Impairment: Intellectual impairment Safety/Judgment: Impaired Sensation Sensation Light Touch:  Appears Intact Stereognosis: Not tested Hot/Cold: Not tested Proprioception: Not tested (unable to formally assess d/t inaccurate responses, aphasia) Coordination Gross Motor Movements are Fluid and Coordinated: Yes Heel Shin Test: difficult to assess d/t aphasia, impaired command following Motor  Motor Motor: Hemiplegia Motor - Skilled Clinical Observations: mild R extremity hemiparesis  Mobility Bed Mobility Bed Mobility: Rolling Right;Rolling Left;Supine to Sit;Sit to Supine Rolling Right: 5: Supervision Rolling Right Details: Verbal cues for precautions/safety Rolling Left: 5: Supervision Rolling Left Details: Verbal cues for precautions/safety Supine to Sit: 5: Supervision Supine to Sit Details: Verbal cues for precautions/safety Sit to Supine: 5: Supervision Sit to  Supine - Details: Verbal cues for precautions/safety Transfers Transfers: Yes Stand Pivot Transfers: 4: Min guard Locomotion  Ambulation Ambulation: Yes Ambulation/Gait Assistance: 4: Min guard Ambulation Distance (Feet): 200 Feet Assistive device: None Gait Gait: Yes Gait Pattern: Impaired Gait Pattern: Right genu recurvatum;Trendelenburg;Decreased weight shift to right;Decreased step length - left Stairs / Additional Locomotion Stairs: Yes Stairs Assistance: 4: Min guard Stair Management Technique: Two rails;Alternating pattern;Forwards Number of Stairs: 12 Height of Stairs: 3 Wheelchair Mobility Wheelchair Mobility: No (pt ambulatory)  Trunk/Postural Assessment  Cervical Assessment Cervical Assessment: Within Functional Limits Thoracic Assessment Thoracic Assessment: Within Functional Limits Lumbar Assessment Lumbar Assessment: Within Functional Limits Postural Control Postural Control: Deficits on evaluation (occasional mod LOB with mobility, delayed righting reactions)  Balance Balance Balance Assessed: Yes Standardized Balance Assessment Standardized Balance Assessment: Berg Balance  Test;Dynamic Gait Index Berg Balance Test Sit to Stand: Able to stand without using hands and stabilize independently Standing Unsupported: Able to stand 2 minutes with supervision Sitting with Back Unsupported but Feet Supported on Floor or Stool: Able to sit safely and securely 2 minutes Stand to Sit: Sits safely with minimal use of hands Transfers: Able to transfer safely, minor use of hands Standing Unsupported with Eyes Closed: Able to stand 10 seconds with supervision Standing Ubsupported with Feet Together: Able to place feet together independently and stand for 1 minute with supervision From Standing, Reach Forward with Outstretched Arm: Reaches forward but needs supervision From Standing Position, Pick up Object from Floor: Able to pick up shoe, needs supervision From Standing Position, Turn to Look Behind Over each Shoulder: Looks behind from both sides and weight shifts well Turn 360 Degrees: Able to turn 360 degrees safely in 4 seconds or less Standing Unsupported, Alternately Place Feet on Step/Stool: Able to stand independently and complete 8 steps >20 seconds Standing Unsupported, One Foot in Front: Loses balance while stepping or standing (LOB on initial 2 trials, then able to hold 30 seconds with S) Standing on One Leg: Able to lift leg independently and hold > 10 seconds Total Score: 44 Dynamic Gait Index Level Surface: Mild Impairment Change in Gait Speed: Mild Impairment Gait with Horizontal Head Turns: Mild Impairment Gait with Vertical Head Turns: Mild Impairment Gait and Pivot Turn: Moderate Impairment Step Over Obstacle: Mild Impairment Step Around Obstacles: Mild Impairment Steps: Mild Impairment Total Score: 15 Static Sitting Balance Static Sitting - Balance Support: No upper extremity supported;Feet supported Static Sitting - Level of Assistance: 6: Modified independent (Device/Increase time) Dynamic Sitting Balance Dynamic Sitting - Balance Support: No upper  extremity supported;Feet supported;During functional activity Dynamic Sitting - Level of Assistance: 5: Stand by assistance Static Standing Balance Static Standing - Balance Support: No upper extremity supported Static Standing - Level of Assistance: 5: Stand by assistance Static Stance: Eyes closed Static Stance: Eyes Closed: 30 sec with S Dynamic Standing Balance Dynamic Standing - Balance Support: No upper extremity supported;During functional activity Dynamic Standing - Level of Assistance: 5: Stand by assistance Dynamic Standing - Balance Activities: Reaching for objects;Reaching across midline Extremity Assessment  RUE Assessment RUE Assessment: Within Functional Limits LUE Assessment LUE Assessment: Within Functional Limits RLE Assessment RLE Assessment: Within Functional Limits (knee flexion 4/5, remainder 4+/5 to 5/5 throughout) LLE Assessment LLE Assessment: Within Functional Limits   See Function Navigator for Current Functional Status.   Refer to Care Plan for Long Term Goals  Recommendations for other services: None  Discharge Criteria: Patient will be discharged from PT if patient refuses treatment 3 consecutive  times without medical reason, if treatment goals not met, if there is a change in medical status, if patient makes no progress towards goals or if patient is discharged from hospital.  The above assessment, treatment plan, treatment alternatives and goals were discussed and mutually agreed upon: by patient and by family  Luberta Mutter 05/02/2015, 10:12 AM

## 2015-05-02 NOTE — Progress Notes (Signed)
Ankit Karis Juba, MD Physician Signed Physical Medicine and Rehabilitation Consult Note 04/28/2015 3:06 PM  Related encounter: ED to Hosp-Admission (Discharged) from 04/26/2015 in [facility] 5W MEDICAL    Expand All Collapse All        Physical Medicine and Rehabilitation Consult  Reason for Consult: Right sided weakness, speech difficulty, cognitive deficits Referring Physician: Dr. Jennette Kettle  HPI: Gary Barker is a 50 y.o. male with history of tobacco use, recent gout flare and heavy alcohol use who was admitted on 04/26/15 am with right sided weakness and confusion. He was last seen normal the night before. CTA head/neck limited but excluded large vessel occlusion. MRI/MRA brain done revealing extensive areas of non-hemorrhagic infarct throughout much of L-MCA territory sparing basal ganglia, focal high grade stenosis or filling defect in L-MCA distal M1 segment and approximately 50% mid basilar stenosis. 2D echo with EF 55-60% with no wall abnormality and grade 1 diastolic dysfunction. Swallow evaluation without signs of aspiration and patient placed on dysphagia 3, thin liquids. Cognitive evaluation shows mixed transcortical aphasia impacting expressive and receptive language with 25% accuracy for basic Y/N questions and ability to follow commands. PT evaluation completed today revealing right knee instability with ataxia, staggering gait and impaired cognition impacting safety awareness. Neurology recommended ASA for stroke prevention and TEE ordered for work up.    Review of Systems  Unable to perform ROS: language      History reviewed. No pertinent past medical history.    History reviewed. No pertinent past surgical history.    History reviewed. No pertinent family history.    Social History: Lives with family. reports that he has never smoked. He does not have any smokeless tobacco history on file. He reports that he drinks alcohol. He reports that he does  not use illicit drugs.    Allergies: No Known Allergies    Medications Prior to Admission  Medication Sig Dispense Refill  . albuterol-ipratropium (COMBIVENT) 18-103 MCG/ACT inhaler Inhale 1-2 puffs into the lungs every 6 (six) hours as needed for wheezing or shortness of breath.    . predniSONE (DELTASONE) 10 MG tablet Take 10 mg by mouth See admin instructions.       Home: Home Living Family/patient expects to be discharged to:: Private residence Living Arrangements: Spouse/significant other, Children (87 year old and 28 year old daughters) Available Help at Discharge: Family, Available PRN/intermittently (wife works 11pm to 7am; extended family lives out of town) Type of Home: House Home Access: Stairs to enter Secretary/administrator of Steps: 4 Entrance Stairs-Rails: None Home Layout: Two level, Bed/bath upstairs Alternate Level Stairs-Number of Steps: 11 Alternate Level Stairs-Rails: Right Bathroom Shower/Tub: Engineer, manufacturing systems: Standard Bathroom Accessibility: Yes Home Equipment: None Lives With: Family  Functional History: Prior Function Level of Independence: Independent Comments: Pt works daytime at [employer] Had recently been off due to gout flareup. Family worried about money. Sister in from [place] and she is asking about getting financial help for this family. Asked CM for SW consult.  Functional Status:  Mobility: Bed Mobility Overal bed mobility: Needs Assistance Bed Mobility: Supine to Sit Supine to sit: Min guard General bed mobility comments: Close guard assist for safety as pt with decr awareness of safety with mobility.  Transfers Overall transfer level: Needs assistance Equipment used: None Transfers: Sit to/from Stand Sit to Stand: Min assist General transfer comment: Pt needs min assist for steadying as he is slightly impulsive with right LE poor coordination and weakness.  Ambulation/Gait  Ambulation/Gait assistance:  Min assist, Mod assist Ambulation Distance (Feet): 110 Feet Assistive device: None Gait Pattern/deviations: Step-through pattern, Decreased step length - right, Decreased dorsiflexion - right, Decreased stance time - right, Decreased weight shift to right, Ataxic, Staggering left, Staggering right General Gait Details: Pt able to ambulate but needing min assist to mod assist at times as he has right knee instability and decr safety as he is unaware of deficits decreasing his safety with mobility tasks. Pt at high risk of falls due to weakness right hemibody and cognitive issues affecting safety. Pt unable to express his thoughts due to aphasia as well. Overall, very impaired cognitively and functionally.  Gait velocity interpretation: Below normal speed for age/gender    ADL:    Cognition: Cognition Overall Cognitive Status: Difficult to assess (aphasia) Arousal/Alertness: Awake/alert Orientation Level: Other (comment) (Oriented to hospital and self clearly- more clear than AM) Attention: Selective Selective Attention: Impaired Selective Attention Impairment: Verbal basic, Functional basic Safety/Judgment: Impaired Cognition Arousal/Alertness: Awake/alert Behavior During Therapy: Flat affect Overall Cognitive Status: Difficult to assess (aphasia) Difficult to assess due to: (significant expressive and receptive aphasia)  Blood pressure 171/110, pulse 93, temperature 98.8 F (37.1 C), temperature source Oral, resp. rate 19, height  (1.753 m), weight 61 kg (134 lb 7.7 oz), SpO2 100 %. Physical Exam  Vitals reviewed. Constitutional: He appears well-developed and well-nourished.  HENT:  Head: Normocephalic and atraumatic.  Eyes: Conjunctivae and EOM are normal.  Neck: Normal range of motion. Neck supple.  Cardiovascular: Normal rate and regular rhythm.  Respiratory: Effort normal and breath sounds normal.  GI: Soft. Bowel sounds are normal.  Musculoskeletal: He exhibits  no edema or tenderness.  Neurological: He is alert.  Mixed transcortical aphasia DTRs 3+ RLE Neg clonus, Neg hoffman's Motor: >/4/5 throughout Unable to accurately assess MMT and sensation due to aphasia  Skin: Skin is warm and dry.  Psychiatric: His affect is inappropriate. His speech is delayed and tangential. Cognition and memory are impaired. He expresses inappropriate judgment.     Lab Results Last 24 Hours    Results for orders placed or performed during the hospital encounter of 04/26/15 (from the past 24 hour(s))  Folate Status: None   Collection Time: 04/27/15 3:55 PM  Result Value Ref Range   Folate 69.7 >5.9 ng/mL  Vitamin B12 Status: None   Collection Time: 04/27/15 3:55 PM  Result Value Ref Range   Vitamin B-12 330 180 - 914 pg/mL  CBC Status: None   Collection Time: 04/28/15 2:20 AM  Result Value Ref Range   WBC 8.0 4.0 - 10.5 K/uL   RBC 4.74 4.22 - 5.81 MIL/uL   Hemoglobin 15.2 13.0 - 17.0 g/dL   HCT 21.3 08.6 - 57.8 %   MCV 93.0 78.0 - 100.0 fL   MCH 32.1 26.0 - 34.0 pg   MCHC 34.5 30.0 - 36.0 g/dL   RDW 46.9 62.9 - 52.8 %   Platelets 196 150 - 400 K/uL  Basic metabolic panel Status: Abnormal   Collection Time: 04/28/15 2:20 AM  Result Value Ref Range   Sodium 138 135 - 145 mmol/L   Potassium 3.7 3.5 - 5.1 mmol/L   Chloride 107 101 - 111 mmol/L   CO2 24 22 - 32 mmol/L   Glucose, Bld 123 (H) 65 - 99 mg/dL   BUN 6 6 - 20 mg/dL   Creatinine, Ser 4.13 0.61 - 1.24 mg/dL   Calcium 8.8 (L) 8.9 - 10.3 mg/dL   GFR  calc non Af Amer >60 >60 mL/min   GFR calc Af Amer >60 >60 mL/min   Anion gap 7 5 - 15      Imaging Results (Last 48 hours)    Ct Head Wo Contrast  04/27/2015 CLINICAL DATA: 50 year old male with altered mental status EXAM: CT HEAD WITHOUT CONTRAST TECHNIQUE: Contiguous axial images were obtained from the base of  the skull through the vertex without intravenous contrast. COMPARISON: Brain CT and MRI dated 04/26/2015 FINDINGS: There are areas of cortical and subcortical hypodensity involving the left frontal, temporal, and parietal lobe in the left MCA territory distribution compatible with ischemic infarct. There has been interval increase and conspicuity of these hypodensities compared the prior CT compatible with edema. There is no acute intracranial hemorrhage. Ventricles and sulci are appropriate in size for patient's age. There is no significant mass effect. No midline shift. The visualized paranasal sinuses and mastoid air cells are well aerated. The calvarium is intact. IMPRESSION: No acute intracranial hemorrhage. Large left MCA territory ischemic infarct. Electronically Signed By: Elgie Collard M.D. On: 04/27/2015 01:07   Mr Maxine Glenn Head Wo Contrast  04/26/2015 CLINICAL DATA: RIGHT-sided weakness. Unknown last seen normal. EXAM: MRI HEAD WITHOUT CONTRAST MRA HEAD WITHOUT CONTRAST TECHNIQUE: Multiplanar, multiecho pulse sequences of the brain and surrounding structures were obtained without intravenous contrast. Angiographic images of the head were obtained using MRA technique without contrast. COMPARISON: CT head, CT angio head neck, earlier today. FINDINGS: MRI HEAD FINDINGS The patient was unable to remain motionless for the exam. Small or subtle lesions could be overlooked. Extensive gyriform restricted diffusion throughout much of the LEFT MCA territory, including frontal, temporal, insular, and parietal lobes, as well as adjacent subcortical white matter. No definite hemorrhagic transformation. Generalized atrophy premature for age. Minor white matter disease, nonspecific. No midline abnormalities. Flow voids are maintained. No extracranial soft tissue abnormalities of significance. MRA HEAD FINDINGS The internal carotid arteries are widely patent. The RIGHT anterior cerebral artery is severely  diseased, and both anterior cerebral is are predominantly supplied from the LEFT. The RIGHT MCA trunk and branches are normal. On the LEFT, distal M1 segment, there appears to be a flow reducing lesion. Patient motion degrades the axial source images. It is not clear from interrogation of those axial source images, as well as correlation with CTA, if this represents a severe stenosis or intraluminal filling defect. See for instance image 14 series 501, as well as image 98, series 5. Decreased size and number of distal LEFT MCA branches; the distal M1 lesion is nonocclusive. The basilar artery is supplied predominantly by the LEFT vertebral. BILATERAL fetal PCA segments. Mid basilar stenosis approaches 50%. IMPRESSION: Extensive areas of acute nonhemorrhagic infarction throughout much of the LEFT MCA territory, with sparing of the basal ganglia. No proximal large vessel occlusion, but there is either a focal high-grade stenosis or intraluminal filling defect in the LEFT MCA, distal M1 segment. This is not well seen on CTA. Mid basilar stenosis estimated 50%, along with basilar hypoplasia due to fetal PCA segments. Dominant LEFT vertebral. Electronically Signed By: Elsie Stain M.D. On: 04/26/2015 17:00   Mr Brain Wo Contrast  04/26/2015 CLINICAL DATA: RIGHT-sided weakness. Unknown last seen normal. EXAM: MRI HEAD WITHOUT CONTRAST MRA HEAD WITHOUT CONTRAST TECHNIQUE: Multiplanar, multiecho pulse sequences of the brain and surrounding structures were obtained without intravenous contrast. Angiographic images of the head were obtained using MRA technique without contrast. COMPARISON: CT head, CT angio head neck, earlier today. FINDINGS: MRI HEAD  FINDINGS The patient was unable to remain motionless for the exam. Small or subtle lesions could be overlooked. Extensive gyriform restricted diffusion throughout much of the LEFT MCA territory, including frontal, temporal, insular, and parietal lobes, as well as  adjacent subcortical white matter. No definite hemorrhagic transformation. Generalized atrophy premature for age. Minor white matter disease, nonspecific. No midline abnormalities. Flow voids are maintained. No extracranial soft tissue abnormalities of significance. MRA HEAD FINDINGS The internal carotid arteries are widely patent. The RIGHT anterior cerebral artery is severely diseased, and both anterior cerebral is are predominantly supplied from the LEFT. The RIGHT MCA trunk and branches are normal. On the LEFT, distal M1 segment, there appears to be a flow reducing lesion. Patient motion degrades the axial source images. It is not clear from interrogation of those axial source images, as well as correlation with CTA, if this represents a severe stenosis or intraluminal filling defect. See for instance image 14 series 501, as well as image 98, series 5. Decreased size and number of distal LEFT MCA branches; the distal M1 lesion is nonocclusive. The basilar artery is supplied predominantly by the LEFT vertebral. BILATERAL fetal PCA segments. Mid basilar stenosis approaches 50%. IMPRESSION: Extensive areas of acute nonhemorrhagic infarction throughout much of the LEFT MCA territory, with sparing of the basal ganglia. No proximal large vessel occlusion, but there is either a focal high-grade stenosis or intraluminal filling defect in the LEFT MCA, distal M1 segment. This is not well seen on CTA. Mid basilar stenosis estimated 50%, along with basilar hypoplasia due to fetal PCA segments. Dominant LEFT vertebral. Electronically Signed By: Elsie Stain M.D. On: 04/26/2015 17:00     Assessment/Plan: Diagnosis: L MCA non-hemorrhagic infarct  Labs and images independently reviewed. Records reviewed and summated above. Stroke: Continue secondary stroke prophylaxis and Risk Factor Modification listed below:  Antiplatelet therapy:  Blood Pressure Management: Continue current medication with prn's with  permisive HTN per primary team Statin Agent:  Diabetes management: Follow HbA1c Tobacco abuse: Cont counseling  1. Does the need for close, 24 hr/day medical supervision in concert with the patient's rehab needs make it unreasonable for this patient to be served in a less intensive setting? Yes  2. Co-Morbidities requiring supervision/potential complications: tobacco use (cont to counsel), recent gout flare (ensure pain does not limit therapies, cont meds), and heavy alcohol (CIWA, cont to counsel), dysphagia (Cont SLP), HTN (monitor and provide prns in accordance with increased physical exertion and pain) 3. Due to safety, skin/wound care, disease management, medication administration and patient education, does the patient require 24 hr/day rehab nursing? Yes 4. Does the patient require coordinated care of a physician, rehab nurse, PT (1-2 hrs/day, 5 days/week), OT (1-2 hrs/day, 5 days/week) and SLP (1-2 hrs/day, 5 days/week) to address physical and functional deficits in the context of the above medical diagnosis(es)? Yes Addressing deficits in the following areas: balance, endurance, locomotion, strength, transferring, bathing, dressing, feeding, toileting, cognition, speech, language, swallowing and psychosocial support 5. Can the patient actively participate in an intensive therapy program of at least 3 hrs of therapy per day at least 5 days per week? Yes 6. The potential for patient to make measurable gains while on inpatient rehab is excellent 7. Anticipated functional outcomes upon discharge from inpatient rehab are modified independent and supervision with PT, modified independent and supervision with OT, supervision and min assist with SLP. 8. Estimated rehab length of stay to reach the above functional goals is: 19-22 days. 9. Does the patient have  adequate social supports and living environment to accommodate these discharge functional goals? Potentially 10. Anticipated D/C  setting: Home 11. Anticipated post D/C treatments: HH therapy and Home excercise program 12. Overall Rehab/Functional Prognosis: good  RECOMMENDATIONS: This patient's condition is appropriate for continued rehabilitative care in the following setting: Pt and family to discuss if pt will have 24/7 support at discharge. At present he does not and it is not likely pt will be able to achive a Mod I level of functioning after a short IRF. It family is able to provide support would recommend CIR after acute medical workup completed.   Patient has agreed to participate in recommended program. Potentially Note that insurance prior authorization may be required for reimbursement for recommended care.  Comment: Rehab Admissions Coordinator to follow up.  Maryla Morrow, MD 04/28/2015       Revision History     Date/Time User Provider Type Action   04/28/2015 4:25 PM Ankit Karis Juba, MD Physician Sign   04/28/2015 3:23 PM Jacquelynn Cree, PA-C Physician Assistant Pend   View Details Report       Routing History     Date/Time From To Method   04/28/2015 4:25 PM Ankit Karis Juba, MD Ankit Karis Juba, MD In [facility]

## 2015-05-02 NOTE — Interval H&P Note (Signed)
Gary Barker was admitted today to Inpatient Rehabilitation with the diagnosis of Left MCA infarct with aphasia and gait disorder.  The patient's history has been reviewed, patient examined, and there is no change in status.  Patient continues to be appropriate for intensive inpatient rehabilitation.  I have reviewed the patient's chart and labs.  Questions were answered to the patient's satisfaction. The PAPE has been reviewed and assessment remains appropriate.  Claudette Laws E 05/02/2015, 10:13 AM

## 2015-05-02 NOTE — Progress Notes (Signed)
Patient information reviewed and entered into eRehab system by Medea Deines, RN, CRRN, PPS Coordinator.  Information including medical coding and functional independence measure will be reviewed and updated through discharge.    

## 2015-05-02 NOTE — Progress Notes (Signed)
Retta Diones, RN Rehab Admission Coordinator Signed Physical Medicine and Rehabilitation PMR Pre-admission 04/30/2015 4:05 PM  Related encounter: ED to Hosp-Admission (Discharged) from 04/26/2015 in [facility] Collapse All   PMR Admission Coordinator Pre-Admission Assessment  Patient: Gary Barker is an 50 y.o., male MRN: [id number] DOB: 09/15/1965 Height: '5\' 9"'  (175.3 cm) Weight: 61 kg (134 lb 7.7 oz)  Insurance Information No insurance - self pay  Medicaid Application Date: Case Manager:  Disability Application Date: Case Worker:   Emergency Facilities manager Information    Name Relation Home Work Mobile   [place] Spouse   [phone]   Simone Curia Daughter   [phone]     Current Medical History  Patient Admitting Diagnosis: L MCA non-hemorrhagic infarct    History of Present Illness: A 50 y.o. male with history of tobacco use, recent gout flare affecting bilateral thumbs and heavy alcohol use who was admitted on 04/26/15 am with right sided weakness and confusion. He was last seen normal the night before. CTA head/neck limited but excluded large vessel occlusion. MRI/MRA brain done revealing extensive areas of non-hemorrhagic infarct throughout much of L-MCA territory sparing basal ganglia, focal high grade stenosis or filling defect in L-MCA distal M1 segment and approximately 50% mid basilar stenosis. 2D echo with EF 55-60% with no wall abnormality and grade 1 diastolic dysfunction. TEE with EF 55-60% and no wall abnormality. No thrombus or PFO seen and loop recorder placed by Dr. Caryl Comes on [facility] evaluation without signs of aspiration and patient placed on dysphagia 3, thin liquids. Cognitive evaluation shows mixed transcortical  aphasia impacting expressive and receptive language with 25% accuracy for basic Y/N questions and ability to follow commands. He continues to have significant cognitive deficits impacting balance, gait and ability to carry out ADL tasks. CIR recommend for follow up therapy.    Total: 5=NIH  Past Medical History  Past Medical History  Diagnosis Date  . Gout attack 04/2015  . Hyperlipidemia     Family History  family history includes Diabetes in his brother; Fibromyalgia in his mother; Hepatitis C in his mother; Seizures in his mother.  Prior Rehab/Hospitalizations: No previous rehab. Has been healthy up until this admission for CVA.  Has the patient had major surgery during 100 days prior to admission? No  Current Medications   Current facility-administered medications:  . 0.9 % sodium chloride infusion, , Intravenous, Continuous, Olam Idler, MD, Last Rate: 50 mL/hr at 04/30/15 2332 . acetaminophen (TYLENOL) tablet 650 mg, 650 mg, Oral, Q6H PRN, Carlyle Dolly, MD, 650 mg at 04/30/15 1113 . aspirin tablet 325 mg, 325 mg, Oral, Daily, Dickie La, MD, 325 mg at 05/01/15 0926 . atorvastatin (LIPITOR) tablet 40 mg, 40 mg, Oral, q1800, Dickie La, MD, 40 mg at 04/30/15 1743 . enoxaparin (LOVENOX) injection 40 mg, 40 mg, Subcutaneous, Q24H, Dickie La, MD, 40 mg at 04/30/15 2331 . ipratropium-albuterol (DUONEB) 0.5-2.5 (3) MG/3ML nebulizer solution 3 mL, 3 mL, Inhalation, Q6H PRN, Dickie La, MD . LORazepam (ATIVAN) injection 2-3 mg, 2-3 mg, Intravenous, Q1H PRN, Dickie La, MD . multivitamin (PROSIGHT) tablet 1 tablet, 1 tablet, Oral, Daily, Dickie La, MD, 1 tablet at 05/01/15 0604 . nicotine (NICODERM CQ - dosed in mg/24 hours) patch 14 mg, 14 mg, Transdermal, Daily, Dickie La, MD, 14 mg at 05/01/15 0927 . senna-docusate (Senokot-S) tablet 1 tablet, 1 tablet, Oral, QHS PRN, Dickie La, MD .  sodium chloride 0.9 % injection 3 mL, 3 mL, Intravenous,  Q12H, Veatrice Bourbon, MD, 3 mL at 04/30/15 0903 . [DISCONTINUED] thiamine (B-1) injection 100 mg, 100 mg, Intravenous, Daily **OR** thiamine (VITAMIN B-1) tablet 100 mg, 100 mg, Oral, Daily, Dickie La, MD, 100 mg at 05/01/15 0926 . traMADol (ULTRAM) tablet 50 mg, 50 mg, Oral, 4 times per day, Lupita Dawn, MD, 50 mg at 05/01/15 0603  Patients Current Diet: Diet regular Room service appropriate?: Yes; Fluid consistency:: Thin  Precautions / Restrictions Precautions Precautions: Fall Restrictions Weight Bearing Restrictions: No   Has the patient had 2 or more falls or a fall with injury in the past year?No  Prior Activity Level Community (5-7x/wk): Went out daily. worked FT days in the [employer]. Was driving.  Home Assistive Devices / Equipment Home Assistive Devices/Equipment: Eyeglasses Home Equipment: None  Prior Device Use: Indicate devices/aids used by the patient prior to current illness, exacerbation or injury? None.  Prior Functional Level Prior Function Level of Independence: Independent Comments: Pt works daytime at [employer]. Had recently been off due to gout flareup. Family worried about money. Sister in from [place] and she is asking about getting financial help for this family. Asked CM for SW consult.   Self Care: Did the patient need help bathing, dressing, using the toilet or eating? Independent  Indoor Mobility: Did the patient need assistance with walking from room to room (with or without device)? Independent  Stairs: Did the patient need assistance with internal or external stairs (with or without device)? Independent  Functional Cognition: Did the patient need help planning regular tasks such as shopping or remembering to take medications? Independent  Current Functional Level Cognition  Arousal/Alertness: Awake/alert Overall Cognitive Status: Impaired/Different from baseline Difficult to assess due to: (significant expressive and receptive  aphasia) Orientation Level: Oriented to person, Oriented to situation, Oriented to place Following Commands: Follows multi-step commands inconsistently General Comments: pt provided very quick fire commands with decr speed in response. tp with an echolia communication while performing balance assessment. pt repeating "turn Left turn Left" but without any attempts to walk L and no longer the focus of our session Attention: Selective Selective Attention: Impaired Selective Attention Impairment: Verbal basic, Functional basic Safety/Judgment: Impaired   Extremity Assessment (includes Sensation/Coordination)  Upper Extremity Assessment: Generalized weakness  Lower Extremity Assessment: Generalized weakness RLE Deficits / Details: 3/5 grossly RLE Coordination: decreased fine motor, decreased gross motor    ADLs  Overall ADL's : Needs assistance/impaired Lower Body Dressing: Moderate assistance Lower Body Dressing Details (indicate cue type and reason): pt abel to doff shoes crossing bil LE but required family (A) to don / lace / tie Toilet Transfer: Min guard Functional mobility during ADLs: Minimal assistance General ADL Comments: pt with balance assessment below. pt with incr fall risk with narrowed base of support, task that require visual scanning with ambulation, cogntiive challenges with ambulation required and tantum standing. pt with lob with vision occlusion. So patient is fall risk going from dark room to a room full of light at night, fall risk for stepping into a tub or on a curb, fall risk with narrowed spaces between furniture in living room or in front of a toilet, fall risk if speed changes like urgency to void occurs.     Mobility  Overal bed mobility: Needs Assistance Bed Mobility: Supine to Sit Supine to sit: Supervision General bed mobility comments: at EOB on arrival with family    Transfers  Overall  transfer level: Needs assistance Equipment used:  None Transfers: Sit to/from Stand Sit to Stand: Min guard General transfer comment: needs incr time to static stand . pt reaching out with bil UE for balance    Ambulation / Gait / Stairs / Wheelchair Mobility  Ambulation/Gait Ambulation/Gait assistance: Min assist, Mod assist Ambulation Distance (Feet): 400 Feet Assistive device: None Gait Pattern/deviations: Step-through pattern, Decreased step length - right, Decreased dorsiflexion - right, Decreased stance time - right, Decreased weight shift to right, Ataxic, Staggering left, Staggering right General Gait Details: Pt able to ambulate but needing min assist to mod assist at times as he has right knee instability and decr safety as he is unaware of deficits decreasing his safety with mobility tasks. Pt at high risk of falls due to weakness right hemibody and cognitive issues affecting safety. Pt unable to express his thoughts due to aphasia as well. Pt unable to name his mother, brother or sister. Frustrated at times due to this. Noted right LE fatigue as pt continued gait. Gait velocity interpretation: <1.8 ft/sec, indicative of risk for recurrent falls    Posture / Balance Balance Overall balance assessment: Needs assistance Sitting-balance support: No upper extremity supported, Feet supported Sitting balance-Leahy Scale: Fair Standing balance support: No upper extremity supported, During functional activity Standing balance-Leahy Scale: Poor Standing balance comment: Still needing steadying for static support.  High level balance activites: Direction changes, Turns, Sudden stops High Level Balance Comments: Needed mod assist for stability with gait when challenged. Standardized Balance Assessment Standardized Balance Assessment : Berg Balance Test, Dynamic Gait Index Berg Balance Test Sit to Stand: Able to stand independently using hands Standing Unsupported: Able to stand 2 minutes with supervision Stand to Sit: Controls  descent by using hands Transfers: Able to transfer safely, definite need of hands Standing Unsupported with Eyes Closed: Able to stand 10 seconds with supervision Standing Ubsupported with Feet Together: Able to place feet together independently but unable to hold for 30 seconds Turn 360 Degrees: Able to turn 360 degrees safely one side only in 4 seconds or less Standing Unsupported, Alternately Place Feet on Step/Stool: Able to stand independently and complete 8 steps >20 seconds Standing Unsupported, One Foot in Front: Able to take small step independently and hold 30 seconds Standing on One Leg: Able to lift leg independently and hold equal to or more than 3 seconds Dynamic Gait Index Level Surface: Mild Impairment Change in Gait Speed: Moderate Impairment Gait with Horizontal Head Turns: Moderate Impairment Gait with Vertical Head Turns: Moderate Impairment Gait and Pivot Turn: Moderate Impairment Step Over Obstacle: Moderate Impairment Step Around Obstacles: Moderate Impairment Steps: Mild Impairment Total Score: 10    Special needs/care consideration BiPAP/CPAP No CPM No Continuous Drip IV 0.9% NS 50 ml/hr Dialysis No  Life Vest No Oxygen NO Special Bed No Trach Size No Wound Vac (area) No  Skin No  Bowel mgmt: Last BM 04/27/15 Bladder mgmt: Voiding WDL Diabetic mgmt: Hgb A1C is elevated to 6.5, no history of diabetes    Previous Home Environment Living Arrangements: Spouse/significant other, Children Lives With: Family Available Help at Discharge: Family, Available PRN/intermittently Type of Home: House Home Layout: Two level, Bed/bath upstairs Alternate Level Stairs-Rails: Right Alternate Level Stairs-Number of Steps: 11 Home Access: Stairs to enter Entrance Stairs-Rails: None Entrance Stairs-Number of Steps: 4 Bathroom Shower/Tub: Optometrist: Yes Home Care  Services: No  Discharge Living Setting Plans for Discharge Living Setting: Patient's home, House, Lives with (  comment) (Lives with wife, 35 and 20 yo children.) Type of Home at Discharge: House Discharge Home Layout: Two level, 1/2 bath on main level, Bed/bath upstairs Alternate Level Stairs-Number of Steps: 11 steps to upstairs with bedroom/bathroom Discharge Home Access: Stairs to enter Entrance Stairs-Rails: None Entrance Stairs-Number of Steps: 2 step entry with no rails Does the patient have any problems obtaining your medications?: No  Social/Family/Support Systems Patient Roles: Spouse, Parent, Other (Comment) (Has a wife, 24 and 100 yo children, mom, siblings.) Contact Information: Chancy Hurter - wife Anticipated Caregiver: wife and family Anticipated Caregiver's Contact Information: Elon Jester - wife [phone] Ability/Limitations of Caregiver: Wife works a temporary FT position nights 11:30 pm to 8 am. She and patient worked opposite shifts Caregiver Availability: Other (Comment) (Family working on how to have someone stay with patient 24/7) Discharge Plan Discussed with Primary Caregiver: Yes Is Caregiver In Agreement with Plan?: Yes Does Caregiver/Family have Issues with Lodging/Transportation while Pt is in Rehab?: No (However family from [place] and [place].)  Goals/Additional Needs Patient/Family Goal for Rehab: PT/OT mod I and supervision goals, ST supervision and min assist goals Expected length of stay: 19-22 days (Note: I anticipate a shorter LOS since patient is progressing well. May only take 7 days inpatient rehab stay) Cultural Considerations: Speaks [place] and [place]. Wife speaks Spanish. Family from [place]. Raised catholic but currently attending [employer]. Dietary Needs: Regular diet, thin liquids Equipment Needs: TBD Special Service Needs: Family concerned about finances and are asking for any help for family. Pt/Family Agrees to  Admission and willing to participate: Yes (Met with sister, brother, daughter and wife.) Program Orientation Provided & Reviewed with Pt/Caregiver Including Roles & Responsibilities: Yes  Decrease burden of Care through IP rehab admission: N/A  Possible need for SNF placement upon discharge: Not planned, family plan to stay with patient as needed after discharge.  Patient Condition: This patient's medical and functional status has changed since the consult dated: 04/28/15 in which the Rehabilitation Physician determined and documented that the patient's condition is appropriate for intensive rehabilitative care in an inpatient rehabilitation facility. See "History of Present Illness" (above) for medical update. Functional changes are: Currently requiring min/mod assist to ambulate 400 feet with no device. Patient's medical and functional status update has been discussed with the Rehabilitation physician and patient remains appropriate for inpatient rehabilitation. Will admit to inpatient rehab today.  Preadmission Screen Completed By: Retta Diones, 05/01/2015 10:34 AM ______________________________________________________________________  Discussed status with Dr. Letta Pate on 05/01/15 at 1034 and received telephone approval for admission today.  Admission Coordinator: Retta Diones, [id number]/Date01/19/17          Cosigned by: Charlett Blake, MD at 05/01/2015 11:04 AM  Revision History     Date/Time User Provider Type Action   05/01/2015 11:04 AM Charlett Blake, MD Physician Cosign   05/01/2015 10:34 AM Retta Diones, RN Rehab Admission Coordinator Sign

## 2015-05-02 NOTE — Progress Notes (Signed)
Social Work Assessment and Plan  Patient Details  Name: Gary Barker MRN: [id number] Date of Birth: Jul 13, 1965  Today's Date: 05/02/2015  Problem List:  Patient Active Problem List   Diagnosis Date Noted  . Acute ischemic left MCA stroke ([place]) 05/01/2015  . Gait disturbance, post-stroke   . Gout, chronic   . Dysphagia as late effect of cerebrovascular disease   . Aphasia as late effect of cerebrovascular accident   . Altered mental status   . Alcoholism ([place])   . Essential hypertension   . Tobacco abuse   . CVA (cerebral infarction) 04/26/2015  . Stroke (cerebrum) ([place]) 04/26/2015  . Cerebral infarction due to unspecified mechanism   . Alcohol use disorder ([place])   . Tobacco use disorder    Past Medical History:  Past Medical History  Diagnosis Date  . Gout attack 04/2015  . Hyperlipidemia    Past Surgical History:  Past Surgical History  Procedure Laterality Date  . Tee without cardioversion N/A 04/29/2015    Procedure: TRANSESOPHAGEAL ECHOCARDIOGRAM (TEE);  Surgeon: Jerline Pain, MD;  Location: [facility];  Service: Cardiovascular;  Laterality: N/A;  . Ep implantable device N/A 04/30/2015    Procedure: Loop Recorder Insertion;  Surgeon: Deboraha Sprang, MD;  Location: [facility];  Service: Cardiovascular;  Laterality: N/A;   Social History:  reports that he has been smoking Cigarettes.  He has a 15 pack-year smoking history. He has never used smokeless tobacco. He reports that he drinks alcohol. He reports that he does not use illicit drugs.  Family / Support Systems Marital Status: Married Patient Roles: Spouse, Parent, Other (Comment) (brother) Spouse/Significant Other: Gary Barker - wife - [phone] Children: Gary Barker - dtr - [phone] (52 y.o. lives in [place]); pt also has 77 and 52 year old step-dtrs Other Supports: Gary Barker - sister - [phone] (also lives in [place]) Anticipated Caregiver: wife and family Ability/Limitations of Caregiver: Wife  works a temporary FT position nights 11:30 pm to 8 am.  She and patient worked opposite shifts.  Wife plans to stay home with pt for a while. Caregiver Availability: 24/7 (at this time, wife is planning to stay home with pt.  Family to help financially and they would like any community resources to assist.) Family Dynamics: close, supportive family.  OF NOTE, PT'S BROTHER Gary Barker MAY VISIT, BUT HE IS TO MAKE NO MEDICAL DECISIONS AND IF HE UPSETS PT OR IS INAPPROPRIATE, FAMILY WOULD LIKE SECURITY TO BE CALLED.  Social History Preferred language: English Religion: Pentecostal Cultural Background: Pt moved to [place] in 1999 from [place].  Wife is from [place] and was detained for 8 months due to an error in her paperwork. 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 Legal History/Current Legal Issues: none reported Guardian/Conservator: no guardian currently, but family is making pt's medical decisions.   Abuse/Neglect Physical Abuse: Denies Verbal Abuse: Denies Sexual Abuse: Denies Exploitation of patient/patient's resources: Denies Self-Neglect: Denies  Emotional Status Pt's affect, behavior and adjustment status: Pt is unable to communicate at this time.  He has been emotional at times after the stroke. Recent Psychosocial Issues: Pt has been laid off several times in recent years, but has always found work.  Wife is working a temporary job, but will need to be home with pt. Psychiatric History: none reported Substance Abuse History: alcohol -  all hx obtained from chart and family due to pt not being able to communicate about this.  Would drink beer in excess.  Patient / Family Perceptions, Expectations & Goals Pt/Family understanding of illness & functional limitations: Pt's family seems to have a good understanding of pt's condition.  They  realize why pt will need 24/7 supervision. 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 Anticipated changes in roles/activities/participation: Pt will not be able to work for until his cognitive impairments improve. Pt/family expectations/goals: Family wants for pt to have a meaningful life and enjoy his family.  Community Resources [employer] Scripts: None Premorbid Home Care/DME Agencies: None Transportation available at discharge: wife  Resource referrals recommended: Neuropsychology, Support group (specify) (when pt can communicate better)  Discharge Planning Living Arrangements: Spouse/significant other, Children Support Systems: Spouse/significant other, Children, Other relatives Type of Residence: Private residence Insurance Resources: Teacher, adult education Resources: Employment, Secondary school teacher Screen Referred: Yes (Development worker, community to apply pt/family for DSS services and disability) Living Expenses: Rent Money Management: Patient Does the patient have any problems obtaining your medications?: Yes (Describe) (no insurance.  very llittle financial resources.) Home Management: Pt and wife did this together.  wife can manage. Patient/Family Preliminary Plans: Pt to return home with his wife taking a leave from work.   Barriers to Discharge: Steps, Finances, Self care, Family Support Social Work Anticipated Follow Up Needs: HH/OP, Support Group Expected length of stay: 7 days?  Clinical Impression CSW met with pt and his family - wife, dtr and sister to introduce self and role of CSW, as well as to complete assessment.  Sister and dtr live in [place] and are returning home this weekend.  They are really worried about pt and his family here.  Wife works at night and she has two dtrs in their home.  Pt helped with the children at night while wife was at work.  Wife is considering taking time off from work to be with pt, as he  will need 24/7 supervision at home.  Extended family plans to try to help with visits and finances, as they are able.  Pt will need community resources for assistance with medical bills, primary care, and follow up therapies, as he does not have any insurance.  Financial counselor, Jasmin, came to see pt/family while CSW while visiting and she will help him apply for Medicaid and other DSS services and social security disability.  CSW explained to family that these services can take a while to get into place.  CSW will also set pt up with [facility].  Pt has a brother who lives locally, [place] "Ron[place]e [place]" [place], but he should not be in charge of making any medical decisions.  Pt was pleasant with CSW, but really couldn't communicate with CSW.  Wife was able to understand CSW, but for teaching and detailed instructions, it would be best to have a spanish interpreter and write them down.  Wife is not used to managing things for husband, as he has been independent and took care of all of the family's affairs.  CSW will continue to work with family and will continue to follow pt.  PrevattSilvestre Mesi 05/02/2015, 9:42 PM

## 2015-05-02 NOTE — Evaluation (Signed)
Occupational Therapy Assessment and Plan  Patient Details  Name: Gary Barker MRN: [id number] Date of Birth: August 09, 1965  OT Diagnosis: acute pain, apraxia, cognitive deficits and hemiplegia affecting dominant side Rehab Potential: Rehab Potential (ACUTE ONLY): Good ELOS: 7-10 days   Today's Date: 05/02/2015 OT Individual Time: 8366-2947 OT Individual Time Calculation (min): 65 min     Problem List:  Patient Active Problem List   Diagnosis Date Noted  . Acute ischemic left MCA stroke ([place]) 05/01/2015  . Gait disturbance, post-stroke   . Gout, chronic   . Dysphagia as late effect of cerebrovascular disease   . Aphasia as late effect of cerebrovascular accident   . Altered mental status   . Alcoholism ([place])   . Essential hypertension   . Tobacco abuse   . CVA (cerebral infarction) 04/26/2015  . Stroke (cerebrum) ([place]) 04/26/2015  . Cerebral infarction due to unspecified mechanism   . Alcohol use disorder ([place])   . Tobacco use disorder     Past Medical History:  Past Medical History  Diagnosis Date  . Gout attack 04/2015  . Hyperlipidemia    Past Surgical History:  Past Surgical History  Procedure Laterality Date  . Tee without cardioversion N/A 04/29/2015    Procedure: TRANSESOPHAGEAL ECHOCARDIOGRAM (TEE);  Surgeon: Jerline Pain, MD;  Location: [facility];  Service: Cardiovascular;  Laterality: N/A;  . Ep implantable device N/A 04/30/2015    Procedure: Loop Recorder Insertion;  Surgeon: Deboraha Sprang, MD;  Location: [facility];  Service: Cardiovascular;  Laterality: N/A;    Assessment & Plan Clinical Impression: Patient is a 50 y.o. male with history of tobacco use, recent gout flare affecting bilateral thumbs and heavy alcohol use who was admitted on 04/26/15 am with right sided weakness and confusion. He was last seen normal the night before. CTA head/neck limited but excluded large vessel occlusion. MRI/MRA brain done revealing extensive areas of  non-hemorrhagic infarct throughout much of L-MCA territory sparing basal ganglia, focal high grade stenosis or filling defect in L-MCA distal M1 segment and approximately 50% mid basilar stenosis. 2D echo with EF 55-60% with no wall abnormality and grade 1 diastolic dysfunction. TEE with EF 55-60% and no wall abnormality. No thrombus or PFO seen and loop recorder placed by Dr. Caryl Comes on [facility] evaluation without signs of aspiration and patient placed on dysphagia 3, thin liquids. Cognitive evaluation shows mixed transcortical aphasia impacting expressive and receptive language with 25% accuracy for basic Y/N questions and ability to follow commands. He continues to have significant cognitive deficits impacting balance, gait and ability to carry out ADL tasks. CIR recommend for follow up therapy.   Patient transferred to CIR on 05/01/2015 .    Patient currently requires min with basic self-care skills secondary to motor apraxia and decreased motor planning, decreased initiation, decreased attention, decreased awareness, decreased problem solving, decreased safety awareness and decreased memory and decreased standing balance.  Prior to hospitalization, patient could complete ADLs and IADLs with independent .  Patient will benefit from skilled intervention to increase independence with basic self-care skills prior to discharge home with care partner.  Anticipate patient will require 24 hour supervision and follow up TBD.  OT - End of Session Activity Tolerance: Tolerates 30+ min activity with multiple rests Endurance Deficit: Yes Endurance Deficit Description: required multiple rest breaks OT Assessment Rehab Potential (ACUTE ONLY): Good Barriers to Discharge: Decreased caregiver support OT Patient demonstrates impairments in the following area(s): Balance;Cognition;Endurance;Motor;Pain;Perception;Safety OT Basic ADL's Functional Problem(s): Eating;Grooming;Bathing;Dressing;Toileting OT  Advanced ADL's  Functional Problem(s): Simple Meal Preparation OT Transfers Functional Problem(s): Toilet;Tub/Shower OT Additional Impairment(s): Fuctional Use of Upper Extremity OT Plan OT Intensity: Minimum of 1-2 x/day, 45 to 90 minutes OT Frequency: 5 out of 7 days OT Duration/Estimated Length of Stay: 7-10 days OT Treatment/Interventions: Balance/vestibular training;Cognitive remediation/compensation;Discharge planning;Disease mangement/prevention;DME/adaptive equipment instruction;Community reintegration;Functional mobility training;Neuromuscular re-education;Pain management;Patient/family education;Psychosocial support;Self Care/advanced ADL retraining;Therapeutic Activities;Therapeutic Exercise;UE/LE Strength taining/ROM;UE/LE Coordination activities OT Self Feeding Anticipated Outcome(s): Mod I OT Basic Self-Care Anticipated Outcome(s): Mod I OT Toileting Anticipated Outcome(s): Mod I OT Bathroom Transfers Anticipated Outcome(s): Mod I OT Recommendation Patient destination: Home Follow Up Recommendations: 24 hour supervision/assistance;Home health OT (TBD) Equipment Recommended: To be determined   Skilled Therapeutic Intervention OT eval completed with discussion of rehab process, OT purpose, POC, goals, and ELOS.  ADL assessment completed in room shower with pt resistant to having therapist present for bathing.  Re-educated on OT purpose and need to assess pt's current level of function.  Pt maintained standing balance in single leg stance when undressing and during bathing with close supervision, but no LOB.  Educated on fall risk and recommendation to sit for dressing and undressing to decrease fall risk and burden of care.  Pt required max encouragement to participate in eval, utilizing verbal and demonstration cues to complete tasks with pt still requiring increased time and demonstration.  Pt with difficulty following directions with novel tasks and would "shut down" when a task was challenging.   Family present and able to provide information about home setup and PLOF as pt's yes/no not accurate.    OT Evaluation Precautions/Restrictions  Precautions Precautions: Fall Pain Pain Assessment Pain Assessment: Faces Faces Pain Scale: Hurts even more Pain Type: Acute pain Pain Location: Head Pain Descriptors / Indicators: Headache Pain Intervention(s): RN made aware Home Living/Prior [place] expects to be discharged to:: Private residence Living Arrangements: Spouse/significant other, Children Available Help at Discharge: Family, Available PRN/intermittently Type of Home: House Home Access: Stairs to enter Technical brewer of Steps: 4 Entrance Stairs-Rails: None Home Layout: Two level, Bed/bath upstairs Alternate Level Stairs-Number of Steps: 11 Alternate Level Stairs-Rails: Right Bathroom Shower/Tub: Multimedia programmer: Programmer, systems: Yes  Lives With: Family IADL History Homemaking Responsibilities: Yes Meal Prep Responsibility: Primary Laundry Responsibility: Primary Cleaning Responsibility: Primary Bill Paying/Finance Responsibility: Primary Shopping Responsibility: Primary Current License: Yes Mode of Transportation: Car Occupation: Full time employment Prior Function Level of Independence: Independent with basic ADLs, Independent with homemaking with ambulation, Independent with gait, Independent with transfers  Able to Take Stairs?: Yes Driving: Yes Vocation: Full time employment Comments: enjoys spending time with family, enjoys basketball - the  [place] ADL  See Function Navigator Vision/Perception  Vision- History Baseline Vision/History: Wears glasses Wears Glasses: At all times Patient Visual Report: No change from baseline Vision- Assessment Vision Assessment?: Vision impaired- to be further tested in functional context Additional Comments: Difficult to assess secondary to aphasia and  impaired ability to follow directions  Cognition Overall Cognitive Status: Impaired/Different from baseline Arousal/Alertness: Awake/alert Attention: Selective Selective Attention: Impaired Awareness: Impaired Awareness Impairment: Intellectual impairment Safety/Judgment: Impaired Comments: Unable to complete BIMS secondary to aphasia Sensation Sensation Light Touch: Appears Intact Stereognosis: Not tested Hot/Cold: Not tested Proprioception:  (Unable to formally assess due to aphasia and decreased ability to follow one step directions) Coordination Fine Motor Movements are Fluid and Coordinated: No Finger Nose Finger Test: difficult to assess secondary to aphasia and decreased ability to follow one step directions 9 Hole Peg  Test: Lt : 30 seconds, Rt: 32 seconds Extremity/Trunk Assessment RUE Assessment RUE Assessment: Within Functional Limits LUE Assessment LUE Assessment: Within Functional Limits   See Function Navigator for Current Functional Status.   Refer to Care Plan for Long Term Goals  Recommendations for other services: None  Discharge Criteria: Patient will be discharged from OT if patient refuses treatment 3 consecutive times without medical reason, if treatment goals not met, if there is a change in medical status, if patient makes no progress towards goals or if patient is discharged from hospital.  The above assessment, treatment plan, treatment alternatives and goals were discussed and mutually agreed upon: by patient and by family  [place] 05/02/2015, 12:31 PM

## 2015-05-03 ENCOUNTER — Encounter (HOSPITAL_COMMUNITY): Payer: Self-pay | Admitting: Speech Pathology

## 2015-05-03 ENCOUNTER — Inpatient Hospital Stay (HOSPITAL_COMMUNITY): Payer: Medicaid Other | Admitting: Occupational Therapy

## 2015-05-03 ENCOUNTER — Inpatient Hospital Stay (HOSPITAL_COMMUNITY): Payer: Medicaid Other | Admitting: Physical Therapy

## 2015-05-03 LAB — CREATININE, SERUM
CREATININE: 0.67 mg/dL (ref 0.61–1.24)
GFR calc Af Amer: >60 mL/min (ref >60)
GFR calc non Af Amer: >60 mL/min (ref >60)

## 2015-05-03 MED ORDER — ENOXAPARIN SODIUM 40 MG/0.4ML ~~LOC~~ SOLN
40.0000 mg | SUBCUTANEOUS | Status: DC
  Administered 2015-05-03 – 2015-05-07 (×5): 40 mg via SUBCUTANEOUS
  Filled 2015-05-03 (×5): qty 0.4

## 2015-05-03 NOTE — Progress Notes (Signed)
50 y.o. male with history of tobacco use, recent gout flare affecting bilateral thumbs and heavy alcohol use who was admitted on 04/26/15 am with right sided weakness and confusion. He was last seen normal the night before. CTA head/neck limited but excluded large vessel occlusion. MRI/MRA brain done revealing extensive areas of non-hemorrhagic infarct throughout much of L-MCA territory sparing basal ganglia, focal high grade stenosis or filling defect in L-MCA distal M1 segment and approximately 50% mid basilar stenosis. 2D echo with EF 55-60% with no wall abnormality and grade 1 diastolic dysfunction. TEE with EF 55-60% and no wall abnormality. No thrombus or PFO seen and loop recorder placed by Dr. Caryl Comes on [facility] evaluation without signs of aspiration and patient placed on dysphagia 3, thin liquids  Subjective/Complaints: Mild headache. Otherwise doing ok. Family in room. ROS- unable aphasia Objective: Vital Signs: Blood pressure 117/63, pulse 85, temperature 97.8 F (36.6 C), temperature source Oral, resp. rate 18, height 5' 7" (1.702 m), weight 61.644 kg (135 lb 14.4 oz), SpO2 96 %. No results found. Results for orders placed or performed during the hospital encounter of 05/01/15 (from the past 72 hour(s))  CBC WITH DIFFERENTIAL     Status: None   Collection Time: 05/02/15  6:00 AM  Result Value Ref Range   WBC 7.7 4.0 - 10.5 K/uL   RBC 4.68 4.22 - 5.81 MIL/uL   Hemoglobin 15.3 13.0 - 17.0 g/dL   HCT 43.9 39.0 - 52.0 %   MCV 93.8 78.0 - 100.0 fL   MCH 32.7 26.0 - 34.0 pg   MCHC 34.9 30.0 - 36.0 g/dL   RDW 11.7 11.5 - 15.5 %   Platelets 194 150 - 400 K/uL   Neutrophils Relative % 67 %   Neutro Abs 5.1 1.7 - 7.7 K/uL   Lymphocytes Relative 18 %   Lymphs Abs 1.4 0.7 - 4.0 K/uL   Monocytes Relative 13 %   Monocytes Absolute 1.0 0.1 - 1.0 K/uL   Eosinophils Relative 2 %   Eosinophils Absolute 0.2 0.0 - 0.7 K/uL   Basophils Relative 0 %   Basophils Absolute 0.0 0.0 - 0.1 K/uL   Comprehensive metabolic panel     Status: Abnormal   Collection Time: 05/02/15  6:00 AM  Result Value Ref Range   Sodium 136 135 - 145 mmol/L   Potassium 3.9 3.5 - 5.1 mmol/L   Chloride 105 101 - 111 mmol/L   CO2 25 22 - 32 mmol/L   Glucose, Bld 111 (H) 65 - 99 mg/dL   BUN 8 6 - 20 mg/dL   Creatinine, Ser 0.66 0.61 - 1.24 mg/dL   Calcium 9.1 8.9 - 10.3 mg/dL   Total Protein 6.5 6.5 - 8.1 g/dL   Albumin 3.4 (L) 3.5 - 5.0 g/dL   AST 22 15 - 41 U/L   ALT 20 17 - 63 U/L   Alkaline Phosphatase 63 38 - 126 U/L   Total Bilirubin 0.6 0.3 - 1.2 mg/dL   GFR calc non Af Amer >60 >60 mL/min   GFR calc Af Amer >60 >60 mL/min    Comment: (NOTE) The eGFR has been calculated using the CKD EPI equation. This calculation has not been validated in all clinical situations. eGFR's persistently <60 mL/min signify possible Chronic Kidney Disease.    Anion gap 6 5 - 15  Creatinine, serum     Status: None   Collection Time: 05/03/15  5:05 AM  Result Value Ref Range   Creatinine, Ser 0.67  0.61 - 1.24 mg/dL   GFR calc non Af Amer >60 >60 mL/min   GFR calc Af Amer >60 >60 mL/min    Comment: (NOTE) The eGFR has been calculated using the CKD EPI equation. This calculation has not been validated in all clinical situations. eGFR's persistently <60 mL/min signify possible Chronic Kidney Disease.      HEENT: normal Cardio: RRR and no murmurs Resp: CTA B/L and Unlabored GI: BS positive and not tender Extremity:  No Edema Skin:   Intact Neuro: Alert/Oriented and Aphasic expressive---able to express thoughts in words/small phrases Musc/Skel:  Normal Gen NAD   Assessment/Plan: 1. 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 Physiatrist is providing close team supervision and 24  hour management of active medical problems listed below. Physiatrist and rehab team continue to assess barriers to discharge/monitor patient progress toward functional and medical goals. FIM: Function - Bathing Position: Shower Body parts bathed by patient: Right arm, Left arm, Chest, Abdomen, Front perineal area, Buttocks, Right upper leg, Left upper leg, Right lower leg, Left lower leg Assist Level: Supervision or verbal cues, Set up Set up : To obtain items, To adjust water temperature  Function- Upper Body Dressing/Undressing What is the patient wearing?: Button up shirt Button up shirt - Perfomed by patient: Thread/unthread right sleeve, Thread/unthread left sleeve, Pull shirt around back, Button/unbutton shirt Assist Level: Supervision or verbal cues Function - Lower Body Dressing/Undressing What is the patient wearing?: Underwear, Pants, Socks Position:  (sit > stand at toilet) Underwear - Performed by patient: Thread/unthread right underwear leg, Thread/unthread left underwear leg, Pull underwear up/down Pants- Performed by patient: Thread/unthread right pants leg, Thread/unthread left pants leg, Pull pants up/down Socks - Performed by patient: Don/doff right sock, Don/doff left sock Assist for footwear: Supervision/touching assist Assist for lower body dressing: Touching or steadying assistance (Pt > 75%)  Function - Toileting Toileting steps completed by patient: Adjust clothing after toileting, Adjust clothing prior to toileting, Performs perineal hygiene Assist level: Supervision or verbal cues  Function Midwife transfer assistive device: Grab bar Assist level to toilet: Supervision or verbal cues Assist level from toilet: Supervision or verbal cues  Function - Chair/bed transfer Chair/bed transfer method: Ambulatory Chair/bed transfer assist level: Touching or steadying assistance (Pt > 75%) Chair/bed transfer details: Visual cues/gestures for  precautions/safety  Function - Locomotion: Wheelchair Will patient use wheelchair at discharge?: No (pt ambulatory) Function - Locomotion: Ambulation Max distance: 200 Assist level: Supervision or verbal cues Assist level: Supervision or verbal cues Assist level: Supervision or verbal cues Assist level: Touching or steadying assistance (Pt > 75%) Assist level: Touching or steadying assistance (Pt > 75%)  Function - Comprehension Comprehension: Auditory Comprehension assist level: Understands basic 25 - 49% of the time/ requires cueing 50 - 75% of the time  Function - Expression Expression: Verbal Expression assist level: Expresses basic 25 - 49% of the time/requires cueing 50 - 75% of the time. Uses single words/gestures.  Function - Social Interaction Social Interaction assist level: Interacts appropriately 25 - 49% of time - Needs frequent redirection.  Function - Problem Solving Problem solving assist level: Solves basic less than 25% of the time - needs direction nearly all the time or does not effectively solve problems and may need a restraint for safety  Function - Memory Memory assist  level: Recognizes or recalls less than 25% of the time/requires cueing greater than 75% of the time Patient normally able to recall (first 3 days only): That he or she is in a hospital   Medical Problem List and Plan: 1.   Gait disorder with decreased balance, mixed transcortical aphasia or apraxia secondary to Left posterior MCA distribution infarct-   -continue CIR therapies 2.  DVT Prophylaxis/Anticoagulation: Pharmaceutical: Lovenox 3. Pain Management: Will continue tramadol qid, has headache relieved with tramadol. topamax effective 4. Mood: No signs of distress noted but patient with significant aphasia as well as lack of awareness of deficits. LCSW to follow along for evaluation and support as appropriate.   5. Neuropsych: This patient is not capable of making decisions on his own  behalf. 6. Skin/Wound Care: Routine pressure relief measures.   7. Fluids/Electrolytes/Nutrition: Monitor I/O. All lytes reviewed. Within normal limits   8. HTN: Monitor BP tid with permissive HTN to allow for adequate perfusion.  Lisinopril initiated with improvement. No changes today 9. Dyslipidemia: On lipitor 10. Constipation:  bowel program.    LOS (Days) 2 A FACE TO FACE EVALUATION WAS PERFORMED  SWARTZ,Gary Barker 05/03/2015, 8:16 AM

## 2015-05-03 NOTE — Plan of Care (Signed)
Problem: RH SAFETY Goal: RH STG ADHERE TO SAFETY PRECAUTIONS W/ASSISTANCE/DEVICE STG Adhere to Safety Precautions With min Assistance/Device.  Outcome: Not Progressing impulsive     

## 2015-05-03 NOTE — Progress Notes (Addendum)
Speech Language Pathology Daily Session Note  Patient Details  Name: Gary Barker MRN: [id number] Date of Birth: [birth year]  Today's Date: 05/03/2015 SLP Concurrent Time: 1300-1400 SLP Concurrent Time Calculation (min): 60 min   Short Term Goals: Week 1: SLP Short Term Goal 1 (Week 1): Pt will name basic, familiar objects for 75% accuracy with max assist verbal cues.  SLP Short Term Goal 2 (Week 1): Pt will follow 1 step commands for 75% accuracy with max assist verbal, visual, and tactile cues.  SLP Short Term Goal 3 (Week 1): Pt will sustain his attention to task for 5-7 minute intervals with mod assist verbal cues for redirection.  SLP Short Term Goal 4 (Week 1): Pt will use call bell in 50% of observable opportunities with max assist verbal and visual cues.   SLP Short Term Goal 5 (Week 1): Pt will return demonstration of at least 2 safety precautions with max assist verbal and visual cues.    Skilled Therapeutic Interventions: Skilled treatment session focused on addressing dysphagia and language goals. SLP facilitated session by providing se-up assist of lunch tray.  Patient insisted on doing it himself which resulted in him consuming regular textures via finger feeding.  He did utilize a fork for consumption of soup and with Supervision cues he was able to correct to use of a spoon. Patient consumed regular textures and thin liquids with no overt s/s of aspiration throughout meal and as a result it is recommended that he be set-up and then provided with intermittent supervision with PO.  SLP also facilitated session with Max assist semantic and phonemic cues to name items on tray.  Patient's spontaneous verbal expression was characterized by phonemic and semantic paraphasia with perseverative errors.  Patient required Max assist multimodal cues to recognize instances of perseveration.  RN notified of plan to assist with meals.     Function:  Eating Eating   Modified Consistency Diet:  No Eating Assist Level: Set up assist for;Supervision or verbal cues   Eating Set Up Assist For: Opening containers;Cutting food       Cognition Comprehension Comprehension assist level: Understands basic 25 - 49% of the time/ requires cueing 50 - 75% of the time  Expression   Expression assist level: Expresses basic 25 - 49% of the time/requires cueing 50 - 75% of the time. Uses single words/gestures.  Social Interaction Social Interaction assist level: Interacts appropriately 75 - 89% of the time - Needs redirection for appropriate language or to initiate interaction.  Problem Solving Problem solving assist level: Solves basic 25 - 49% of the time - needs direction more than half the time to initiate, plan or complete simple activities  Memory Memory assist level: Recognizes or recalls less than 25% of the time/requires cueing greater than 75% of the time    Pain Pain Assessment Pain Assessment: Faces Pain Score: 0-No pain Pain Intervention(s): Medication (See eMAR) (scheduled)  Therapy/Group: Individual Therapy  Charlane Ferretti., CCC-SLP [phone]  Gary Barker 05/03/2015, 3:41 PM

## 2015-05-03 NOTE — Progress Notes (Signed)
Occupational Therapy Session Note  Patient Details  Name: Gary Barker MRN: [id number] Date of Birth: 29-Mar-1966  Today's Date: 05/03/2015 OT Individual Time: 1010-1125 OT Individual Time Calculation (min): 75 min    Short Term Goals: Week 1:  OT Short Term Goal 1 (Week 1): STG = LTGs due to ELOS  Skilled Therapeutic Interventions/Progress Updates: patient and wife participated in education for standing shower and dressing.   Wife was reminded to stay to husband while in the wet shower and during ambulation as he c/o dizziness if he laterally rotated quickly.     As well, he participated in trunk stability and strengthening.   He and wife demonstrated Salsa dance as patient remembererd and sequenced the steps (He c/o of and exhibited short term memory deficits before this session started).     Therapy Documentation Precautions:  Precautions Precautions: Fall Restrictions Weight Bearing Restrictions: No Pain:denied   See Function Navigator for Current Functional Status.   Therapy/Group: Individual Therapy  Bud Face [facility] 05/03/2015, 2:32 PM

## 2015-05-03 NOTE — Progress Notes (Signed)
Physical Therapy Session Note  Patient Details  Name: Gary Barker MRN: [id number] Date of Birth: 1965/06/25  Today's Date: 05/03/2015 PT Individual Time: 0900-1000 PT Individual Time Calculation (min): 60 min   Short Term Goals: Week 1:  PT Short Term Goal 1 (Week 1): =LTG due to estimated length of stay  Skilled Therapeutic Interventions/Progress Updates:  Pt was seen bedside in the am with wife at bedside. Pt transferred supine to edge of bed with head of bed elevated, side rail and S. Pt transferred sit to stand with S. Pt ambulated about 150 feet without assistive device and min guard to S. Pt performed multiple sit to stand transfers with S and multiple stand pivot transfers with S to min guard. Treatment in gym focused on progressive ambulation training and NMR. Pt performed cone taps, alternating cone taps and criss cross cone taps, 3 sets x 10 reps each. Pt ambulated through slalom course 60 feet x 3, side stepped 60 feet x 3 and ambulated backwards 60 feet x 3. Following treatment pt ambulated back to room about 200 feet with S. Pt ambulated around room and into bathroom with S. Pt left sitting up on edge of bed with bed alarm on and wife at bedside.   Therapy Documentation Precautions:  Precautions Precautions: Fall Restrictions Weight Bearing Restrictions: No General:   Pain: No c/o or signs of pain during treatment.  See Function Navigator for Current Functional Status.   Therapy/Group: Individual Therapy  Rayford Halsted 05/03/2015, 12:26 PM

## 2015-05-04 NOTE — Progress Notes (Signed)
50 y.o. male with history of tobacco use, recent gout flare affecting bilateral thumbs and heavy alcohol use who was admitted on 04/26/15 am with right sided weakness and confusion. He was last seen normal the night before. CTA head/neck limited but excluded large vessel occlusion. MRI/MRA brain done revealing extensive areas of non-hemorrhagic infarct throughout much of L-MCA territory sparing basal ganglia, focal high grade stenosis or filling defect in L-MCA distal M1 segment and approximately 50% mid basilar stenosis. 2D echo with EF 55-60% with no wall abnormality and grade 1 diastolic dysfunction. TEE with EF 55-60% and no wall abnormality. No thrombus or PFO seen and loop recorder placed by Dr. Caryl Comes on [facility] evaluation without signs of aspiration and patient placed on dysphagia 3, thin liquids  Subjective/Complaints: Sitting in bed. Ate breakfast/good appetite. Pleased with progress ROS- sl limited due to language  Objective: Vital Signs: Blood pressure 101/61, pulse 80, temperature 98.1 F (36.7 C), temperature source Oral, resp. rate 18, height _0  (1.702 m), weight 61.644 kg (135 lb 14.4 oz), SpO2 97 %. No results found. Results for orders placed or performed during the hospital encounter of 05/01/15 (from the past 72 hour(s))  CBC WITH DIFFERENTIAL     Status: None   Collection Time: 05/02/15  6:00 AM  Result Value Ref Range   WBC 7.7 4.0 - 10.5 K/uL   RBC 4.68 4.22 - 5.81 MIL/uL   Hemoglobin 15.3 13.0 - 17.0 g/dL   HCT 43.9 39.0 - 52.0 %   MCV 93.8 78.0 - 100.0 fL   MCH 32.7 26.0 - 34.0 pg   MCHC 34.9 30.0 - 36.0 g/dL   RDW 11.7 11.5 - 15.5 %   Platelets 194 150 - 400 K/uL   Neutrophils Relative % 67 %   Neutro Abs 5.1 1.7 - 7.7 K/uL   Lymphocytes Relative 18 %   Lymphs Abs 1.4 0.7 - 4.0 K/uL   Monocytes Relative 13 %   Monocytes Absolute 1.0 0.1 - 1.0 K/uL   Eosinophils Relative 2 %   Eosinophils Absolute 0.2 0.0 - 0.7 K/uL   Basophils Relative 0 %   Basophils  Absolute 0.0 0.0 - 0.1 K/uL  Comprehensive metabolic panel     Status: Abnormal   Collection Time: 05/02/15  6:00 AM  Result Value Ref Range   Sodium 136 135 - 145 mmol/L   Potassium 3.9 3.5 - 5.1 mmol/L   Chloride 105 101 - 111 mmol/L   CO2 25 22 - 32 mmol/L   Glucose, Bld 111 (H) 65 - 99 mg/dL   BUN 8 6 - 20 mg/dL   Creatinine, Ser 0.66 0.61 - 1.24 mg/dL   Calcium 9.1 8.9 - 10.3 mg/dL   Total Protein 6.5 6.5 - 8.1 g/dL   Albumin 3.4 (L) 3.5 - 5.0 g/dL   AST 22 15 - 41 U/L   ALT 20 17 - 63 U/L   Alkaline Phosphatase 63 38 - 126 U/L   Total Bilirubin 0.6 0.3 - 1.2 mg/dL   GFR calc non Af Amer >60 >60 mL/min   GFR calc Af Amer >60 >60 mL/min    Comment: (NOTE) The eGFR has been calculated using the CKD EPI equation. This calculation has not been validated in all clinical situations. eGFR's persistently <60 mL/min signify possible Chronic Kidney Disease.    Anion gap 6 5 - 15  Creatinine, serum     Status: None   Collection Time: 05/03/15  5:05 AM  Result Value Ref Range  Creatinine, Ser 0.67 0.61 - 1.24 mg/dL   GFR calc non Af Amer >60 >60 mL/min   GFR calc Af Amer >60 >60 mL/min    Comment: (NOTE) The eGFR has been calculated using the CKD EPI equation. This calculation has not been validated in all clinical situations. eGFR's persistently <60 mL/min signify possible Chronic Kidney Disease.      HEENT: normal Cardio: RRR and no murmurs Resp: CTA B/L and Unlabored GI: BS positive and not tender Extremity:  No Edema Skin:   Intact Neuro: Alert/Oriented and Aphasic expressive---able to express thoughts in words/small phrases very well, still with word finding deficits Musc/Skel:  Normal Gen NAD   Assessment/Plan: 1. Functional deficits secondary to Gait disorder with decreased balance, mixed transcortical aphasia or apraxia secondary to Left posterior MCA distribution infarct which require 3+ hours per day of interdisciplinary therapy in a comprehensive inpatient  rehab setting. Physiatrist is providing close team supervision and 24 hour management of active medical problems listed below. Physiatrist and rehab team continue to assess barriers to discharge/monitor patient progress toward functional and medical goals. FIM: Function - Bathing Position: Shower Body parts bathed by patient: Right arm, Left arm, Chest, Abdomen, Front perineal area, Buttocks, Right upper leg, Left upper leg, Right lower leg, Left lower leg, Back Assist Level: Set up, Supervision or verbal cues Set up : To obtain items, To adjust water temperature  Function- Upper Body Dressing/Undressing What is the patient wearing?: Pull over shirt/dress Pull over shirt/dress - Perfomed by patient: Thread/unthread right sleeve, Thread/unthread left sleeve, Put head through opening, Pull shirt over trunk Button up shirt - Perfomed by patient: Thread/unthread right sleeve, Thread/unthread left sleeve, Pull shirt around back, Button/unbutton shirt Assist Level: Supervision or verbal cues Function - Lower Body Dressing/Undressing What is the patient wearing?: Pants, Socks, Shoes Position: Sitting EOB Underwear - Performed by patient: Thread/unthread right underwear leg, Thread/unthread left underwear leg, Pull underwear up/down Pants- Performed by patient: Thread/unthread right pants leg, Thread/unthread left pants leg, Pull pants up/down, Fasten/unfasten pants Socks - Performed by patient: Don/doff right sock, Don/doff left sock Shoes - Performed by patient: Don/doff right shoe, Don/doff left shoe, Fasten right, Fasten left Assist for footwear: Setup Assist for lower body dressing: Supervision or verbal cues  Function - Toileting Toileting steps completed by patient: Adjust clothing prior to toileting, Performs perineal hygiene, Adjust clothing after toileting Toileting Assistive Devices: Grab bar or rail Assist level: Supervision or verbal cues, Touching or steadying assistance  (Pt.75%)  Function - Air cabin crew transfer assistive device: Grab bar Assist level to toilet: Touching or steadying assistance (Pt > 75%) Assist level from toilet: Touching or steadying assistance (Pt > 75%)  Function - Chair/bed transfer Chair/bed transfer method: Ambulatory Chair/bed transfer assist level: Touching or steadying assistance (Pt > 75%) Chair/bed transfer details: Visual cues/gestures for precautions/safety  Function - Locomotion: Wheelchair Will patient use wheelchair at discharge?: No (pt ambulatory) Function - Locomotion: Ambulation Assistive device: No device Max distance: 200 Assist level: Supervision or verbal cues Assist level: Supervision or verbal cues Assist level: Supervision or verbal cues Assist level: Supervision or verbal cues Assist level: Touching or steadying assistance (Pt > 75%)  Function - Comprehension Comprehension: Auditory Comprehension assist level: Follows basic conversation/direction with extra time/assistive device  Function - Expression Expression: Verbal Expression assist level: Expresses basic needs/ideas: With extra time/assistive device  Function - Social Interaction Social Interaction assist level: Interacts appropriately 75 - 89% of the time - Needs redirection for appropriate language  or to initiate interaction.  Function - Problem Solving Problem solving assist level: Solves basic 25 - 49% of the time - needs direction more than half the time to initiate, plan or complete simple activities  Function - Memory Memory assist level: Recognizes or recalls 25 - 49% of the time/requires cueing 50 - 75% of the time Patient normally able to recall (first 3 days only): That he or she is in a hospital   Medical Problem List and Plan: 1.   Gait disorder with decreased balance, mixed transcortical aphasia or apraxia secondary to Left posterior MCA distribution infarct-   -continue CIR therapies  -improved expressive  language 2.  DVT Prophylaxis/Anticoagulation: Pharmaceutical: Lovenox 3. Pain Management: Will continue tramadol qid, has headache relieved with tramadol. topamax effective 4. Mood: No signs of distress noted but patient with significant aphasia as well as lack of awareness of deficits. LCSW to follow along for evaluation and support as appropriate.   5. Neuropsych: This patient is not capable of making decisions on his own behalf. 6. Skin/Wound Care: Routine pressure relief measures.   7. Fluids/Electrolytes/Nutrition: Monitor I/O. All lytes reviewed. Within normal limits   8. HTN: Monitor BP tid with permissive HTN to allow for adequate perfusion.  Lisinopril initiated with improvement.   9. Dyslipidemia: On lipitor 10. Constipation:  bowel program.    LOS (Days) 3 A FACE TO FACE EVALUATION WAS PERFORMED  SWARTZ,ZACHARY T 05/04/2015, 8:07 AM

## 2015-05-04 NOTE — Plan of Care (Signed)
Problem: RH BOWEL ELIMINATION Goal: RH STG MANAGE BOWEL W/MEDICATION W/ASSISTANCE STG Manage Bowel with Medication with min Assistance.  Outcome: Not Progressing Pt on miralax, no bowel movement since 20th.

## 2015-05-05 ENCOUNTER — Inpatient Hospital Stay (HOSPITAL_COMMUNITY): Payer: Medicaid Other | Admitting: Occupational Therapy

## 2015-05-05 ENCOUNTER — Inpatient Hospital Stay (HOSPITAL_COMMUNITY): Payer: Medicaid Other | Admitting: Physical Therapy

## 2015-05-05 ENCOUNTER — Inpatient Hospital Stay (HOSPITAL_COMMUNITY): Payer: Medicaid Other | Admitting: Speech Pathology

## 2015-05-05 DIAGNOSIS — I69320 Aphasia following cerebral infarction: Secondary | ICD-10-CM

## 2015-05-05 NOTE — Progress Notes (Signed)
Occupational Therapy Session Note  Patient Details  Name: Gary Barker MRN: [id number] Date of Birth: 22-Jul-1965  Today's Date: 05/05/2015 OT Individual Time: 6578-4696 OT Individual Time Calculation (min): 58 min    Short Term Goals: Week 1:  OT Short Term Goal 1 (Week 1): STG = LTGs due to ELOS  Skilled Therapeutic Interventions/Progress Updates:  Upon entering the room, pt supine in bed with c/o "small " headache. Pt agreeable to OT intervention this session and requesting to shower. Pt performed bed mobility tasks with supervision. Pt ambulating in room with supervision and mod multimodal cues to obtain clothing items from bag and for set up of shower. Pt standing in walk in shower with supervision for safety. Pt changing temperature of water as needed. Pt returning to sit on EOB for dressing tasks with increased time and min demonstrational cues for functional task. Pt dropping glasses and breaking in the middle. Pt very upset and requiring max verbal cues to attend to other tasks as he was wanting to glue glasses back together. OT taping glasses so they could be used for remainder of session. Pt ambulated in hallway with close supervision 150' without AD. Pt able to navigate back to room without difficulty. Pt returned bed at end of session with bed alarm activated and call bell within reach upon exiting the room.   Therapy Documentation Precautions:  Precautions Precautions: Fall Restrictions Weight Bearing Restrictions: No General:   Vital Signs:   Pain: Pain Assessment Pain Assessment: 0-10 Pain Score: 2  Pain Type: Acute pain Pain Location: Head Pain Descriptors / Indicators: Aching;Headache Pain Frequency: Intermittent Pain Onset: On-going Patients Stated Pain Goal: 2 Pain Intervention(s): Medication (See eMAR) Multiple Pain Sites: No  See Function Navigator for Current Functional Status.   Therapy/Group: Individual Therapy  Lowella Grip 05/05/2015, 12:47  PM

## 2015-05-05 NOTE — Progress Notes (Signed)
49 y.o. male with history of tobacco use, recent gout flare affecting bilateral thumbs and heavy alcohol use who was admitted on 04/26/15 am with right sided weakness and confusion. He was last seen normal the night before. CTA head/neck limited but excluded large vessel occlusion. MRI/MRA brain done revealing extensive areas of non-hemorrhagic infarct throughout much of L-MCA territory sparing basal ganglia, focal high grade stenosis or filling defect in L-MCA distal M1 segment and approximately 50% mid basilar stenosis. 2D echo with EF 55-60% with no wall abnormality and grade 1 diastolic dysfunction. TEE with EF 55-60% and no wall abnormality. No thrombus or PFO seen and loop recorder placed by Dr. Caryl Comes on [facility] evaluation without signs of aspiration and patient placed on dysphagia 3, thin liquids  Subjective/Complaints: I'm ok Drowsy but awakens from sleep easily ROS- sl limited due to language  Objective: Vital Signs: Blood pressure 118/70, pulse 81, temperature 97.9 F (36.6 C), temperature source Oral, resp. rate 18, height 5' 7" (1.702 m), weight 61.644 kg (135 lb 14.4 oz), SpO2 94 %. No results found. Results for orders placed or performed during the hospital encounter of 05/01/15 (from the past 72 hour(s))  Creatinine, serum     Status: None   Collection Time: 05/03/15  5:05 AM  Result Value Ref Range   Creatinine, Ser 0.67 0.61 - 1.24 mg/dL   GFR calc non Af Amer >60 >60 mL/min   GFR calc Af Amer >60 >60 mL/min    Comment: (NOTE) The eGFR has been calculated using the CKD EPI equation. This calculation has not been validated in all clinical situations. eGFR's persistently <60 mL/min signify possible Chronic Kidney Disease.      HEENT: normal Cardio: RRR and no murmurs Resp: CTA B/L and Unlabored GI: BS positive and not tender Extremity:  No Edema Skin:   Intact Neuro: Alert/Oriented and Aphasic expressive---able to express thoughts in words/small phrases very well,  still with word finding deficits Musc/Skel:  Normal Gen NAD   Assessment/Plan: 1. 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 Physiatrist is providing close team supervision and 24 hour management of active medical problems listed below. Physiatrist and rehab team continue to assess barriers to discharge/monitor patient progress toward functional and medical goals. FIM: Function - Bathing Position: Shower Body parts bathed by patient: Right arm, Left arm, Chest, Abdomen, Front perineal area, Buttocks, Right upper leg, Left upper leg, Right lower leg, Left lower leg, Back Assist Level: Set up, Supervision or verbal cues Set up : To obtain items, To adjust water temperature  Function- Upper Body Dressing/Undressing What is the patient wearing?: Pull over shirt/dress Pull over shirt/dress - Perfomed by patient: Thread/unthread right sleeve, Thread/unthread left sleeve, Put head through opening, Pull shirt over trunk Button up shirt - Perfomed by patient: Thread/unthread right sleeve, Thread/unthread left sleeve, Pull shirt around back, Button/unbutton shirt Assist Level: Supervision or verbal cues Function - Lower Body Dressing/Undressing What is the patient wearing?: Pants, Socks, Shoes Position: Sitting EOB Underwear - Performed by patient: Thread/unthread right underwear leg, Thread/unthread left underwear leg, Pull underwear up/down Pants- Performed by patient: Thread/unthread right pants leg, Thread/unthread left pants leg, Pull pants up/down, Fasten/unfasten pants Socks - Performed by patient: Don/doff right sock, Don/doff left sock Shoes - Performed by patient: Don/doff right shoe, Don/doff left shoe, Fasten right, Fasten left Assist  for footwear: Setup Assist for  lower body dressing: Supervision or verbal cues  Function - Toileting Toileting steps completed by patient: Adjust clothing prior to toileting, Performs perineal hygiene, Adjust clothing after toileting Toileting Assistive Devices: Grab bar or rail Assist level: Supervision or verbal cues  Function - Toilet Transfers Toilet transfer assistive device: Grab bar Assist level to toilet: Supervision or verbal cues Assist level from toilet: Supervision or verbal cues  Function - Chair/bed transfer Chair/bed transfer method: Ambulatory Chair/bed transfer assist level: Touching or steadying assistance (Pt > 75%) Chair/bed transfer details: Visual cues/gestures for precautions/safety  Function - Locomotion: Wheelchair Will patient use wheelchair at discharge?: No (pt ambulatory) Function - Locomotion: Ambulation Assistive device: No device Max distance: 200 Assist level: Supervision or verbal cues Assist level: Supervision or verbal cues Assist level: Supervision or verbal cues Assist level: Supervision or verbal cues Assist level: Touching or steadying assistance (Pt > 75%)  Function - Comprehension Comprehension: Auditory Comprehension assist level: Follows basic conversation/direction with extra time/assistive device  Function - Expression Expression: Verbal Expression assist level: Expresses basic needs/ideas: With extra time/assistive device  Function - Social Interaction Social Interaction assist level: Interacts appropriately 75 - 89% of the time - Needs redirection for appropriate language or to initiate interaction.  Function - Problem Solving Problem solving assist level: Solves basic 25 - 49% of the time - needs direction more than half the time to initiate, plan or complete simple activities  Function - Memory Memory assist level: Recognizes or recalls 25 - 49% of the time/requires cueing 50 - 75% of the time Patient normally able to recall (first 3 days only): Staff  names and faces, That he or she is in a hospital   Medical Problem List and Plan: 1.   Gait disorder with decreased balance, mixed transcortical aphasia or apraxia secondary to Left posterior MCA distribution infarct- EC ASA 325mg  -continue CIR therapies  -improved expressive language 2.  DVT Prophylaxis/Anticoagulation: Pharmaceutical: Lovenox 3. Pain Management: Will continue tramadol qid, has headache relieved with tramadol. topamax effective 4. Mood: No signs of distress noted but patient with significant aphasia as well as lack of awareness of deficits. LCSW to follow along for evaluation and support as appropriate.   5. Neuropsych: This patient is not capable of making decisions on his own behalf. 6. Skin/Wound Care: Routine pressure relief measures.   7. Fluids/Electrolytes/Nutrition: Monitor I/O. ~100% meals with good fluid intake, lab reviewed Creat nl .67 8. HTN: Monitor BP tid with permissive HTN to allow for adequate perfusion.  Lisinopril, 118/70 9. Dyslipidemia: On lipitor 10. Constipation:  bowel program.    LOS (Days) 4 A FACE TO FACE EVALUATION WAS PERFORMED  KIRSTEINS,ANDREW E 05/05/2015, 6:30 AM    

## 2015-05-05 NOTE — Progress Notes (Signed)
Occupational Therapy Session Note  Patient Details  Name: Gary Barker MRN: [id number] Date of Birth: [birth year]  Today's Date: 05/05/2015 OT Individual Time: 1330-1350 OT Individual Time Calculation (min): 20 min  and Today's Date: 05/05/2015 OT Concurrent Time: 1350-1430 OT Concurrent Time Calculation (min): 40 min   Short Term Goals: Week 1:  OT Short Term Goal 1 (Week 1): STG = LTGs due to ELOS  Skilled Therapeutic Interventions/Progress Updates:    Concurrent treatment session with focus on safety in home environment and functional use of dominant RUE.  Engaged in frying an egg with pt requiring mod verbal cues for sequencing and safety.  Pt ambulating around kitchen at distant supervision level with no LOB.  Pt gathered all items, however when it came time to sequence placing items in the pan - pt cracked and placed eggs and then attempted to spray cooking spray into pan.  Pt's wife present and reports that he does not use cooking spray but butter, pt still not placing butter in pan first.  Pt almost sprayed cooking spray in face with therapist stopping him.  Pt very quick and impulsive throughout cooking task, repeatedly stating "I make eggs" when therapist discussing supervision for cooking tasks to promote cognitive carryover with sequencing and safety.  Pt removed items from burner and cleaned up, but did not turn off burner.  Verbal cues for correct handling of scrub brush when washing dishes, requiring hand over hand to correct. Engaged in RUE therapeutic activities with theraputty with focus on functional use of RUE.  Pt required cues to incorporate RUE into tasks and intermittent hand over hand to increase functional use of RUE.  Educated pt and pt's wife on increased use of RUE during self-feeding and self-care tasks.  Pt returned to room and left reclined in bed with wife present.  Therapy Documentation Precautions:  Precautions Precautions: Fall Restrictions Weight Bearing  Restrictions: No Pain: Pain Assessment Pain Assessment: 0-10 Pain Score: 4  Pain Type: Acute pain Pain Location: Head Pain Descriptors / Indicators: Aching Pain Frequency: Intermittent Pain Onset: On-going Patients Stated Pain Goal: 2 Pain Intervention(s): Medication (See eMAR) Multiple Pain Sites: No  See Function Navigator for Current Functional Status.   Therapy/Group: Individual Therapy  Rosalio Loud 05/05/2015, 3:19 PM

## 2015-05-05 NOTE — Progress Notes (Signed)
Speech Language Pathology Daily Session Note  Patient Details  Name: Gary Barker MRN: [id number] Date of Birth: 15-Oct-1965  Today's Date: 05/05/2015 SLP Individual Time: 6578-4696 SLP Individual Time Calculation (min): 30 min  Short Term Goals: Week 1: SLP Short Term Goal 1 (Week 1): Pt will name basic, familiar objects for 75% accuracy with max assist verbal cues.  SLP Short Term Goal 2 (Week 1): Pt will follow 1 step commands for 75% accuracy with max assist verbal, visual, and tactile cues.  SLP Short Term Goal 3 (Week 1): Pt will sustain his attention to task for 5-7 minute intervals with mod assist verbal cues for redirection.  SLP Short Term Goal 4 (Week 1): Pt will use call bell in 50% of observable opportunities with max assist verbal and visual cues.   SLP Short Term Goal 5 (Week 1): Pt will return demonstration of at least 2 safety precautions with max assist verbal and visual cues.    Skilled Therapeutic Interventions:   Pt participated with skilled speech therapy to address expressive and receptive aphasia. Pt was fixated on providing therapist the relation of family members present in photographs. Pt demo'd significant difficulty with labeling relationships despite giving limited options. Labeling common objects 4/15 at mod I for increased time. Pt able to arrive at target word with semantic and phonemic cueing with 7/11 remaining items. Phonemic cues in isolation were not as beneficial as semantic cueing. 1-step directive following completed at 1/5 acc at mod I level- pt required modeling to complete the remaining directives.   Function:  Eating Eating                 Cognition Comprehension Comprehension assist level: Understands basic less than 25% of the time/ requires cueing >75% of the time  Expression   Expression assist level: Expresses basic 25 - 49% of the time/requires cueing 50 - 75% of the time. Uses single words/gestures.  Social Interaction Social  Interaction assist level: Interacts appropriately 75 - 89% of the time - Needs redirection for appropriate language or to initiate interaction.  Problem Solving Problem solving assist level: Solves basic 25 - 49% of the time - needs direction more than half the time to initiate, plan or complete simple activities  Memory Memory assist level: Recognizes or recalls 25 - 49% of the time/requires cueing 50 - 75% of the time    Pain Pain Assessment Pain Assessment: No/denies pain   Therapy/Group: Individual Therapy  Rocky Crafts 05/05/2015, 3:26 PM

## 2015-05-05 NOTE — Progress Notes (Signed)
Physical Therapy Session Note  Patient Details  Name: Gary Barker MRN: [id number] Date of Birth: 1966/01/16  Today's Date: 05/05/2015 PT Individual Time: 1450-1535 PT Individual Time Calculation (min): 45 min   Short Term Goals: Week 1:  PT Short Term Goal 1 (Week 1): =LTG due to estimated length of stay  Skilled Therapeutic Interventions/Progress Updates:    Pt received seated in bed, wife present. No c/o pain and agreeable to treatment. Pt does indicate later in session that chest is hurting, and points to healing incision, but states "it's fine". Gait 2x 175' with S, occasional staggering however able to recover without assist. Sitting/standing bean bag sorting by food group to focus on communication, object identification. Requires mod/max verbal, contextual cues to correctly name various food objects, and modA to accurately sort into meal groups. Activity including dynamic standing, retrieving objects off floor all performed with S. Dynavision x3 trials with significant increase in reaction time on R hemisphere compared to L (2.10 sec L vs 1.35 sec R). Ambulation to return to room, and pt used bathroom with S. Remained seated in bed with all needs in reach at completion of session. Wife asking about social worker meeting her at 1pm today who she didn't see; pt's CSW scheduled off today and assuming pt means Careers information officer. Will have CSW communicate with wife tomorrow; wife understanding.   Therapy Documentation Precautions:  Precautions Precautions: Fall Restrictions Weight Bearing Restrictions: No Pain: Pain Assessment Pain Assessment: No/denies pain Pain Score: 0-No pain Pain Type: Acute pain Pain Location: Head Pain Descriptors / Indicators: Aching Pain Frequency: Intermittent Pain Onset: On-going Patients Stated Pain Goal: 2 Pain Intervention(s): Medication (See eMAR) Multiple Pain Sites: No   See Function Navigator for Current Functional Status.   Therapy/Group:  Individual Therapy  [name] 05/05/2015, 3:48 PM

## 2015-05-06 ENCOUNTER — Inpatient Hospital Stay (HOSPITAL_COMMUNITY): Payer: Medicaid Other | Admitting: Speech Pathology

## 2015-05-06 ENCOUNTER — Inpatient Hospital Stay (HOSPITAL_COMMUNITY): Payer: Medicaid Other | Admitting: Physical Therapy

## 2015-05-06 ENCOUNTER — Inpatient Hospital Stay (HOSPITAL_COMMUNITY): Payer: Medicaid Other | Admitting: Occupational Therapy

## 2015-05-06 DIAGNOSIS — I69991 Dysphagia following unspecified cerebrovascular disease: Secondary | ICD-10-CM

## 2015-05-06 DIAGNOSIS — I69920 Aphasia following unspecified cerebrovascular disease: Secondary | ICD-10-CM

## 2015-05-06 MED ORDER — SENNOSIDES-DOCUSATE SODIUM 8.6-50 MG PO TABS
2.0000 | ORAL_TABLET | Freq: Every day | ORAL | Status: DC
  Administered 2015-05-06: 2 via ORAL
  Filled 2015-05-06: qty 2

## 2015-05-06 NOTE — Progress Notes (Signed)
Subjective/Complaints: Remains aphasic both receptive and expressive, smiles and says ok to questions Wife at bedside, states he slept well ROS-  limited due to language  Objective: Vital Signs: Blood pressure 119/77, pulse 77, temperature 97.6 F (36.4 C), temperature source Oral, resp. rate 17, height  (1.702 m), weight 61.644 kg (135 lb 14.4 oz), SpO2 99 %. No results found. No results found for this or any previous visit (from the past 72 hour(s)).   HEENT: normal Cardio: RRR and no murmurs Resp: CTA B/L and Unlabored GI: BS positive and not tender Extremity:  No Edema Skin:   Intact Neuro: Alert/Oriented and Aphasic and receptive, 5/5 in BUE and BLE Musc/Skel:  Normal Gen NAD   Assessment/Plan: 1. 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 Physiatrist is providing close team supervision and 24 hour management of active medical problems listed below. Physiatrist and rehab team continue to assess barriers to discharge/monitor patient progress toward functional and medical goals. FIM: Function - Bathing Position: Shower Body parts bathed by patient: Right arm, Left arm, Chest, Abdomen, Front perineal area, Buttocks, Right upper leg, Left upper leg, Right lower leg, Left lower leg, Back Assist Level: Supervision or verbal cues Set up : To obtain items, To adjust water temperature  Function- Upper Body Dressing/Undressing What is the patient wearing?: Pull over shirt/dress Pull over shirt/dress - Perfomed by patient: Thread/unthread right sleeve, Thread/unthread left sleeve, Put head through opening, Pull shirt over trunk Button up shirt - Perfomed by patient: Thread/unthread right sleeve, Thread/unthread left sleeve, Pull shirt around back,  Button/unbutton shirt Assist Level: Supervision or verbal cues Function - Lower Body Dressing/Undressing What is the patient wearing?: Pants, Shoes Position: Sitting EOB Underwear - Performed by patient: Thread/unthread right underwear leg, Thread/unthread left underwear leg, Pull underwear up/down Pants- Performed by patient: Thread/unthread right pants leg, Thread/unthread left pants leg, Pull pants up/down, Fasten/unfasten pants Socks - Performed by patient: Don/doff right sock, Don/doff left sock Shoes - Performed by patient: Don/doff right shoe, Don/doff left shoe, Fasten right, Fasten left Assist for footwear: Setup Assist for lower body dressing: Supervision or verbal cues  Function - Toileting Toileting steps completed by patient: Adjust clothing prior to toileting, Performs perineal hygiene, Adjust clothing after toileting Toileting Assistive Devices: Grab bar or rail Assist level: Supervision or verbal cues  Function Programmer, multimedia transfer assistive device: Grab bar Assist level to toilet: Supervision or verbal cues Assist level from toilet: Supervision or verbal cues  Function - Chair/bed transfer Chair/bed transfer method: Ambulatory Chair/bed transfer assist level: Supervision or verbal cues Chair/bed transfer details: Verbal cues for precautions/safety  Function - Locomotion: Wheelchair Will patient use wheelchair at discharge?: No (pt ambulatory) Function - Locomotion: Ambulation Assistive device: No device Max distance: 175 Assist level: Supervision or verbal cues Assist level: Supervision or verbal cues Assist level: Supervision or verbal cues Assist level: Supervision or verbal cues Assist level: Touching or steadying assistance (Pt > 75%)  Function - Comprehension Comprehension: Auditory Comprehension assist level: Understands basic less than 25% of the time/ requires cueing >75% of the time  Function - Expression Expression:  Verbal Expression assist level: Expresses basic 25 - 49% of the time/requires cueing 50 - 75% of the time. Uses single words/gestures.  Function - Social Interaction Social Interaction assist level: Interacts appropriately 75 -  89% of the time - Needs redirection for appropriate language or to initiate interaction.  Function - Problem Solving Problem solving assist level: Solves basic 25 - 49% of the time - needs direction more than half the time to initiate, plan or complete simple activities  Function - Memory Memory assist level: Recognizes or recalls 25 - 49% of the time/requires cueing 50 - 75% of the time Patient normally able to recall (first 3 days only): Staff names and faces, That he or she is in a hospital   Medical Problem List and Plan: 1.   Gait disorder with decreased balance, mixed transcortical aphasia and apraxia secondary to Left posterior MCA distribution infarct- EC ASA   -continue CIR therapies  -improved expressive language 2.  DVT Prophylaxis/Anticoagulation: Pharmaceutical: Lovenox 3. Pain Management: post CVA headache cont Topamax  Qhs 4. Mood: No signs of distress noted but patient with significant aphasia as well as lack of awareness of deficits. LCSW to follow along for evaluation and support as appropriate.   5. Neuropsych: This patient is not capable of making decisions on his own behalf. 6. Skin/Wound Care: Routine pressure relief measures.   7. Fluids/Electrolytes/Nutrition: Monitor I/O.  8. HTN: Monitor BP tid with permissive HTN to allow for adequate perfusion.  Lisinopril 2.5 mg qhs, 119/77 9. Dyslipidemia: On lipitor 10. Constipation:  Miralax BID, prn Fleets,senna S    LOS (Days) 5 A FACE TO FACE EVALUATION WAS PERFORMED  Laymond Postle E 05/06/2015, 6:57 AM

## 2015-05-06 NOTE — Plan of Care (Signed)
Problem: Food- and Nutrition-Related Knowledge Deficit (NB-1.1) Goal: Nutrition education Formal process to instruct or train a patient/client in a skill or to impart knowledge to help patients/clients voluntarily manage or modify food choices and eating behavior to maintain or improve health.  Outcome: Completed/Met Date Met:  05/06/15  RD consulted for nutrition education regarding diabetes.     Lab Results  Component Value Date    HGBA1C 6.5* 04/27/2015    Wife was not present at bedside. Pt reports she will not be back for a good while and would like the education be presented to him instead. RD provided "Carbohydrate Counting for People with Diabetes" handout from the Academy of [employer]. Discussed different food groups and their effects on blood sugar, emphasizing carbohydrate-containing foods. Provided list of carbohydrates and recommended serving sizes of common foods.  Discussed importance of controlled and consistent carbohydrate intake throughout the day. Provided examples of ways to balance meals/snacks and encouraged intake of high-fiber, whole grain complex carbohydrates. Discussed diabetic friendly drink options. Teach back method used.  Expect good compliance.  Body mass index is 21.28 kg/(m^2). Pt meets criteria for normal based on current BMI. Weight has been stable.  Current diet order is carbohydrate modified, patient is consuming approximately 100% of meals at this time. Appetite good. Labs and medications reviewed. No further nutrition interventions warranted at this time. RD contact information provided. If additional nutrition issues arise, please re-consult RD.  Corrin Parker, MS, RD, LDN Pager # [phone] After hours/ weekend pager # [phone]

## 2015-05-06 NOTE — Progress Notes (Signed)
Physical Therapy Session Note  Patient Details  Name: Gary Barker MRN: [id number] Date of Birth: 11/24/1965  Today's Date: 05/06/2015 PT Individual Time: 0900-1000 PT Individual Time Calculation (min): 60 min   Short Term Goals: Week 1:  PT Short Term Goal 1 (Week 1): =LTG due to estimated length of stay  Skilled Therapeutic Interventions/Progress Updates:    Pt received seated in bed, no c/o pain and agreeable to treatment. Dynamic gait in hallway 4 x 175' with S overall, with dynamic balance and cognitive dual task challenges including navigation to various parts of unit to assess memory and topographical orientation, carrying objects for ADL training, and pt engaged in conversation to increase cognitive demand while ambulating. In standing for endurance, performed large size 24-piece puzzle. Initially required max cueing for sequencing, problem solving, however reduced to min/S after several pieces placed. Nustep x10 min on level 3 with BUE/BLE for strengthening and endurance. Dynamic standing balance while dribbling basketball, navigating around cones. Shooting basketball with S including retrieving ball from floor, quick directional changes; no significant LOB. Bathroom transfer with mod I. Returned to room and remained seated in bed with all needs in reach.   Therapy Documentation Precautions:  Precautions Precautions: Fall Restrictions Weight Bearing Restrictions: No Pain: Pain Assessment Pain Assessment: No/denies pain Pain Score: 0-No pain Faces Pain Scale: No hurt Pain Type: Acute pain Pain Location: Head Pain Descriptors / Indicators: Aching Pain Frequency: Intermittent Pain Onset: Gradual Patients Stated Pain Goal: 2 Pain Intervention(s): Medication (See eMAR)   See Function Navigator for Current Functional Status.   Therapy/Group: Individual Therapy  [name] 05/06/2015, 9:57 AM

## 2015-05-06 NOTE — Progress Notes (Signed)
Social Work Patient ID: Gary Barker, male   DOB: 28-Jan-1966, 50 y.o.   MRN: [id number]   Amada Jupiter, CSW, gave wife a letter for her to present to her employer regarding her need to be here with pt to learn his care, that pt will have ongoing 24/7 supervision needs at d/c, and that wife would like to change shifts at work, if possible, to meet pt's needs at home in the near future.  CSW will also schedule an interpreter for pt and his wife for family education prior to pt's d/c.

## 2015-05-06 NOTE — Progress Notes (Signed)
Speech Language Pathology Daily Session Note  Patient Details  Name: Gary Barker MRN: [id number] Date of Birth: Jan 05, 1966  Today's Date: 05/06/2015 SLP Individual Time: 1300-1355 SLP Individual Time Calculation (min): 55 min  Short Term Goals: Week 1: SLP Short Term Goal 1 (Week 1): Pt will name basic, familiar objects for 75% accuracy with max assist verbal cues.  SLP Short Term Goal 2 (Week 1): Pt will follow 1 step commands for 75% accuracy with max assist verbal, visual, and tactile cues.  SLP Short Term Goal 3 (Week 1): Pt will sustain his attention to task for 5-7 minute intervals with mod assist verbal cues for redirection.  SLP Short Term Goal 4 (Week 1): Pt will use call bell in 50% of observable opportunities with max assist verbal and visual cues.   SLP Short Term Goal 5 (Week 1): Pt will return demonstration of at least 2 safety precautions with max assist verbal and visual cues.    Skilled Therapeutic Interventions: Skilled treatment session focused on receptive and expressive language goals. Upon arrival, patient was awake while upright in bed verbalizing, "I just don't understand" while looking at a piece of paper that he had written down a variety of biographical information on.  Patient was missing his road and was unable to understand why he could recall his name, number, city, state and zip code but not his address. SLP was able to locate his address and write it down. Patient required Max verbal, visual and demonstration cues to verbalize his address appropriately with impulsivity and perseveration with minimal awareness of errors. SLP facilitated session by utilizing auditory feedback to maximize awareness in which the patient required Mod-Max A question and visual cues to self-monitor errors by end of session. Patient named functional items with Max A phonemic and semantic cues with 100% accuracy. Patient left upright in bed with alarm on and all needs within reach. Continue with  current plan of care.   Function:  Cognition Comprehension Comprehension assist level: Understands basic less than 25% of the time/ requires cueing >75% of the time  Expression   Expression assist level: Expresses basic 25 - 49% of the time/requires cueing 50 - 75% of the time. Uses single words/gestures.  Social Interaction Social Interaction assist level: Interacts appropriately 75 - 89% of the time - Needs redirection for appropriate language or to initiate interaction.  Problem Solving Problem solving assist level: Solves basic 25 - 49% of the time - needs direction more than half the time to initiate, plan or complete simple activities  Memory Memory assist level: Recognizes or recalls 25 - 49% of the time/requires cueing 50 - 75% of the time    Pain No/Denies Pain   Therapy/Group: Individual Therapy  [name] 05/06/2015, 2:13 PM

## 2015-05-06 NOTE — Progress Notes (Signed)
Speech Language Pathology Daily Session Note  Patient Details  Name: Gary Barker MRN: [id number] Date of Birth: 01-16-1966  Today's Date: 05/06/2015 SLP Individual Time: 1525-1545 SLP Individual Time Calculation (min): 20 min  Short Term Goals: Week 1: SLP Short Term Goal 1 (Week 1): Pt will name basic, familiar objects for 75% accuracy with max assist verbal cues.  SLP Short Term Goal 2 (Week 1): Pt will follow 1 step commands for 75% accuracy with max assist verbal, visual, and tactile cues.  SLP Short Term Goal 3 (Week 1): Pt will sustain his attention to task for 5-7 minute intervals with mod assist verbal cues for redirection.  SLP Short Term Goal 4 (Week 1): Pt will use call bell in 50% of observable opportunities with max assist verbal and visual cues.   SLP Short Term Goal 5 (Week 1): Pt will return demonstration of at least 2 safety precautions with max assist verbal and visual cues.    Skilled Therapeutic Interventions: Skilled treatment session focused on addressing language goals.  SLP followed up from previous session with same task for carryover purposes.  Patient required Max verbal, visual and demonstration cues to verbalize his address appropriately with less impulsivity and perseveration as compared to previous session.  Patient with carryover of awareness of verbal errors but requires Max assist multimodal cues to correct.  Continue with current plan of care.   Function:  Cognition Comprehension Comprehension assist level: Understands basic less than 25% of the time/ requires cueing >75% of the time  Expression   Expression assist level: Expresses basic 25 - 49% of the time/requires cueing 50 - 75% of the time. Uses single words/gestures.  Social Interaction Social Interaction assist level: Interacts appropriately 75 - 89% of the time - Needs redirection for appropriate language or to initiate interaction.  Problem Solving Problem solving assist level: Solves basic 25 - 49%  of the time - needs direction more than half the time to initiate, plan or complete simple activities  Memory Memory assist level: Recognizes or recalls 25 - 49% of the time/requires cueing 50 - 75% of the time    Pain Pain Assessment Pain Assessment: No/denies pain  Therapy/Group: Individual Therapy  Charlane Ferretti., CCC-SLP [phone]  Gary Barker 05/06/2015, 5:01 PM

## 2015-05-06 NOTE — Progress Notes (Signed)
Occupational Therapy Session Note  Patient Details  Name: Gary Barker MRN: [id number] Date of Birth: March 16, 1966  Today's Date: 05/06/2015 OT Individual Time: 4098-1191 OT Individual Time Calculation (min): 55 min    Short Term Goals: Week 1:  OT Short Term Goal 1 (Week 1): STG = LTGs due to ELOS  Skilled Therapeutic Interventions/Progress Updates:    Treatment session with focus on safety with self-care tasks and mobility and functional use of dominant RUE.  Pt completed self-feeding with min cues to utilize RUE, as pt would tend to just finger feed with Lt hand.  Pt with appropriate use of utensil, utilizing RUE as dominant approx 75% of meal.  Educated on continued use of RUE to increase motor control and strength as pt required increased time to open packets.  Bathing completed with supervision provided by wife, she assisted with setup and supervised bathing in standing but pt did not require any cues for safety or sequencing.  Pt completed dressing in single leg stance, sitting to don socks and shoes.  Oral hygiene completed in standing without assist.  Reiterated recommendation for supervision with higher level tasks due to decreased safety awareness and impulsivity, unsure if pt or wife truly understanding and would benefit from education with interpreter.  Therapy Documentation Precautions:  Precautions Precautions: Fall Restrictions Weight Bearing Restrictions: No General:   Vital Signs: Therapy Vitals Temp: 97.6 F (36.4 C) Temp Source: Oral Pulse Rate: 77 Resp: 17 BP: 119/77 mmHg Patient Position (if appropriate): Lying Oxygen Therapy SpO2: 99 % O2 Device: Not Delivered Pain: Pain Assessment Pain Assessment: Faces Faces Pain Scale: No hurt Pain Type: Acute pain Pain Location: Head Pain Descriptors / Indicators: Aching Pain Frequency: Intermittent Pain Onset: Gradual Patients Stated Pain Goal: 2 Pain Intervention(s): Medication (See eMAR)  See Function Navigator  for Current Functional Status.   Therapy/Group: Individual Therapy  Rosalio Loud 05/06/2015, 8:47 AM

## 2015-05-06 NOTE — Plan of Care (Signed)
Problem: RH BOWEL ELIMINATION Goal: RH STG MANAGE BOWEL WITH ASSISTANCE STG Manage Bowel with min Assistance.  Outcome: Not Progressing LBM: 05/02/2015. On scheduled medications     Goal: RH STG MANAGE BOWEL W/MEDICATION W/ASSISTANCE STG Manage Bowel with Medication with min Assistance.  Outcome: Not Progressing LBM: 05/02/2015. On scheduled medications      Problem: RH SAFETY Goal: RH STG ADHERE TO SAFETY PRECAUTIONS W/ASSISTANCE/DEVICE STG Adhere to Safety Precautions With min Assistance/Device.  Outcome: Not Progressing Impulsive

## 2015-05-07 ENCOUNTER — Inpatient Hospital Stay (HOSPITAL_COMMUNITY): Payer: Medicaid Other | Admitting: *Deleted

## 2015-05-07 ENCOUNTER — Inpatient Hospital Stay (HOSPITAL_COMMUNITY): Payer: Medicaid Other | Admitting: Speech Pathology

## 2015-05-07 ENCOUNTER — Inpatient Hospital Stay (HOSPITAL_COMMUNITY): Payer: Medicaid Other | Admitting: Occupational Therapy

## 2015-05-07 MED ORDER — SENNOSIDES-DOCUSATE SODIUM 8.6-50 MG PO TABS
2.0000 | ORAL_TABLET | Freq: Two times a day (BID) | ORAL | Status: DC
Start: 2015-05-07 — End: 2015-05-08
  Administered 2015-05-07 (×2): 2 via ORAL
  Filled 2015-05-07 (×3): qty 2

## 2015-05-07 NOTE — Progress Notes (Signed)
Occupational Therapy Discharge Summary  Patient Details  Name: Gary Barker MRN: [id number] Date of Birth: December 27, 1965  Patient has met 12 of 12 long term goals due to improved balance, ability to compensate for deficits, functional use of  RIGHT upper extremity, improved attention and improved awareness.  Patient to discharge at overall Modified Independent level with basic self-care tasks, recommend supervision for higher level tasks secondary to cognitive deficits and safety awareness.  Patient's care partner is independent to provide the necessary cognitive assistance at discharge.    Reasons goals not met: N/A  Recommendation:  Patient will not require follow up OT services at this time.  Equipment: No equipment provided  Reasons for discharge: treatment goals met and discharge from hospital  Patient/family agrees with progress made and goals achieved: Yes  OT Discharge Precautions/Restrictions  Precautions Precautions: Fall Restrictions Weight Bearing Restrictions: No General   Vital Signs Therapy Vitals Temp: 97.6 F (36.4 C) Temp Source: Oral Pulse Rate: 67 Resp: 18 BP: 98/66 mmHg Patient Position (if appropriate): Lying Oxygen Therapy SpO2: 97 % O2 Device: Not Delivered Pain Pain Assessment Pain Assessment: No/denies pain Pain Score: 0-No pain ADL  See Function Navigator Vision/Perception  Vision- History Baseline Vision/History: Wears glasses Wears Glasses: At all times Patient Visual Report: No change from baseline  Cognition Overall Cognitive Status: Impaired/Different from baseline Arousal/Alertness: Awake/alert Orientation Level: Oriented X4 Attention: Sustained Sustained Attention: Impaired Sustained Attention Impairment: Verbal basic;Functional basic Selective Attention: Impaired Selective Attention Impairment: Verbal basic;Functional basic Awareness: Impaired Awareness Impairment: Emergent impairment Problem Solving: Impaired Problem Solving  Impairment: Functional basic;Verbal basic Safety/Judgment: Impaired Sensation Sensation Light Touch: Appears Intact Proprioception: Appears Intact Additional Comments: difficult to formally assess due to aphasia, however functionally appears intact throughout Coordination Gross Motor Movements are Fluid and Coordinated: Yes Finger Nose Finger Test: difficult to assess secondary to aphasia and decreased ability to follow one step directions Heel Shin Test: [facility] Motor  Motor Motor: Hemiplegia Motor - Discharge Observations: mild R extremity hemiparesis, improved over evaluation to near-baseline Mobility  Bed Mobility Bed Mobility: Sit to Supine;Supine to Sit Supine to Sit: 6: Modified independent (Device/Increase time) Sit to Supine: 6: Modified independent (Device/Increase time)  Trunk/Postural Assessment  Cervical Assessment Cervical Assessment: Within Functional Limits Thoracic Assessment Thoracic Assessment: Within Functional Limits Lumbar Assessment Lumbar Assessment: Within Functional Limits Postural Control Postural Control: Within Functional Limits  Balance Balance Balance Assessed: Yes Standardized Balance Assessment Standardized Balance Assessment: Berg Balance Test;Dynamic Gait Index Berg Balance Test Sit to Stand: Able to stand without using hands and stabilize independently Standing Unsupported: Able to stand safely 2 minutes Sitting with Back Unsupported but Feet Supported on Floor or Stool: Able to sit safely and securely 2 minutes Stand to Sit: Sits safely with minimal use of hands Transfers: Able to transfer safely, minor use of hands Standing Unsupported with Eyes Closed: Able to stand 10 seconds safely Standing Ubsupported with Feet Together: Able to place feet together independently and stand 1 minute safely From Standing, Reach Forward with Outstretched Arm: Can reach confidently >25 cm (10") From Standing Position, Pick up Object from Floor: Able to pick  up shoe safely and easily From Standing Position, Turn to Look Behind Over each Shoulder: Looks behind from both sides and weight shifts well Turn 360 Degrees: Able to turn 360 degrees safely in 4 seconds or less Standing Unsupported, Alternately Place Feet on Step/Stool: Able to stand independently and safely and complete 8 steps in 20 seconds Standing Unsupported, One Foot in Front: Able  to place foot tandem independently and hold 30 seconds Standing on One Leg: Able to lift leg independently and hold > 10 seconds Total Score: 56 Dynamic Gait Index Level Surface: Normal Change in Gait Speed: Normal Gait with Horizontal Head Turns: Normal Gait with Vertical Head Turns: Normal Gait and Pivot Turn: Mild Impairment Step Over Obstacle: Normal Step Around Obstacles: Normal Steps: Normal Total Score: 23 Static Sitting Balance Static Sitting - Balance Support: No upper extremity supported;Feet supported Static Sitting - Level of Assistance: 7: Independent Dynamic Sitting Balance Dynamic Sitting - Balance Support: Feet supported;During functional activity Dynamic Sitting - Level of Assistance: 6: Modified independent (Device/Increase time) Dynamic Sitting - Balance Activities: Lateral lean/weight shifting;Reaching for weighted objects;Reaching for objects;Forward lean/weight shifting;Reaching across midline Static Standing Balance Static Standing - Balance Support: No upper extremity supported Static Standing - Level of Assistance: 6: Modified independent (Device/Increase time) Static Stance: On foam Static Stance: Eyes Closed: 30 sec mod I Static Stance: on Foam: 30 sec S Dynamic Standing Balance Dynamic Standing - Balance Support: No upper extremity supported;During functional activity Dynamic Standing - Level of Assistance: 6: Modified independent (Device/Increase time) Dynamic Standing - Balance Activities: Reaching for objects;Reaching across midline Extremity/Trunk Assessment RUE  Assessment RUE Assessment: Within Functional Limits LUE Assessment LUE Assessment: Within Functional Limits   See Function Navigator for Current Functional Status.  Simonne Come 05/07/2015, 3:42 PM

## 2015-05-07 NOTE — Progress Notes (Signed)
Physical Therapy Discharge Summary  Patient Details  Name: Gary Barker MRN: [id number] Date of Birth: Dec 05, 1965  Today's Date: 05/07/2015 PT Individual Time: 1000-1100 PT Individual Time Calculation (min): 60 min    Patient has met 12 of 12 long term goals due to improved activity tolerance, improved balance, improved postural control, increased strength, functional use of  right lower extremity, improved attention, improved awareness and improved coordination.  Patient to discharge at an ambulatory level Supervision.   Patient's care partner is independent to provide the necessary cognitive assistance at discharge.  Reasons goals not met: All goals met  Recommendation:  Do not anticipate pt will require additional follow up physical therapy; discharging at mod I/S level due to cognitive/communication impairments but does not demonstrate significant physical limitation for household and community mobility.  Equipment: No equipment provided  Reasons for discharge: treatment goals met and discharge from hospital  Patient/family agrees with progress made and goals achieved: Yes  PT Discharge Precautions/Restrictions Precautions Precautions: Fall Restrictions Weight Bearing Restrictions: No Pain Pain Assessment Pain Assessment: No/denies pain Pain Score: 0-No pain Faces Pain Scale: No hurt Vision/Perception    Wears glasses all the time Mild R inattention Cognition Overall Cognitive Status: Impaired/Different from baseline Arousal/Alertness: Awake/alert Orientation Level: Oriented X4 Safety/Judgment: Appears intact Sensation Sensation Light Touch: Appears Intact Proprioception: Appears Intact Additional Comments: difficult to formally assess due to aphasia, however functionally appears intact throughout Coordination Gross Motor Movements are Fluid and Coordinated: Yes Heel Shin Test: [facility] Motor  Motor Motor: Hemiplegia Motor - Discharge Observations: mild R extremity  hemiparesis, improved over evaluation to near-baseline  Mobility Bed Mobility Bed Mobility: Sit to Supine;Supine to Sit Supine to Sit: 6: Modified independent (Device/Increase time) Sit to Supine: 6: Modified independent (Device/Increase time) Transfers Transfers: Yes Stand Pivot Transfers: 6: Modified independent (Device/Increase time) Locomotion  Ambulation Ambulation: Yes Ambulation/Gait Assistance: 6: Modified independent (Device/Increase time) Ambulation Distance (Feet): 200 Feet Assistive device: None Gait Gait: Yes Gait Pattern: Within Functional Limits Gait velocity: WFL for age/gender norms High Level Ambulation High Level Ambulation: Side stepping;Head turns;Backwards walking;Direction changes Side Stepping: WFL, performed mod I Backwards Walking: WFL, performed mod I Direction Changes: WFL, performed mod I  Head Turns: decreased speed but no LOB or unsteadiness noted Stairs / Additional Locomotion Stairs: Yes Stairs Assistance: 5: Supervision Stairs Assistance Details: Verbal cues for precautions/safety Stair Management Technique: One rail Right;Alternating pattern;Forwards Number of Stairs: 12 Height of Stairs: 6 Ramp: 5: Supervision Curb: 5: Supervision Wheelchair Mobility Wheelchair Mobility: No  Trunk/Postural Assessment  Cervical Assessment Cervical Assessment: Within Functional Limits Thoracic Assessment Thoracic Assessment: Within Functional Limits Lumbar Assessment Lumbar Assessment: Within Functional Limits Postural Control Postural Control: Within Functional Limits  Balance Balance Balance Assessed: Yes Standardized Balance Assessment Standardized Balance Assessment: Berg Balance Test;Dynamic Gait Index Berg Balance Test Sit to Stand: Able to stand without using hands and stabilize independently Standing Unsupported: Able to stand safely 2 minutes Sitting with Back Unsupported but Feet Supported on Floor or Stool: Able to sit safely and  securely 2 minutes Stand to Sit: Sits safely with minimal use of hands Transfers: Able to transfer safely, minor use of hands Standing Unsupported with Eyes Closed: Able to stand 10 seconds safely Standing Ubsupported with Feet Together: Able to place feet together independently and stand 1 minute safely From Standing, Reach Forward with Outstretched Arm: Can reach confidently >25 cm (10") From Standing Position, Pick up Object from Floor: Able to pick up shoe safely and easily From Standing Position, Turn  to Look Behind Over each Shoulder: Looks behind from both sides and weight shifts well Turn 360 Degrees: Able to turn 360 degrees safely in 4 seconds or less Standing Unsupported, Alternately Place Feet on Step/Stool: Able to stand independently and safely and complete 8 steps in 20 seconds Standing Unsupported, One Foot in Front: Able to place foot tandem independently and hold 30 seconds Standing on One Leg: Able to lift leg independently and hold > 10 seconds Total Score: 56 Dynamic Gait Index Level Surface: Normal Change in Gait Speed: Normal Gait with Horizontal Head Turns: Normal Gait with Vertical Head Turns: Normal Gait and Pivot Turn: Mild Impairment Step Over Obstacle: Normal Step Around Obstacles: Normal Steps: Normal Total Score: 23 Static Sitting Balance Static Sitting - Balance Support: No upper extremity supported;Feet supported Static Sitting - Level of Assistance: 7: Independent Dynamic Sitting Balance Dynamic Sitting - Balance Support: Feet supported;During functional activity Dynamic Sitting - Level of Assistance: 6: Modified independent (Device/Increase time) Dynamic Sitting - Balance Activities: Lateral lean/weight shifting;Reaching for weighted objects;Reaching for objects;Forward lean/weight shifting;Reaching across midline Static Standing Balance Static Standing - Balance Support: No upper extremity supported Static Standing - Level of Assistance: 6: Modified  independent (Device/Increase time) Static Stance: On foam Static Stance: Eyes Closed: 30 sec mod I Static Stance: on Foam: 30 sec S Dynamic Standing Balance Dynamic Standing - Balance Support: No upper extremity supported;During functional activity Dynamic Standing - Level of Assistance: 6: Modified independent (Device/Increase time) Dynamic Standing - Balance Activities: Reaching for objects;Reaching across midline Extremity Assessment  RUE Assessment RUE Assessment: Within Functional Limits LUE Assessment LUE Assessment: Within Functional Limits RLE Assessment RLE Assessment: Within Functional Limits LLE Assessment LLE Assessment: Within Functional Limits  Skilled Therapeutic Intervention: Pt received seated in bed with no c/o pain and agreeable to treatment. Gait throughout session x4 trials of 175-200' with mod I, including navigation to gym, room with no additional cues needed. Dynamic gait training with basketball catching/throwing while performing side stepping, grapevine walking with close S. Dynamic gait in gym with number identification with min verbal cues to locate numbered targets on floor and correctly name. Assessed all mobility as described above with mod I overall, S on stairs and community mobility due to R inattention and impulsivity. Performed floor transfer and car transfer with mod I. Nustep x10 min with BUE/BLE for strengthening and endurance on level 5. Returned to room mod I, and performed bathroom transfer mod I. Remained seated in bed with alarm intact and wife present at completion of session. Educated wife on pt's current status and no need for additional PT follow up as most of his impairments are communication/cognition. Recommended 24/7 S for safety, and S on stairs and with community mobility; wife agreeable.  See Function Navigator for Current Functional Status.  Gary Barker 05/07/2015, 12:29 PM

## 2015-05-07 NOTE — Plan of Care (Signed)
Problem: RH Expression Communication Goal: LTG Patient will increase word finding of common (SLP) LTG: Patient will increase word finding of common objects/daily info/abstract thoughts with cues using compensatory strategies (SLP).  Outcome: Not Met (add Reason) Mod-max assist for word finding of basic objects

## 2015-05-07 NOTE — Plan of Care (Signed)
Problem: RH BOWEL ELIMINATION Goal: RH STG MANAGE BOWEL W/MEDICATION W/ASSISTANCE STG Manage Bowel with Medication with min Assistance.  Outcome: Not Progressing No bowel movement since January 22. Pt refusing suppository

## 2015-05-07 NOTE — Progress Notes (Signed)
Subjective/Complaints: Remains aphasic both receptive and expressive, smiles and says ok to questions Wife at bedside, states he slept well ROS-  limited due to language  Objective: Vital Signs: Blood pressure 147/83, pulse 70, temperature 97.7 F (36.5 C), temperature source Oral, resp. rate 18, height _0  (1.702 m), weight 61.644 kg (135 lb 14.4 oz), SpO2 98 %. No results found. No results found for this or any previous visit (from the past 72 hour(s)).   HEENT: normal Cardio: RRR and no murmurs Resp: CTA B/L and Unlabored GI: BS positive and not tender Extremity:  No Edema Skin:   Intact Neuro: Alert/Oriented and Aphasic and receptive, 5/5 in BUE and BLE Musc/Skel:  Normal Gen NAD   Assessment/Plan: 1. 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 Physiatrist is providing close team supervision and 24 hour management of active medical problems listed below. Physiatrist and rehab team continue to assess barriers to discharge/monitor patient progress toward functional and medical goals. FIM: Function - Bathing Position: Shower Body parts bathed by patient: Right arm, Left arm, Chest, Abdomen, Front perineal area, Buttocks, Right upper leg, Left upper leg, Right lower leg, Left lower leg, Back Assist Level: Supervision or verbal cues Set up : To obtain items, To adjust water temperature  Function- Upper Body Dressing/Undressing What is the patient wearing?: Pull over shirt/dress Pull over shirt/dress - Perfomed by patient: Thread/unthread right sleeve, Thread/unthread left sleeve, Put head through opening, Pull shirt over trunk Button up shirt - Perfomed by patient: Thread/unthread right sleeve, Thread/unthread left sleeve, Pull shirt around back,  Button/unbutton shirt Assist Level: Supervision or verbal cues Function - Lower Body Dressing/Undressing What is the patient wearing?: Pants, Shoes, Socks, Underwear Position: Sitting EOB Underwear - Performed by patient: Thread/unthread right underwear leg, Thread/unthread left underwear leg, Pull underwear up/down Pants- Performed by patient: Thread/unthread right pants leg, Thread/unthread left pants leg, Pull pants up/down, Fasten/unfasten pants Socks - Performed by patient: Don/doff right sock, Don/doff left sock Shoes - Performed by patient: Don/doff right shoe, Don/doff left shoe, Fasten right, Fasten left Assist for footwear: Setup Assist for lower body dressing: Supervision or verbal cues  Function - Toileting Toileting steps completed by patient: Adjust clothing prior to toileting, Performs perineal hygiene, Adjust clothing after toileting Toileting Assistive Devices: Grab bar or rail Assist level: Supervision or verbal cues  Function Midwife transfer assistive device: Grab bar Assist level to toilet: Supervision or verbal cues Assist level from toilet: Supervision or verbal cues  Function - Chair/bed transfer Chair/bed transfer method: Ambulatory Chair/bed transfer assist level: Supervision or verbal cues Chair/bed transfer details: Verbal cues for precautions/safety  Function - Locomotion: Wheelchair Will patient use wheelchair at discharge?: No (pt ambulatory) Function - Locomotion: Ambulation Assistive device: No device Max distance: 175 Assist level: Supervision or verbal cues Assist level: Supervision or verbal cues Assist level: Supervision or verbal cues Assist level: Supervision or verbal cues Assist level: Touching or steadying assistance (Pt > 75%)  Function - Comprehension Comprehension: Auditory Comprehension assist level: Understands basic less than 25% of the time/ requires cueing >75% of the time  Function - Expression Expression:  Verbal Expression assist level: Expresses basic 25 - 49% of the time/requires cueing 50 - 75% of the time. Uses single words/gestures.  Function - Social Interaction Social Interaction assist level: Interacts appropriately  75 - 89% of the time - Needs redirection for appropriate language or to initiate interaction.  Function - Problem Solving Problem solving assist level: Solves basic 25 - 49% of the time - needs direction more than half the time to initiate, plan or complete simple activities  Function - Memory Memory assist level: Recognizes or recalls 25 - 49% of the time/requires cueing 50 - 75% of the time Patient normally able to recall (first 3 days only): Staff names and faces, That he or she is in a hospital   Medical Problem List and Plan: 1.   Gait disorder with decreased balance, mixed transcortical aphasia and apraxia secondary to Left posterior MCA distribution infarct- EC ASA 321m  -Team conference today please see physician documentation under team conference tab, met with team face-to-face to discuss problems,progress, and goals. Formulized individual treatment plan based on medical history, underlying problem and comorbidities.  -improved expressive language 2.  DVT Prophylaxis/Anticoagulation: Pharmaceutical: Lovenox 3. Pain Management: post CVA headache cont Topamax 227mQhs 4. Mood: No signs of distress noted but patient with significant aphasia as well as lack of awareness of deficits. LCSW to follow along for evaluation and support as appropriate.   5. Neuropsych: This patient is not capable of making decisions on his own behalf. 6. Skin/Wound Care: Routine pressure relief measures.   7. Fluids/Electrolytes/Nutrition: Monitor I/O.  8. HTN: Monitor BP tid with permissive HTN to allow for adequate perfusion.  Lisinopril 2.5 mg qhs, 147/83 9. Dyslipidemia: On lipitor 10. Constipation:  Miralax BID, prn Fleets,senna S just increased, discussed with RN   LOS (Days)  6 A FACE TO FACE EVALUATION WAS PERFORMED  Gary Barker E 05/07/2015, 7:07 AM

## 2015-05-07 NOTE — Progress Notes (Signed)
Nutrition Brief Note  RD contacted for a revisit as pt's wife has questions regarding diet for home. Translator was not present during time of visit, however pt and wife were able to understand. Wife had questions regarding amount of carbohydrates allotted at meals an d if desserts and sweets were appropriate. All questions were appropriately answered. Pt and wife were additionally given a new handout "General, Healthful Nutrition Therapy" from the Academy of Nutrition and Dietetics Manual for additional information, however main focus was on a diabetic diet. Pt and wife expressed understanding.   Roslyn Smiling, MS, RD, LDN Pager # [phone] After hours/ weekend pager # [phone]

## 2015-05-07 NOTE — Progress Notes (Signed)
Speech Language Pathology Discharge Summary  Patient Details  Name: Gary Barker MRN: [id number] Date of Birth: March 03, 1966  Today's Date: 05/07/2015 SLP Individual Time: 0800-0900; 1275-1700 SLP Individual Time Calculation (min): 60 min; 23 min   Skilled Therapeutic Interventions:    Session 1: Pt was seen for skilled ST targeting goals for communication and family education.  Pt's wife was present and and interpreter was utilized during today's therapy session to allow her ample opportunity to ask questions and facilitate better comprehension of communication strategies.  SLP provided skilled education regarding use of verbal, visual, and gestural cues in addition to short, simple phrases when communicating with pt to facilitate improved comprehension of basic information as pt continues to demonstrate impairments for answering yes/no questions and following 1-step commands.  SLP also provided skilled education regarding communication strategies to maximize functional expression of needs and wants, including providing semantic and phonemic cues for naming of familiar objects.  SLP demonstrated cuing techinques during a functional naming task of basic, familiar objects.  Pt was able to name objects from [facility] box with overall mod-max assist verbal and visual cues for 90% accuracy.  Pt was also stimulable for written expression at the word level using a copy and repeat model for ~90% accuracy with mod assist verbal and visual cues.  With visual feedback, pt was able to recognize and correct written errors with mod assist.  All wife's and pt's questions were answered to their satisfaction at this time. Session 2: Pt was seen for skilled ST targeting goals for communication and cognition.  Pt asleep upon arrival and appeared lethargic once awakened.  Pt was able to follow 1-step direction when ambulating to treatment room with supervision but required min assist tactile cues for awareness of obstacles on the  right.  SLP attempted writing task that was initiation yesterday during AM therapy session; however, pt too lethargic and unable to complete task.  When asked if he was in pain, pt was echolalic and kept repeating what SLP said despite repetition for verification.  Pt did not appear in any overt pain and vital signs were WNL.  RN made aware. Pt ambulated back to treatment room with supervision cues for route recall.  Pt left in bed with bed alarm set.  Pt returned demonstration for use of call bell with mod assist visual cues.     Patient has met 4 of 5 long term goals.  Patient to discharge at overall Mod level.  Reasons goals not met: pt requires mod-max assist cues for word finding of familiar objects   Clinical Impression/Discharge Summary:  Pt made functional gains and is discharging at an overall mod assist level for basic cognitive-linguistic tasks.  Pt continues to present with a moderate transcortical aphasia with deficits in yes/no responses, following 1-step commands, naming of familiar objects, and fluctuating awareness of verbal errors.  Verbal expression remains limited by perseverative and echolalic responses; however, pt has demonstrated improvements in impulsivity and is able to contain his responses to the word level to improve accuracy with mod assist.  Pt is discharging home with 24/7 supervision and recommendations for [place] follow up.  All family education is complete at this time.    Care Partner:  Caregiver Able to Provide Assistance: Yes  Type of Caregiver Assistance: Physical;Cognitive  Recommendation:  24 hour supervision/assistance;Home Health SLP  Rationale for SLP Follow Up: Maximize functional communication;Reduce caregiver burden;Maximize cognitive function and independence   Equipment: none recommended by SLP    Reasons  for discharge: Discharged from hospital   Patient/Family Agrees with Progress Made and Goals Achieved: Yes   Function:  Eating Eating                  Cognition Comprehension Comprehension assist level: Understands basic 25 - 49% of the time/ requires cueing 50 - 75% of the time  Expression   Expression assist level: Expresses basic 25 - 49% of the time/requires cueing 50 - 75% of the time. Uses single words/gestures.  Social Interaction Social Interaction assist level: Interacts appropriately 75 - 89% of the time - Needs redirection for appropriate language or to initiate interaction.  Problem Solving Problem solving assist level: Solves basic 50 - 74% of the time/requires cueing 25 - 49% of the time  Memory Memory assist level: Recognizes or recalls 50 - 74% of the time/requires cueing 25 - 49% of the time   Emilio Math 05/07/2015, 12:45 PM

## 2015-05-07 NOTE — Hospital Discharge Follow-Up (Signed)
Request received from [facility], SW to assess the patient for the [facility] ( TCC) at the [facility].   The medical record was reviewed and it was determined that the patient did not meet the criteria for TCC. This CM met with the patient and his wife to discuss medical follow up after discharge.  Explained the services provided at the [facility] including primary care, social work, financial counseling and pharmacy assistance.  Both the patient and his wife were agreeable to scheduling a follow up appointment at [facility].  An appointment was scheduled for 05/09/15 @ 0930 and the information was placed on the AVS. His wife said that she would be able to drive him to his clinic appointment.  His wife confirmed that he will have 24/7 supervision when he is home.  She said that she will not be working when he is discharged and she will eventually look for a 2nd shift job.  His wife also noted that she met with financial assistance/medicaid at the hospital this morning and completed a medicaid application. When this CM was with the patient, Jasmin, with financial assistance provided his wife with an application for food stamps.    His wife , Berenice Bouton provided her contact # [phone] and the # of her  38 year old son,Gio Toney Rakes # [phone].  Update provided to J. Prevatt, SW. A referral for home speech therapy is being considered if the patient is eligible.

## 2015-05-07 NOTE — Progress Notes (Signed)
Recreational Therapy Session Note  Patient Details  Name: Gary Barker MRN: [id number] Date of Birth: 1965-05-29 Today's Date: 05/07/2015  Order received and chart reviewed. Per treatment team, pt with anticipated discharge home tomorrow.  Full eval deferred. Dariela Stoker 05/07/2015, 5:00 PM

## 2015-05-07 NOTE — Progress Notes (Signed)
Occupational Therapy Session Note  Patient Details  Name: Gary Barker MRN: [id number] Date of Birth: [birth year]  Today's Date: 05/07/2015 OT Concurrent Time: 1100-1200 OT Concurrent Time Calculation (min): 60 min   Short Term Goals: Week 1:  OT Short Term Goal 1 (Week 1): STG = LTGs due to ELOS  Skilled Therapeutic Interventions/Progress Updates:    Completed ADL retraining at overall Mod I level.  Pt completed bathing and dressing in standing in room shower at overall Mod I level, with pt's wife present in room - as recommending Mod I with basic self-care tasks in an overall supervised environment secondary to higher level cognitive deficits and decreased safety awareness.  Reiterated recommendations for supervision with cooking tasks secondary to impaired awareness and sequencing with cooking task, encouraged wife to perform higher level ADLs, money management, and medication management with both reporting understanding.  Engaged in therapeutic activity in standing with pt teaching therapist a card game, requiring max question cues for directions.  Pt's wife present and provided assist with explaining the rules.  Pt able to play game from memory and add up score of his hand at the end.  Noted pt with inconsistencies with numbers stated out loud compared to written numbers.  With pt writing 85 and stating "6, 7", with the written number correct.  Pt returned to room and left seated EOB with wife present.  Therapy Documentation Precautions:  Precautions Precautions: Fall Restrictions Weight Bearing Restrictions: No General:   Vital Signs: Therapy Vitals Temp: 97.6 F (36.4 C) Temp Source: Oral Pulse Rate: 67 Resp: 18 BP: 98/66 mmHg Patient Position (if appropriate): Lying Oxygen Therapy SpO2: 97 % O2 Device: Not Delivered Pain: Pain Assessment Pain Assessment: No/denies pain Pain Score: 0-No pain  See Function Navigator for Current Functional Status.   Therapy/Group:  Concurrent session  Rosalio Loud 05/07/2015, 3:30 PM

## 2015-05-08 ENCOUNTER — Encounter (HOSPITAL_COMMUNITY): Payer: Self-pay

## 2015-05-08 DIAGNOSIS — R51 Headache: Secondary | ICD-10-CM

## 2015-05-08 DIAGNOSIS — K5900 Constipation, unspecified: Secondary | ICD-10-CM | POA: Diagnosis present

## 2015-05-08 DIAGNOSIS — R519 Headache, unspecified: Secondary | ICD-10-CM | POA: Diagnosis present

## 2015-05-08 LAB — CREATININE, SERUM
CREATININE: 0.72 mg/dL (ref 0.61–1.24)
GFR calc Af Amer: >60 mL/min (ref >60)

## 2015-05-08 MED ORDER — ATORVASTATIN CALCIUM 40 MG PO TABS: 40.0000 mg | ORAL_TABLET | Freq: Every day | ORAL | Status: DC

## 2015-05-08 MED ORDER — LISINOPRIL 2.5 MG PO TABS: 2.5000 mg | ORAL_TABLET | Freq: Every day | ORAL | Status: DC

## 2015-05-08 MED ORDER — SENNOSIDES-DOCUSATE SODIUM 8.6-50 MG PO TABS
2.0000 | ORAL_TABLET | Freq: Two times a day (BID) | ORAL | Status: DC
Start: 2015-05-08 — End: 2015-05-09

## 2015-05-08 MED ORDER — ASPIRIN 325 MG PO TABS: 325.0000 mg | ORAL_TABLET | Freq: Every day | ORAL | Status: DC

## 2015-05-08 MED ORDER — TRAMADOL HCL 50 MG PO TABS: 50.0000 mg | ORAL_TABLET | Freq: Four times a day (QID) | ORAL | Status: DC

## 2015-05-08 MED ORDER — TOPIRAMATE 25 MG PO TABS: 25.0000 mg | ORAL_TABLET | Freq: Every day | ORAL | Status: DC

## 2015-05-08 MED ORDER — NICOTINE 14 MG/24HR TD PT24
14.0000 mg | MEDICATED_PATCH | Freq: Every day | TRANSDERMAL | Status: DC
Start: 2015-05-08 — End: 2015-08-08

## 2015-05-08 MED ORDER — THIAMINE HCL 100 MG PO TABS: 100.0000 mg | ORAL_TABLET | Freq: Every day | ORAL | Status: DC

## 2015-05-08 MED ORDER — POLYETHYLENE GLYCOL 3350 17 G PO PACK: 17.0000 g | PACK | Freq: Two times a day (BID) | ORAL | Status: DC

## 2015-05-08 MED ORDER — PROSIGHT PO TABS: 1.0000 | ORAL_TABLET | Freq: Every day | ORAL | Status: AC

## 2015-05-08 MED ORDER — SENNOSIDES-DOCUSATE SODIUM 8.6-50 MG PO TABS: 2.0000 | ORAL_TABLET | Freq: Two times a day (BID) | ORAL | Status: DC

## 2015-05-08 MED ORDER — NICOTINE 14 MG/24HR TD PT24
14.0000 mg | MEDICATED_PATCH | Freq: Every day | TRANSDERMAL | Status: DC
Start: 2015-05-08 — End: 2015-05-08

## 2015-05-08 MED ORDER — ASPIRIN 325 MG PO TABS
325.0000 mg | ORAL_TABLET | Freq: Every day | ORAL | Status: DC
Start: 2015-05-08 — End: 2015-05-09

## 2015-05-08 MED FILL — POLYETHYLENE GLYCOL 3350 PO: 8 days supply | Qty: 255 | Fill #0

## 2015-05-08 MED FILL — LISINOPRIL 2.5 MG TABLET: 2.5 | 30 days supply | Qty: 30 | Fill #0

## 2015-05-08 MED FILL — ?ATORVASTATIN 40MG TABLET: 40 | 30 days supply | Qty: 30 | Fill #0

## 2015-05-08 NOTE — Progress Notes (Signed)
Subjective/Complaints: Smiling when discussed D/C ROS-  limited due to language  Objective: Vital Signs: Blood pressure 122/77, pulse 77, temperature 98.3 F (36.8 C), temperature source Oral, resp. rate 18, height  (1.702 m), weight 62.5 kg (137 lb 12.6 oz), SpO2 99 %. No results found. No results found for this or any previous visit (from the past 72 hour(s)).   HEENT: normal Cardio: RRR and no murmurs Resp: CTA B/L and Unlabored GI: BS positive and not tender Extremity:  No Edema Skin:   Intact Neuro: Alert/Oriented and Aphasic and receptive, 5/5 in BUE and BLE Musc/Skel:  Normal Gen NAD   Assessment/Plan: 1. Functional deficits secondary to Gait disorder with decreased balance, mixed transcortical aphasia or apraxia secondary to Left posterior MCA distribution infarct  Stable for D/C today F/u PCP in 1-2 weeks F/u PM&R 3 weeks See D/C summary See D/C instructions FIM: Function - Bathing Position: Shower Body parts bathed by patient: Right arm, Left arm, Chest, Abdomen, Front perineal area, Buttocks, Right upper leg, Left upper leg, Right lower leg, Left lower leg, Back Assist Level: More than reasonable time Set up : To obtain items, To adjust water temperature  Function- Upper Body Dressing/Undressing What is the patient wearing?: Button up shirt Pull over shirt/dress - Perfomed by patient: Thread/unthread right sleeve, Thread/unthread left sleeve, Put head through opening, Pull shirt over trunk Button up shirt - Perfomed by patient: Thread/unthread right sleeve, Thread/unthread left sleeve, Pull shirt around back, Button/unbutton shirt Assist Level: More than reasonable time Function - Lower Body Dressing/Undressing What is the patient wearing?: Pants, Shoes, Socks, Underwear Position:  (standing in bathroom with pants and underwear, EOB for socks and shoes) Underwear - Performed by patient: Thread/unthread right underwear leg, Thread/unthread left underwear  leg, Pull underwear up/down Pants- Performed by patient: Thread/unthread right pants leg, Thread/unthread left pants leg, Pull pants up/down, Fasten/unfasten pants Socks - Performed by patient: Don/doff right sock, Don/doff left sock Shoes - Performed by patient: Don/doff right shoe, Don/doff left shoe, Fasten right, Fasten left Assist for footwear: Independent Assist for lower body dressing: More than reasonable time  Function - Toileting Toileting steps completed by patient: Adjust clothing prior to toileting, Performs perineal hygiene, Adjust clothing after toileting Toileting Assistive Devices: Grab bar or rail Assist level: Supervision or verbal cues  Function Programmer, multimedia transfer assistive device: Grab bar Assist level to toilet: No Help, no cues, assistive device, takes more than a reasonable amount of time Assist level from toilet: No Help, no cues, assistive device, takes more than a reasonable amount of time  Function - Chair/bed transfer Chair/bed transfer method: Ambulatory Chair/bed transfer assist level: No Help, no cues, assistive device, takes more than a reasonable amount of time Chair/bed transfer details: Verbal cues for precautions/safety  Function - Locomotion: Wheelchair Will patient use wheelchair at discharge?: No Function - Locomotion: Ambulation Assistive device: No device Max distance: 175 Assist level: No help, No cues, assistive device, takes more than a reasonable amount of time Assist level: No help, No cues, assistive device, takes more than a reasonable amount of time Assist level: No help, No cues, assistive device, takes more than a reasonable amount of time Assist level: No help, No cues, assistive device, takes more than a reasonable amount of time Assist level: Supervision or verbal cues  Function - Comprehension Comprehension: Auditory Comprehension assist level: Understands basic 25 - 49% of the time/ requires cueing 50 - 75% of  the time  Function -  Expression Expression: Verbal Expression assist level: Expresses basic 25 - 49% of the time/requires cueing 50 - 75% of the time. Uses single words/gestures.  Function - Social Interaction Social Interaction assist level: Interacts appropriately 75 - 89% of the time - Needs redirection for appropriate language or to initiate interaction.  Function - Problem Solving Problem solving assist level: Solves basic 50 - 74% of the time/requires cueing 25 - 49% of the time  Function - Memory Memory assist level: Recognizes or recalls 50 - 74% of the time/requires cueing 25 - 49% of the time Patient normally able to recall (first 3 days only): Staff names and faces, That he or she is in a hospital   Medical Problem List and Plan: 1.   Gait disorder with decreased balance, mixed transcortical aphasia and apraxia secondary to Left posterior MCA distribution infarct- EC ASA  . Formulized individual treatment plan based on medical history, underlying problem and comorbidities.  -improved expressive language 2.  DVT Prophylaxis/Anticoagulation: Pharmaceutical: Lovenox 3. Pain Management: post CVA headache cont Topamax  Qhs 4. Mood: No signs of distress noted but patient with significant aphasia as well as lack of awareness of deficits. LCSW to follow along for evaluation and support as appropriate.   5. Neuropsych: This patient is not capable of making decisions on his own behalf. 6. Skin/Wound Care: Routine pressure relief measures.   7. Fluids/Electrolytes/Nutrition: Monitor I/O.  8. HTN: Monitor BP tid with permissive HTN to allow for adequate perfusion.  Lisinopril 2.5 mg qhs,  9. Dyslipidemia: On lipitor 10. Constipation:  Miralax BID, prn Fleets,s   LOS (Days) 7 A FACE TO FACE EVALUATION WAS PERFORMED  KIRSTEINS,ANDREW E 05/08/2015, 7:00 AM

## 2015-05-08 NOTE — Patient Care Conference (Signed)
Inpatient RehabilitationTeam Conference and Plan of Care Update Date: 05/08/2015   Time: 9:30 AM    Patient Name: Gary Barker      Medical Record Number: [id number]  Date of Birth: 03-09-1966 Sex: Male         Room/Bed: 4M01C/4M01C-01 Payor Info: Payor: MEDICAID POTENTIAL / Plan: MEDICAID POTENTIAL / Product Type: *No Product type* /    Admitting Diagnosis: L MCA INFARCT  Admit Date/Time:  05/01/2015  5:52 PM Admission Comments: No comment available   Primary Diagnosis:  Acute ischemic left MCA stroke (HCC) Principal Problem: Acute ischemic left MCA stroke [facility])  Patient Active Problem List   Diagnosis Date Noted  . Constipation 05/08/2015  . New onset of headaches due to stroke 05/08/2015  . Acute ischemic left MCA stroke (HCC) 05/01/2015  . Gait disturbance, post-stroke   . Gout, chronic   . Dysphagia due to recent stroke   . Aphasia due to recent cerebral infarction   . Altered mental status   . Alcoholism (HCC)   . Essential hypertension   . Tobacco abuse   . CVA (cerebral infarction) 04/26/2015  . Stroke (cerebrum) (HCC) 04/26/2015  . Cerebral infarction due to unspecified mechanism   . Alcohol use disorder (HCC)   . Tobacco use disorder     Expected Discharge Date: Expected Discharge Date: 05/08/15  Team Members Present: Physician leading conference: Dr. Claudette Laws Social Worker Present: Staci Acosta, LCSW Nurse Present: Ronny Bacon, RN;Deborah Lambert Mody, RN PT Present: Edman Circle, PT;Elizabeth Tygielski, Nita Sickle, PT OT Present: Rosalio Loud, OT SLP Present: Jackalyn Lombard, SLP PPS Coordinator present : Tora Duck, RN, CRRN     Current Status/Progress Goal Weekly Team Focus  Medical   no pain issues, remains aphasic  home Mod I  D/C planning   Bowel/Bladder   continent of bowel and bladder. LBM 05/04/15. Suppository offered. Refusing.   Remain continent of bowel and bladder  Encourage suppository   Swallow/Nutrition/ Hydration              ADL's   Mod I with basic self-care tasks in a supervised environment, supervision simple meal prep  Mod I with basic self-care tasks in a supervised environment, supervision simple meal prep  pt and family education on impaired awareness of deficits and safety awareness   Mobility   mod I overall, S community ambulation and stairs  mod I sitting balance, bed mobility, transfers, ambulation in home environment with LRAD, S community ambulation and stairs  education with family in preparation for d/c home with 24/7   Communication   moderate expressive and receptive aphasia   mod assist   education complete, pt on track for discharge today    Safety/Cognition/ Behavioral Observations  impulsive, poor safety awareness, decreased task organization   min-mod assist   education complete, pt on track for discharge today    Pain   Occasional headache. Scheduled tramadol.   <3   Assess pain frequently.    Skin   CDI  Remain free of breakdown and infection  Assess skin q shift and prn     Rehab Goals Patient on target to meet rehab goals: Yes Rehab Goals Revised: none *See Care Plan and progress notes for long and short-term goals.  Barriers to Discharge: none    Possible Resolutions to Barriers:  D/C home    Discharge Planning/Teaching Needs:  Pt to go home with his wife to provide 24/7 supervision.  Pt's wife has been present for family education and  spanish interpreter was provided so that she could ask any questions and feel comfortable and confident that she understands pt's care and current condition.   Team Discussion:  Pt with left MCA with aphasia and cognitive deficits being the big obstacles for pt.  ST stated that pt's impulsivity has improved.  His greatest challenge will be how he makes his needs and wants known to his caregivers.  ST feels that with more therapy, pt will be able to use verbal cues with written materials.  Pt will need an adult with him 24/7.  All team members  have emphasized this with pt's wife.  Pt is mod I to I with PT and his awareness is getting better.  Pt is mod I for basic ADLs with OT, but higher level ADLs need 24/7 supervision.  Pt is medically stable for d/c today with HH speech to f/u with pt at home.  Revisions to Treatment Plan:  none   Continued Need for Acute Rehabilitation Level of Care: The patient requires daily medical management by a physician with specialized training in physical medicine and rehabilitation for the following conditions: Daily direction of a multidisciplinary physical rehabilitation program to ensure safe treatment while eliciting the highest outcome that is of practical value to the patient.: Yes Daily analysis of laboratory values and/or radiology reports with any subsequent need for medication adjustment of medical intervention for : Neurological problems  Avyn Aden, [place] 05/08/2015, 1:51 PM

## 2015-05-08 NOTE — Progress Notes (Signed)
Pt discharged home w/wife. Discharge instructions provided via interpreter by Marissa Nestle, PA. Pt also educated by cardiology area regarding loop recorder with interpreter services at bedside. All questions answered, pt verbalized understanding. Pt escorted off unit in w/c with personal belonging by [place] Ingram, NT.

## 2015-05-08 NOTE — Progress Notes (Signed)
Social Work Discharge Note  The overall goal for the admission was met for:   Discharge location: Yes - home with his wife and family  Length of Stay: Yes - 7 days  Discharge activity level: Yes - 24/7 supervision  Home/community participation: Yes  Services provided included: MD, RD, PT, OT, SLP, RN, Pharmacy, Neuropsych and SW  Financial Services: Other: Pt is currently not insured, but completed Medicaid application while he was on Inpatient Rehab.  Follow-up services arranged: Home Health: Speech Therapy from [place] and Patient/Family has no preference for HH/DME agencies  Comments (or additional information): Pt to be at home with his wife 24/7.  If she needs to leave, she has been told that pt should go with her or she should have another adult to be with him.  Pt cannot care for 50 y/o dtr alone.  Pt's family to come visit and help as they can.  Pt will f/u with [facility] for PCP and medications.  Pt can go today to Center to get medications and f/u appt is tomorrow.  Interpreter was present at d/c to go over d/c instructions with pt's wife.  Neuropsych also met with pt to try to do testing, but he could not.  Pt was provided with their number to f/u at a later time.  CSW also gave pt/wife financial resources and emergency assistance info.  Pt's dtr or wife's son to assist with food stamps application that wife has.  CSW available for questions as needed.  Patient/Family verbalized understanding of follow-up arrangements: Yes  Individual responsible for coordination of the follow-up plan: pt's wife, Zenida.  Extended family to support from out-of-state and will come for visits for a week at a time to help pt's wife.  Confirmed correct DME delivered: Trey Sailors 05/08/2015    Aylan Bayona, Silvestre Mesi

## 2015-05-08 NOTE — Discharge Instructions (Signed)
Inpatient Rehab Discharge Instructions  Gary Barker Discharge date and time: No discharge date for patient encounter.   Activities/Precautions/ Functional Status: Activity: no lifting, driving, or strenuous exercise  till cleared by MD Diet: cardiac diet and diabetic diet Wound Care: none needed   Functional status:  ___ No restrictions     ___ Walk up steps independently _X__ 24/7 supervision/assistance   ___ Walk up steps with assistance ___ Intermittent supervision/assistance  ___ Bathe/dress independently ___ Walk with walker     ___ Bathe/dress with assistance ___ Walk Independently    ___ Shower independently ___ Walk with assistance    ___ Shower with assistance _X__ No alcohol     ___ Return to work/school ________   COMMUNITY REFERRALS UPON DISCHARGE:   Home Health:   Speech Therapy    Agency:  [facility] Phone:  [phone] Medical Equipment/Items Ordered:  None needed   GENERAL COMMUNITY RESOURCES FOR PATIENT/FAMILY: Support Groups:  [facility] Stroke Support Group                              Meets 2nd Thursday of the month from 3-4PM                              In the dayroom at [facility] Neuropsychologist:  If in 6 months your language improves and you want to see Dr. Leavy Cella again for cognitive testing, here is her contact information.                                  Dr. Leavy Cella, PsyD                                  [facility], [facility]                                  [address]                                  [place], [place] [address]                                  [phone]  Special Instructions:    STROKE/TIA DISCHARGE INSTRUCTIONS SMOKING Cigarette smoking nearly doubles your risk of having a stroke & is the single most alterable risk factor  If you smoke or have smoked in the last 12 months, you are advised to quit smoking for your health.  Most of the excess  cardiovascular risk related to smoking disappears within a year of stopping.  Ask you doctor about anti-smoking medications  [place] Quit Line: 1-800-QUIT NOW  Free Smoking Cessation Classes [phone]  CHOLESTEROL Know your levels; limit fat & cholesterol in your diet  Lipid Panel     Component Value Date/Time   CHOL 206* 04/27/2015 0300   TRIG 105 04/27/2015 0300   HDL 83 04/27/2015 0300   CHOLHDL 2.5 04/27/2015 0300   VLDL 21 04/27/2015 0300   LDLCALC 102* 04/27/2015 0300  Many patients benefit from treatment even if their cholesterol is at goal.  Goal: Total Cholesterol (CHOL) less than 160  Goal:  Triglycerides (TRIG) less than 150  Goal:  HDL greater than 40  Goal:  LDL (LDLCALC) less than 100   BLOOD PRESSURE American Stroke Association blood pressure target is less that 120/80 mm/Hg  Your discharge blood pressure is:  BP: 119/77 mmHg  Monitor your blood pressure  Limit your salt and alcohol intake  Many individuals will require more than one medication for high blood pressure  DIABETES (A1c is a blood sugar average for last 3 months) Goal HGBA1c is under 7% (HBGA1c is blood sugar average for last 3 months)  Diabetes: Pre-diabetic.    Lab Results  Component Value Date   HGBA1C 6.5* 04/27/2015     Your HGBA1c can be lowered with medications, healthy diet, and exercise.  Check your blood sugar as directed by your physician  Call your physician if you experience unexplained or low blood sugars.  PHYSICAL ACTIVITY/REHABILITATION Goal is 30 minutes at least 4 days per week  Activity: No driving, Therapies: See above Return to work:  To be decided on follow up appointment  Activity decreases your risk of heart attack and stroke and makes your heart stronger.  It helps control your weight and blood pressure; helps you relax and can improve your mood.  Participate in a regular exercise program.  Talk with your doctor about the best form of exercise for you  (dancing, walking, swimming, cycling).  DIET/WEIGHT Goal is to maintain a healthy weight  Your discharge diet is: Diet Carb Modified Fluid consistency:: Thin; Room service appropriate?: Yes  liquids Your height is:  Height:  (170.2 cm) Your current weight is: Weight: 61.644 kg (135 lb 14.4 oz) Your Body Mass Index (BMI) is:  BMI (Calculated): 21.3  Following the type of diet specifically designed for you will help prevent another stroke.  You are at goal weight   Your goal Body Mass Index (BMI) is 19-24.  Healthy food habits can help reduce 3 risk factors for stroke:  High cholesterol, hypertension, and excess weight.  RESOURCES Stroke/Support Group:  Call [phone]   STROKE EDUCATION PROVIDED/REVIEWED AND GIVEN TO PATIENT Stroke warning signs and symptoms How to activate emergency medical system (call 911). Medications prescribed at discharge. Need for follow-up after discharge. Personal risk factors for stroke. Pneumonia vaccine given:  Flu vaccine given:  My questions have been answered, the writing is legible, and I understand these instructions.  I will adhere to these goals & educational materials that have been provided to me after my discharge from the hospital.      My questions have been answered and I understand these instructions. I will adhere to these goals and the provided educational materials after my discharge from the hospital.  Patient/Caregiver Signature _______________________________ Date __________  Clinician Signature _______________________________________ Date __________  Please bring this form and your medication list with you to all your follow-up doctor's appointments.

## 2015-05-08 NOTE — Discharge Summary (Signed)
Physician Discharge Summary  Patient ID: Gary Barker MRN: [id number] DOB/AGE: 1965-04-14 50 y.o.  Admit date: 05/01/2015 Discharge date: 05/08/2015  Discharge Diagnoses:  Principal Problem:   Acute ischemic left MCA stroke [facility]) Active Problems:   Dysphagia due to recent stroke   Aphasia due to recent cerebral infarction   Gait disturbance, post-stroke   Constipation   New onset of headaches due to stroke   Discharged Condition: Stable     Labs:  Basic Metabolic Panel:  Recent Labs Lab 05/02/15 0600 05/03/15 0505 05/08/15 0632  NA 136  --   --   K 3.9  --   --   CL 105  --   --   CO2 25  --   --   GLUCOSE 111*  --   --   BUN 8  --   --   CREATININE 0.66 0.67 0.72  CALCIUM 9.1  --   --     CBC:  Recent Labs Lab 05/02/15 0600  WBC 7.7  NEUTROABS 5.1  HGB 15.3  HCT 43.9  MCV 93.8  PLT 194    CBG: No results for input(s): GLUCAP in the last 168 hours.  Brief HPI:   Gary Barker is a 50 y.o. male with history of tobacco use, recent gout flare affecting bilateral thumbs and heavy alcohol use who was admitted on 04/26/15 am with right sided weakness and confusion.  MRI/MRA brain done revealing extensive areas of non-hemorrhagic infarct throughout much of L-MCA territory sparing basal ganglia, focal high grade stenosis or filling defect in L-MCA distal M1 segment and approximately 50% mid basilar stenosis. TEE with EF 55-60% and no wall abnormality. No thrombus or PFO seen and loop recorder placed by Dr. Graciela Husbands. Cognitive evaluation shows mixed transcortical aphasia impacting expressive and receptive language with 25% accuracy for basic Y/N questions and ability to follow commands. He continues to have significant cognitive deficits impacting balance, gait and ability to carry out ADL tasks. CIR recommend for follow up therapy.    Hospital Course: Gary Barker was admitted to rehab 05/01/2015 for inpatient therapies to consist of PT, ST and OT at least three hours five  days a week. Past admission physiatrist, therapy team and rehab RN have worked together to provide customized collaborative inpatient rehab. Blood pressures were monitored on bid basis and were noted to be poorly controlled. Low dose lisinopril was added at bedtime with reasonable control. Po intake has been good and he is continent of bowel and bladder. He has had issues with constipation and Senna was increased to bid in addition to miralax bid.  He has made functional gains in transcortical aphasia with ability to follow simple commands but continues to perseverative speech with echolalia. He has made good progress during his rehab stay and is currently at supervision level. He will continue to receive follow up Home Health Speech therapy.    Rehab course: During patient's stay in rehab weekly team conferences were held to monitor patient's progress, set goals and discuss barriers to discharge. At admission, patient required supervision to min/guard assist with mobility and min assist with basic self-care tasks. He had severe cognitive deficits with sever transcortical aphasia with inability to answer basic Y/N questions and severe expressive deficits.  He has had improvement in activity tolerance, balance, postural control, as well as ability to compensate for deficits.   He is able to complete ADL tasks independently. He is ambulating 200 feet at modified independent level without assistive device. He is  able to climb 12 stairs with supervision. Family education was done with wife regarding need for supervision as well as assistance with medication and financial issues. He continues to have  moderate transcortical aphasia with deficits in yes/no responses, ability to consistently follow 1-step commands, naming of familiar objects, and has fluctuating awareness of verbal errors. He continues to have perseverative and echolalic responses; however he is now able to showing decrease in impulsivity and is able  to contain his responses to the word level to improve accuracy with mod assist   Disposition: 01-Home or Self Care    Diet: Heart Healthy.   Special Instructions: 1. No driving. No strenuous activity.  2. Continue to use laxatives for now.        Discharge Instructions    Ambulatory referral to Physical Medicine Rehab    Complete by:  As directed   4 week follow up after stroke            Medication List    TAKE these medications        albuterol-ipratropium 18-103 MCG/ACT inhaler  Commonly known as:  COMBIVENT  Inhale 1-2 puffs into the lungs every 6 (six) hours as needed for wheezing or shortness of breath.     aspirin 325 MG tablet  Take 1 tablet (325 mg total) by mouth daily.     atorvastatin 40 MG tablet  Commonly known as:  LIPITOR  Take 1 tablet (40 mg total) by mouth daily at 6 PM.     lisinopril 2.5 MG tablet  Commonly known as:  PRINIVIL,ZESTRIL  Take 1 tablet (2.5 mg total) by mouth at bedtime.     multivitamin Tabs tablet  Take 1 tablet by mouth daily.     nicotine 14 mg/24hr patch  Commonly known as:  NICODERM CQ - dosed in mg/24 hours  Place 1 patch (14 mg total) onto the skin daily.     polyethylene glycol packet  Commonly known as:  MIRALAX / GLYCOLAX  Take 17 g by mouth 2 (two) times daily.     senna-docusate 8.6-50 MG tablet  Commonly known as:  Senokot-S  Take 2 tablets by mouth 2 (two) times daily.     thiamine 100 MG tablet  Take 1 tablet (100 mg total) by mouth daily.     topiramate 25 MG tablet  Commonly known as:  TOPAMAX  Take 1 tablet (25 mg total) by mouth at bedtime.     traMADol 50 MG tablet  Commonly known as:  ULTRAM  Take 1 tablet (50 mg total) by mouth every 6 (six) hours. For headaches. Can decrease to three times a day in a couple of weeks as headaches improve.       Follow-up Information    Follow up with Erick Colace, MD.   Specialty:  Physical Medicine and Rehabilitation   Contact information:   [address] [place] [address] [place] [address] [phone]       Follow up with Delia Heady, MD.   Specialties:  Neurology, Radiology   Contact information:   [address] [place] [address] [phone]       Follow up with Donato Schultz, MD.   Specialty:  Cardiology   Contact information:   [address] [place] [address] [phone]       Follow up with [facility]. Go on 05/09/2015.   Specialty:  Internal Medicine   Why:  at 9:30am  for a hospital follow up appointment.     Contact information:   [address] [id number] mc [address] [place] [address] [phone]      Signed: Jacquelynn Cree 05/08/2015, 5:16 PM

## 2015-05-08 NOTE — Progress Notes (Signed)
Social Work Patient ID: Gary Barker, male   DOB: [birth year], 50 y.o.   MRN: [id number]   CSW spoke with pt and wife yesterday to update them on team conference discussion.  They are both pleased that pt will be ready for d/c 05-08-15.  CSW had interpreter come to aid with family education for pt's wife.  Speech found this helpful.  CSW also arranged interpreter to come for d/c instructions and neuropsychology visit on day of d/c.  Wife is slightly overwhelmed with all the information for d/c, but feels all of her questions have been answered and she understands what pt needs at home.  CSW also called pt's dtr to update her on pt's condition and d/c, as she has returned to [place].  She was appreciative of the call and has continued to tell pt's wife that they need her to be with pt 24/7 and they will help the family financially and with family members taking turns to come be with pt. CSW to arrange [facility] for speech therapy and will give pt/family other community resources.

## 2015-05-09 ENCOUNTER — Telehealth: Payer: Self-pay

## 2015-05-09 ENCOUNTER — Ambulatory Visit: Payer: Medicaid Other | Attending: Internal Medicine | Admitting: Physician Assistant

## 2015-05-09 ENCOUNTER — Ambulatory Visit: Payer: Self-pay

## 2015-05-09 VITALS — BP 137/87 | HR 77 | Temp 97.8°F | Resp 18 | Ht 66.0 in | Wt 138.0 lb

## 2015-05-09 DIAGNOSIS — I63412 Cerebral infarction due to embolism of left middle cerebral artery: Secondary | ICD-10-CM

## 2015-05-09 MED ORDER — SENNOSIDES-DOCUSATE SODIUM 8.6-50 MG PO TABS: 2.0000 | ORAL_TABLET | Freq: Two times a day (BID) | ORAL | Status: DC

## 2015-05-09 MED ORDER — ASPIRIN 325 MG PO TABS: 325.0000 mg | ORAL_TABLET | Freq: Every day | ORAL | Status: AC

## 2015-05-09 MED ORDER — TOPIRAMATE 25 MG PO TABS: 25.0000 mg | ORAL_TABLET | Freq: Every day | ORAL | Status: DC

## 2015-05-09 MED ORDER — ATORVASTATIN CALCIUM 40 MG PO TABS: 40.0000 mg | ORAL_TABLET | Freq: Every day | ORAL | Status: AC

## 2015-05-09 MED ORDER — THIAMINE HCL 100 MG PO TABS: 100.0000 mg | ORAL_TABLET | Freq: Every day | ORAL | Status: AC

## 2015-05-09 MED ORDER — LISINOPRIL 2.5 MG PO TABS: 2.5000 mg | ORAL_TABLET | Freq: Every day | ORAL | Status: AC

## 2015-05-09 MED FILL — TOPIRAMATE 25 MG TABLET: 25 | 30 days supply | Qty: 30 | Fill #0

## 2015-05-09 NOTE — Telephone Encounter (Signed)
Amy-Speech therapist with [facility] requesting to start pt's therapy next week. Verbal orders approved.

## 2015-05-09 NOTE — Patient Instructions (Signed)
Today you should call [facility] for continued Speech therapy You should also call the Neurologist for an appt in 2 weeks No smoking No alcohol Pick up medicines today The Senokot is a medicine for constipation, so he may or may not need it. The others should be taken as prescribed everyday!

## 2015-05-09 NOTE — Progress Notes (Signed)
Follow up stroke

## 2015-05-09 NOTE — Progress Notes (Signed)
Gary Barker  ZOX:[id number]  WJX:[id number]  DOB - [birth year]  Chief Complaint  Patient presents with  . Hospitalization Follow-up       Subjective:   Gary Barker is a 50 y.o. male here today for establishment of care. He was hospitalized Jan 14-26th . He has a history of alcohol and tobacco use and presented with right sided weakness and receptive and expressive aphasia secondary to stroke in the LMCA territory confirmed by CT and MRI.  He was outside tpa window on arrival.CT head on 01/14 with acute infarction involving the left insular ribbon and external capsule, and potential thrombus within the left middle cerebral artery. CTA head and neck on 01/14 suspicious for multifocal areas of nonhemorrhagic left MCA territory infarction. MRI brain on 01/14 with extensive areas of acute nonhemorrhagic infarction throughout much of the left MCA territory. Repeat CT head on 01/15 wihtout acute intracranial hemorrhage but large left MCA territory ischemic infarct. No sign of infection. UDS negative. ETOH level normal. EKG NSR on further evaluation. Unclear source of stroke but concern for multiple emboli breaking from a larger clot source given distribution of infarcts. TEE on 1/17 without thrombus or PFO. Loop recorder was inserted by cardiology on 1/18. Hypercoagulable labs normal. Patient was evaluated and transferred to CIR on 01/19.He remained there for ST, PT, and OT thru 05/08/15.  He is doing better. Per wife, gait is back to normal. He still has some issues with confusion at times and speech. No CP. Breathing ok. Less HAs. He was referred to [facility] for continued ST but family has not heard from them. He was to be on multiple risk factor modifying meds but family states that he is only taking Tramadol.   ROS: GEN: denies fever or chills, denies change in weight Skin: denies lesions or rashes HEENT: denies headache, earache, epistaxis, sore throat, or neck pain LUNGS: denies SHOB,  dyspnea, PND, orthopnea CV: denies CP or palpitations ABD: denies abd pain, N or V EXT: denies muscle spasms or swelling; no pain in lower ext, no weakness NEURO: denies numbness or tingling, denies sz, stroke or TIA  ALLERGIES: Allergies  Allergen Reactions  . Fish Allergy     SWELLING    . Shellfish Allergy Swelling    PAST MEDICAL HISTORY: Past Medical History  Diagnosis Date  . Gout attack 04/2015  . Hyperlipidemia     PAST SURGICAL HISTORY: Past Surgical History  Procedure Laterality Date  . Tee without cardioversion N/A 04/29/2015    Procedure: TRANSESOPHAGEAL ECHOCARDIOGRAM (TEE);  Surgeon: Jake Bathe, MD;  Location: [facility] ENDOSCOPY;  Service: Cardiovascular;  Laterality: N/A;  . Ep implantable device N/A 04/30/2015    Procedure: Loop Recorder Insertion;  Surgeon: Duke Salvia, MD;  Location: [facility] INVASIVE CV LAB;  Service: Cardiovascular;  Laterality: N/A;    MEDICATIONS AT HOME: Prior to Admission medications   Medication Sig Start Date End Date Taking? Authorizing Provider  traMADol (ULTRAM) 50 MG tablet Take 1 tablet (50 mg total) by mouth every 6 (six) hours. For headaches. Can decrease to three times a day in a couple of weeks as headaches improve. 05/08/15  Yes Evlyn Kanner Love, PA-C  albuterol-ipratropium (COMBIVENT) 18-103 MCG/ACT inhaler Inhale 1-2 puffs into the lungs every 6 (six) hours as needed for wheezing or shortness of breath. Reported on 05/09/2015    Historical Provider, MD  aspirin 325 MG tablet Take 1 tablet (325 mg total) by mouth daily. 05/09/15   Vivianne Master,  PA-C  atorvastatin (LIPITOR) 40 MG tablet Take 1 tablet (40 mg total) by mouth daily at 6 PM. 05/09/15   Vivianne Master, PA-C  lisinopril (PRINIVIL,ZESTRIL) 2.5 MG tablet Take 1 tablet (2.5 mg total) by mouth at bedtime. 05/09/15   Vivianne Master, PA-C  multivitamin (PROSIGHT) TABS tablet Take 1 tablet by mouth daily. Patient not taking: Reported on 05/09/2015 05/08/15   Evlyn Kanner Love, PA-C  nicotine  (NICODERM CQ - DOSED IN MG/24 HOURS) 14 mg/24hr patch Place 1 patch (14 mg total) onto the skin daily. Patient not taking: Reported on 05/09/2015 05/08/15   Evlyn Kanner Love, PA-C  polyethylene glycol [facility] / GLYCOLAX) packet Take 17 g by mouth 2 (two) times daily. Patient not taking: Reported on 05/09/2015 05/08/15   Evlyn Kanner Love, PA-C  senna-docusate (SENOKOT-S) 8.6-50 MG tablet Take 2 tablets by mouth 2 (two) times daily. 05/09/15   Tiffany Netta Cedars, PA-C  thiamine 100 MG tablet Take 1 tablet (100 mg total) by mouth daily. 05/09/15   Tiffany Netta Cedars, PA-C  topiramate (TOPAMAX) 25 MG tablet Take 1 tablet (25 mg total) by mouth at bedtime. 05/09/15   Vivianne Master, PA-C     Objective:   Filed Vitals:   05/09/15 0934  BP: 137/87  Pulse: 77  Temp: 97.8 F (36.6 C)  TempSrc: Oral  Resp: 18  Height:  (1.676 m)  Weight: 138 lb (62.596 kg)  SpO2: 97%    Exam General appearance : Awake, alert, not in any distress. Speech Clear. Not toxic looking HEENT: Atraumatic and Normocephalic, pupils equally reactive to light and accomodation Neck: supple, no JVD. No cervical lymphadenopathy.  Chest:Good air entry bilaterally, no added sounds  CVS: S1 S2 regular, no murmurs.  Abdomen: Bowel sounds present, Non tender and not distended with no gaurding, rigidity or rebound. Extremities: B/L Lower Ext shows no edema, both legs are warm to touch Neurology: Awake alert, and oriented X 3, CN II-XII intact, Non focal  Data Review Lab Results  Component Value Date   HGBA1C 6.5* 04/27/2015     Assessment & Plan  1. LMCA CVA  -[facility]  -RF modification: smoking and ETOH cessation discussed  -ASA  -Neuro follow up in 2 weeks  -Cards follow up wound check s/p loop recorder 05/22/15 2. HTN  -Lisinopril    -DASH diet  -increase exercise as tolerated 3. Hypercholersterolemia  -Lipitor 4. Smoker-cessation discussed 5. ETOH use-cessation discussed    Return in about 2  weeks (around 05/23/2015).   The patient was given clear instructions to go to ER or return to medical center if symptoms don't improve, worsen or new problems develop. The patient verbalized understanding. No labs this visit.  This note has been created with Education officer, environmental. Any transcriptional errors are unintentional.    Scot Jun, PA-C [facility] [place], [place] [phone]   05/09/2015, 10:04 AM

## 2015-05-13 ENCOUNTER — Telehealth: Payer: Self-pay

## 2015-05-13 NOTE — Telephone Encounter (Signed)
He can try taking Tylenol 650 mg to 1000 mg in place of tramadol

## 2015-05-13 NOTE — Telephone Encounter (Signed)
Amy would like you to know that pt is only taking one Tramadol per day for his headaches. The one tablet of tramadol seems to relieve the headache entirely. Amy would like to know if we could try to take him off of the Tramadol if he no longer needs it. Please advise.

## 2015-05-13 NOTE — Telephone Encounter (Signed)
Amy Mcgee- RN from [facility] requesting verbal orders for a Child psychotherapist to hep with community services. Also needs clarification on some medications. She can be contacted at [phone].

## 2015-05-14 NOTE — Telephone Encounter (Signed)
Gary Barker with [facility] has been advised.

## 2015-05-16 NOTE — Consult Note (Signed)
NEUROCOGNITIVE STATUS EXAMINATION - CONFIDENTIAL [facility]   [name] is a 50 year old man, who was seen for a neurocognitive status examination in the setting of left MCA infarct to assess cognitive abilities and for potential mood disruption.    Cognitive Functioning:  [name] demonstrated a fluent aphasia during the current visit.  He was frequently perseverative in his speech, often made paraphasic errors and was unable to express a coherent thought most times.  He could repeat single words, but was unable to repeat more than that.  He was also able to read and copy single words, but could not write them on his own without a written example.  At times, he seemed to comprehend what the examiner was saying and responded appropriately using head nods.  However, there were other times, when he did not seem to comprehend auditory instructions.  There was one occasion during the appointment when [name] language skills appeared relatively intact.  This was when he was asked if he had been feeling sad and he replied, "I was mad before because I couldn't understand, but now I can understand and I don't feel mad."  Other times, his speech was much more disjointed.  For example, when asked if we were currently in a school, he replied, "Yes.  And the man will come and go to work and school and work and yes."  He was not able to correctly raise his hand to indicate when the examiner said the month or season that we are in and instead, stared blankly at the examiner; therefore, it was unclear if he was oriented to date or location.  When asked to write the word, "hello" he wrote his address, but named letters other than those that he was writing as he was writing.    Emotional Functioning:  During the clinical interview, [name] was unable to engage in a meaningful conversation with the examiner owing to severe dysphasia, as described above.   However, he was able to indicate at one point that while he was initially upset about his current situation, that his mood seems to be improving.    Impressions and Recommendations:  Feedback was provided immediately following testing to [name] and his wife.  A Spanish translator was present when providing feedback as English is not his wife's native language.  [name] demonstrated marked dysphasia, which, given fluctuating ability to comprehend, is likely somewhere between a conduction aphasia and a Wernicke's aphasia.  He will likely experience most recovery of language skills within the first 2 months, but may notice subtle improvement up to 1 year; it is difficult to predict how much language ability he will regain.  His wife was advised to use a sheet that has words printed on it to indicate his needs (e.g. bathroom, hungry, thirsty, yes, no, etc.) so that he can point to what he needs in order to facilitate communication after discharge, given that he was able to read single words.  At this time, there was no evidence of clinically significant mood disruption, though [name] and his wife were provided with psychoeducation regarding risk for development of depression in cases of stroke, particularly those when aphasia results.  They were agreeable to informing his care team should any symptoms of serious mood disruption arise.    DIAGNOSIS:   stroke  Leavy Cella, Psy.D.  Clinical Neuropsychologist

## 2015-05-19 ENCOUNTER — Telehealth: Payer: Self-pay | Admitting: Neurology

## 2015-05-19 NOTE — Telephone Encounter (Signed)
Called and left patient a message and his sister Frantz Quattrone [phone].  Spoke to Dr. Rennis Petty he is going to hold of on scheduling for Doppler. Patient will have Doppler here at the office .  Patient needs a stroke follow apt. With Dr. Rennis Petty  Diane is aware of status and she will Sister to schedule. I will Annabelle Harman) keep up scheduling of Doppler.

## 2015-05-20 ENCOUNTER — Telehealth: Payer: Self-pay | Admitting: *Deleted

## 2015-05-20 ENCOUNTER — Emergency Department (HOSPITAL_COMMUNITY): Payer: Medicaid Other

## 2015-05-20 ENCOUNTER — Encounter (HOSPITAL_COMMUNITY): Payer: Self-pay

## 2015-05-20 ENCOUNTER — Emergency Department (HOSPITAL_COMMUNITY)
Admission: EM | Admit: 2015-05-20 | Discharge: 2015-05-20 | Disposition: A | Discharge status: Home / Self Care | Payer: Medicaid Other | Attending: Emergency Medicine | Admitting: Emergency Medicine

## 2015-05-20 DIAGNOSIS — F419 Anxiety disorder, unspecified: Secondary | ICD-10-CM | POA: Diagnosis not present

## 2015-05-20 DIAGNOSIS — G8929 Other chronic pain: Secondary | ICD-10-CM | POA: Diagnosis not present

## 2015-05-20 DIAGNOSIS — E785 Hyperlipidemia, unspecified: Secondary | ICD-10-CM | POA: Insufficient documentation

## 2015-05-20 DIAGNOSIS — R079 Chest pain, unspecified: Secondary | ICD-10-CM | POA: Insufficient documentation

## 2015-05-20 DIAGNOSIS — F17210 Nicotine dependence, cigarettes, uncomplicated: Secondary | ICD-10-CM | POA: Diagnosis not present

## 2015-05-20 DIAGNOSIS — Z79899 Other long term (current) drug therapy: Secondary | ICD-10-CM | POA: Diagnosis not present

## 2015-05-20 DIAGNOSIS — M109 Gout, unspecified: Secondary | ICD-10-CM | POA: Insufficient documentation

## 2015-05-20 DIAGNOSIS — Z8673 Personal history of transient ischemic attack (TIA), and cerebral infarction without residual deficits: Secondary | ICD-10-CM | POA: Diagnosis not present

## 2015-05-20 DIAGNOSIS — R4701 Aphasia: Secondary | ICD-10-CM | POA: Diagnosis not present

## 2015-05-20 DIAGNOSIS — Z7982 Long term (current) use of aspirin: Secondary | ICD-10-CM | POA: Diagnosis not present

## 2015-05-20 DIAGNOSIS — R200 Anesthesia of skin: Secondary | ICD-10-CM | POA: Diagnosis present

## 2015-05-20 HISTORY — DX: Cerebral infarction, unspecified: I63.9

## 2015-05-20 LAB — COMPREHENSIVE METABOLIC PANEL
ALBUMIN: 3.9 g/dL (ref 3.5–5.0)
ALT: 33 U/L (ref 17–63)
ANION GAP: 9 (ref 5–15)
AST: 25 U/L (ref 15–41)
Alkaline Phosphatase: 95 U/L (ref 38–126)
BUN: 10 mg/dL (ref 6–20)
CALCIUM: 9.8 mg/dL (ref 8.9–10.3)
CO2: 25 mmol/L (ref 22–32)
Chloride: 107 mmol/L (ref 101–111)
Creatinine, Ser: 0.72 mg/dL (ref 0.61–1.24)
GFR calc non Af Amer: >60 mL/min (ref >60)
GLUCOSE: 133 mg/dL — AB (ref 65–99)
POTASSIUM: 3.9 mmol/L (ref 3.5–5.1)
SODIUM: 141 mmol/L (ref 135–145)
TOTAL PROTEIN: 7.4 g/dL (ref 6.5–8.1)
Total Bilirubin: 0.5 mg/dL (ref 0.3–1.2)

## 2015-05-20 LAB — I-STAT CHEM 8, ED
BUN: 12 mg/dL (ref 6–20)
CALCIUM ION: 1.25 mmol/L — AB (ref 1.12–1.23)
CHLORIDE: 104 mmol/L (ref 101–111)
Creatinine, Ser: 0.6 mg/dL — ABNORMAL LOW (ref 0.61–1.24)
Glucose, Bld: 129 mg/dL — ABNORMAL HIGH (ref 65–99)
HEMATOCRIT: 47 % (ref 39.0–52.0)
Hemoglobin: 16 g/dL (ref 13.0–17.0)
Potassium: 3.8 mmol/L (ref 3.5–5.1)
SODIUM: 141 mmol/L (ref 135–145)
TCO2: 22 mmol/L (ref 0–100)

## 2015-05-20 LAB — CBC
HCT: 43.3 % (ref 39.0–52.0)
Hemoglobin: 15 g/dL (ref 13.0–17.0)
MCH: 31.9 pg (ref 26.0–34.0)
MCHC: 34.6 g/dL (ref 30.0–36.0)
MCV: 92.1 fL (ref 78.0–100.0)
PLATELETS: 277 10*3/uL (ref 150–400)
RBC: 4.7 MIL/uL (ref 4.22–5.81)
RDW: 11.7 % (ref 11.5–15.5)
WBC: 6.6 10*3/uL (ref 4.0–10.5)

## 2015-05-20 LAB — DIFFERENTIAL
Basophils Absolute: 0 10*3/uL (ref 0.0–0.1)
Basophils Relative: 1 %
EOS ABS: 0.2 10*3/uL (ref 0.0–0.7)
EOS PCT: 2 %
Lymphocytes Relative: 38 %
Lymphs Abs: 2.5 10*3/uL (ref 0.7–4.0)
MONO ABS: 0.6 10*3/uL (ref 0.1–1.0)
Monocytes Relative: 9 %
NEUTROS PCT: 50 %
Neutro Abs: 3.3 10*3/uL (ref 1.7–7.7)

## 2015-05-20 LAB — I-STAT TROPONIN, ED: Troponin i, poc: 0 ng/mL (ref 0.00–0.08)

## 2015-05-20 LAB — PROTIME-INR
INR: 1.17 (ref 0.00–1.49)
PROTHROMBIN TIME: 15.1 s (ref 11.6–15.2)

## 2015-05-20 LAB — APTT: aPTT: 31 seconds (ref 24–37)

## 2015-05-20 MED ORDER — LORAZEPAM 1 MG PO TABS: 1.0000 mg | ORAL_TABLET | Freq: Four times a day (QID) | ORAL | Status: DC | PRN

## 2015-05-20 NOTE — Telephone Encounter (Signed)
Becky from [facility] called and says that Kaylyn Layer caregiver called and he is having pain in his right chest and numbness in his right face.  He is very volatile and refuses to go to ED or Urgent Care. I spoke with caregiver(speaks some english) he started having chest pain in his back last night. Numbness in his face today. "doesnt report seeing changes in his face as appearance"Home # [phone] . Please advise.

## 2015-05-20 NOTE — Discharge Instructions (Signed)
Trastorno de ansiedad generalizada (Generalized Anxiety Disorder) El trastorno de ansiedad generalizada es un trastorno mental. Interfiere en las funciones vitales, incluyendo las relaciones, el trabajo y la escuela.  Es diferente de la ansiedad normal que todas las personas experimentan en algn momento de su vida en respuesta a sucesos y actividades especficas. En verdad, la ansiedad normal nos ayuda a prepararnos y atravesar estos acontecimientos y actividades de la vida. La ansiedad normal desaparece despus de que el evento o la actividad ha finalizado.  El trastorno de ansiedad generalizada no est necesariamente relacionada con eventos o actividades especficas. Tambin causa un exceso de ansiedad en proporcin a sucesos o actividades especficas. En este trastorno la ansiedad es difcil de controlar. Los sntomas pueden variar de leves a muy graves. Las personas que sufren de trastorno de ansiedad generalizada pueden tener intensas olas de ansiedad con sntomas fsicos (ataques de pnico).  SNTOMAS  La ansiedad y la preocupacin asociada a este trastorno son difciles de controlar. Esta ansiedad y la preocupacin estn relacionados con muchos eventos de la vida y sus actividades y tambin ocurre durante ms das de los que no ocurre, durante 6 meses o ms. Las personas que la sufren pueden tener tres o ms de los siguientes sntomas (uno o ms en los nios):   Agitacin   Fatiga.  Dificultades de concentracin.   Irritabilidad.  Tensin muscular  Dificultad para dormirse o sueo poco satisfactorio. DIAGNSTICO  Se diagnostica a travs de una evaluacin realizada por el mdico. El mdico le har preguntas acerca de su estado de nimo, sntomas fsicos y sucesos de su vida. Le har preguntas sobre su historia clnica, el consumo de alcohol o drogas, incluyendo los medicamentos recetados. Tambin le har un examen fsico e indicar anlisis de sangre. Ciertas enfermedades y el uso de  determinadas sustancias pueden causar sntomas similares a este trastorno. Su mdico lo puede derivar a un especialista en salud mental para una evaluacin ms profunda..  TRATAMIENTO  Las terapias siguientes se utilizan en el tratamiento de este trastorno:   Medicamentos - Se recetan antidepresivos para el control diario a largo plazo. Pueden indicarse tambin medicamentos para combatir la ansiedad en los casos graves, especialmente cuando ocurren ataques de pnico.   Terapia conversada (psicoterapia) Ciertos tipos de psicoterapia pueden ser tiles en el tratamiento del trastorno de ansiedad generalizada, proporcionando apoyo, educacin y orientacin. Una forma de psicoterapia llamada terapia cognitivo-conductual puede ensearle formas saludables de pensar y reaccionar a los eventos y actividades de la vida diaria.  Tcnicasde manejo del estrs- Estas tcnicas incluyen el yoga, la meditacin y el ejercicio y pueden ser muy tiles cuando se practican con regularidad. Un especialista en salud mental puede ayudar a determinar qu tratamiento es mejor para usted. Algunas personas obtienen mejora con una terapia. Sin embargo, otras personas requieren una combinacin de terapias.    Esta informacin no tiene como fin reemplazar el consejo del mdico. Asegrese de hacerle al mdico cualquier pregunta que tenga.   Document Released: 07/24/2012 Document Revised: 04/19/2014 Elsevier Interactive Patient Education 2016 Elsevier Inc.  

## 2015-05-20 NOTE — ED Notes (Signed)
Translator phone used for triage assessment. Pt speaks spanish, understands some [place]. Pt here with c/o right CP and aphasia and slurred speech. He had stroke Jan 13th so the slurred speech is not new, but last night at 11pm the left side of his face started to become numb. Strong equal hand grasps, no weakness. No facial droop noted.

## 2015-05-20 NOTE — Telephone Encounter (Signed)
Advise ED visit, if patient refuses should at least go to primary care physician

## 2015-05-20 NOTE — ED Notes (Signed)
Per Dr. Effie Shy hold all orders for now.

## 2015-05-20 NOTE — ED Notes (Signed)
MD, this nurse and Tobi Bastos, nursing student at bedside.

## 2015-05-20 NOTE — ED Notes (Signed)
Pt is upset about being in ED, states "my mother made me come", very slow to make eye contact.

## 2015-05-20 NOTE — ED Provider Notes (Signed)
CSN: [id number]     Arrival date & time 05/20/15  1632 History   First MD Initiated Contact with Patient 05/20/15 1705     Chief Complaint  Patient presents with  . Chest Pain  . Aphasia     (Consider location/radiation/quality/duration/timing/severity/associated sxs/prior Treatment) HPI   Gary Barker is a 50 y.o. male who presents for evaluation of right facial numbness. She is somewhat difficult to assess because he is very anxious, and defers answers to his mother and wife were with him. He complains of difficulty seeing from his left eye, and an abnormal feeling of his left face as well. At the time of evaluation in the ED, he states that he can feel touch on the right side of his face and does not have any numbness in the right side of his face. He is quite distracted about the left as in problem, and his left face feeling "funny". He is taking his usual medications. He followed up with his PCP as scheduled about 10 days ago, for initiation of new care. He has upcoming follow-up appointments with the neurology service, who plan on doing a Doppler study, of the brain. The patient is also troubled by chronic ongoing right-sided chest pain which is apparently been present since 2006. There's been no recent fever, chills, cough, vomiting, new weakness or dizziness. There are no other no modifying factors.   Past Medical History  Diagnosis Date  . Gout attack 04/2015  . Hyperlipidemia   . Stroke [facility]) Apr 25 2015   Past Surgical History  Procedure Laterality Date  . Tee without cardioversion N/A 04/29/2015    Procedure: TRANSESOPHAGEAL ECHOCARDIOGRAM (TEE);  Surgeon: Jake Bathe, MD;  Location: [facility] ENDOSCOPY;  Service: Cardiovascular;  Laterality: N/A;  . Ep implantable device N/A 04/30/2015    Procedure: Loop Recorder Insertion;  Surgeon: Duke Salvia, MD;  Location: [facility] INVASIVE CV LAB;  Service: Cardiovascular;  Laterality: N/A;   Family History  Problem Relation Age of Onset  .  Seizures Mother   . Diabetes Brother   . Hepatitis C Mother   . Fibromyalgia Mother    Social History  Substance Use Topics  . Smoking status: Current Every Day Smoker -- 0.50 packs/day for 30 years    Types: Cigarettes  . Smokeless tobacco: Never Used  . Alcohol Use: No    Review of Systems  All other systems reviewed and are negative.     Allergies  Fish allergy and Shellfish allergy  Home Medications   Prior to Admission medications   Medication Sig Start Date End Date Taking? Authorizing Provider  aspirin 325 MG tablet Take 1 tablet (325 mg total) by mouth daily. 05/09/15  Yes Tiffany Netta Cedars, PA-C  atorvastatin (LIPITOR) 40 MG tablet Take 1 tablet (40 mg total) by mouth daily at 6 PM. 05/09/15  Yes Tiffany Netta Cedars, PA-C  lisinopril (PRINIVIL,ZESTRIL) 2.5 MG tablet Take 1 tablet (2.5 mg total) by mouth at bedtime. 05/09/15  Yes Tiffany Netta Cedars, PA-C  thiamine 100 MG tablet Take 1 tablet (100 mg total) by mouth daily. 05/09/15  Yes Tiffany Netta Cedars, PA-C  topiramate (TOPAMAX) 25 MG tablet Take 1 tablet (25 mg total) by mouth at bedtime. 05/09/15  Yes Tiffany Netta Cedars, PA-C  albuterol-ipratropium (COMBIVENT) 18-103 MCG/ACT inhaler Inhale 1-2 puffs into the lungs every 6 (six) hours as needed for wheezing or shortness of breath. Reported on 05/09/2015    Historical Provider, MD  multivitamin (PROSIGHT) TABS tablet Take  1 tablet by mouth daily. Patient not taking: Reported on 05/09/2015 05/08/15   Evlyn Kanner Love, PA-C  nicotine (NICODERM CQ - DOSED IN MG/24 HOURS) 14 mg/24hr patch Place 1 patch (14 mg total) onto the skin daily. Patient not taking: Reported on 05/09/2015 05/08/15   Evlyn Kanner Love, PA-C  polyethylene glycol [facility] / GLYCOLAX) packet Take 17 g by mouth 2 (two) times daily. Patient not taking: Reported on 05/09/2015 05/08/15   Evlyn Kanner Love, PA-C  senna-docusate (SENOKOT-S) 8.6-50 MG tablet Take 2 tablets by mouth 2 (two) times daily. Patient not taking: Reported on 05/20/2015 05/09/15    Vivianne Master, PA-C  traMADol (ULTRAM) 50 MG tablet Take 1 tablet (50 mg total) by mouth every 6 (six) hours. For headaches. Can decrease to three times a day in a couple of weeks as headaches improve. Patient not taking: Reported on 05/20/2015 05/08/15   Evlyn Kanner Love, PA-C   BP 132/91 mmHg  Pulse 92  Temp(Src) 98.1 F (36.7 C) (Oral)  Resp 32  SpO2 97% Physical Exam  Constitutional: He is oriented to person, place, and time. He appears well-developed and well-nourished.  HENT:  Head: Normocephalic and atraumatic.  Right Ear: External ear normal.  Left Ear: External ear normal.  Eyes: Conjunctivae and EOM are normal. Pupils are equal, round, and reactive to light.  Neck: Normal range of motion and phonation normal. Neck supple.  Cardiovascular: Normal rate, regular rhythm and normal heart sounds.   Pulmonary/Chest: Effort normal and breath sounds normal. He exhibits no bony tenderness.  Abdominal: Soft. There is no tenderness.  Musculoskeletal: Normal range of motion.  He walks with a normal gait.  Neurological: He is alert and oriented to person, place, and time. No cranial nerve deficit or sensory deficit. He exhibits normal muscle tone. Coordination normal.  No dysarthria and aphasia or nystagmus. There is no facial asymmetry.  Skin: Skin is warm, dry and intact.  Psychiatric:  Patient is moderately anxious, but can be redirected, and follows commands well.  Nursing note and vitals reviewed.   ED Course  Procedures (including critical care time)  Medications - No data to display  Patient Vitals for the past 24 hrs:  BP Temp Temp src Pulse Resp SpO2  05/20/15 1930 132/91 mmHg - - 92 (!) 32 -  05/20/15 1655 114/86 mmHg 98.1 F (36.7 C) Oral 91 16 97 %    7:57 PM Reevaluation with update and discussion. After initial assessment and treatment, an updated evaluation reveals no additional complaints. He continues to be moderately angry and aggressive. He remembers requests  medication to help him relax. Discussed all findings with family members. They agree with the treatment plan. They will make sure he follows up with his neurologist, as planned. Ahmon Tosi L    Labs Review Labs Reviewed  COMPREHENSIVE METABOLIC PANEL - Abnormal; Notable for the following:    Glucose, Bld 133 (*)    All other components within normal limits  I-STAT CHEM 8, ED - Abnormal; Notable for the following:    Creatinine, Ser 0.60 (*)    Glucose, Bld 129 (*)    Calcium, Ion 1.25 (*)    All other components within normal limits  PROTIME-INR  APTT  CBC  DIFFERENTIAL  I-STAT TROPOININ, ED  CBG MONITORING, ED    Imaging Review Ct Head Wo Contrast  05/20/2015  CLINICAL DATA:  Slurred speech and left-sided numbness beginning at 11 p.m. last night. Initial encounter. EXAM: CT HEAD WITHOUT CONTRAST TECHNIQUE:  Contiguous axial images were obtained from the base of the skull through the vertex without intravenous contrast. COMPARISON:  Head CT scan 04/27/2015 and 04/26/2015. Brain MRI 04/26/2015. FINDINGS: Left MCA territory infarct seen on prior examinations demonstrates expected evolutionary change. No hemorrhagic transformation is identified. Hyperdensity in the left MCA is identified compatible with clot, unchanged since the most recent head CT. No new infarct, mass lesion, midline shift or abnormal extra-axial fluid collection is identified. The calvarium is intact. Imaged paranasal sinuses and mastoid air cells are clear. IMPRESSION: No acute abnormality. Expected evolutionary change in the patient's large left MCA territory infarct as seen on prior studies is noted. Electronically Signed   By: Drusilla Kanner M.D.   On: 05/20/2015 18:01   I have personally reviewed and evaluated these images and lab results as part of my medical decision-making.   EKG Interpretation None      MDM   Final diagnoses:  Anxiety    Anxiety, which is nonspecific and likely related to his recent  stroke, indirectly. No evidence for new stroke symptoms. Doubt ACS, PE or pneumonia.   Nursing Notes Reviewed/ Care Coordinated Applicable Imaging Reviewed Interpretation of Laboratory Data incorporated into ED treatment  The patient appears reasonably screened and/or stabilized for discharge and I doubt any other medical condition or other [facility] requiring further screening, evaluation, or treatment in the ED at this time prior to discharge.  Plan: Home Medications- Ativan; Home Treatments- rest; return here if the recommended treatment, does not improve the symptoms; Recommended follow up- neurology and primary care, as planned     Mancel Bale, MD 05/20/15 1958

## 2015-05-20 NOTE — Telephone Encounter (Signed)
They have sent him to hospital

## 2015-05-21 ENCOUNTER — Telehealth: Payer: Self-pay | Admitting: Internal Medicine

## 2015-05-21 NOTE — Telephone Encounter (Signed)
No AF episodes since implant, wound check tomorrow at noon. Let me know if there is any intervention necessary at his appt tomorrow. Thanks!

## 2015-05-21 NOTE — Telephone Encounter (Signed)
Home health nurse calling to let us know that pt has had 2-3 nose bleeds since he has been home, pt on 325 mg asa, pt also seen in ER yesterday at [facility] for R sided numbness and chest pain-wanted to m ake you aware since pt had appt tomorrow

## 2015-05-22 ENCOUNTER — Ambulatory Visit (INDEPENDENT_AMBULATORY_CARE_PROVIDER_SITE_OTHER): Payer: Self-pay | Admitting: *Deleted

## 2015-05-22 ENCOUNTER — Encounter: Payer: Self-pay | Admitting: Internal Medicine

## 2015-05-22 DIAGNOSIS — Z95818 Presence of other cardiac implants and grafts: Secondary | ICD-10-CM

## 2015-05-22 LAB — CUP PACEART INCLINIC DEVICE CHECK: Date Time Interrogation Session: 20170209123524

## 2015-05-22 NOTE — Progress Notes (Signed)
Loop wound check in clinic. Incision edges approximated, without redness swelling or drainage. Battery status: good. R-waves 0.23mV. 1 pause episode- 5 seconds mid afternoon 05/07/15, patient doesn't recall being symptomatic. Patient and family report 3 nose bleeds since initiating  daily ASA- last occurred on February 4th. ER visit 05/20/15 for right sided chest and face numbness- no findings per family. Will review with Dr. Graciela Husbands and call family if further recommendations are made- they are agreeable. Smoking cessation information provided at patient's request. Patient advised on Care[employer] monitoring. Monthly summary reports and ROV with SK 07/25/15.

## 2015-05-22 NOTE — Telephone Encounter (Signed)
After reviewing with Dr. Graciela Husbands- he advises that they speak with neurology about nose bleeds and ASA dosage. Wife called and made aware- she verbalizes understanding. Advised to call with any other concerns.

## 2015-05-23 ENCOUNTER — Telehealth: Payer: Self-pay

## 2015-05-23 ENCOUNTER — Ambulatory Visit: Payer: Medicaid Other | Attending: Family Medicine | Admitting: Family Medicine

## 2015-05-23 ENCOUNTER — Encounter: Payer: Self-pay | Admitting: Family Medicine

## 2015-05-23 VITALS — BP 132/91 | HR 91 | Temp 98.5°F | Resp 16 | Ht 66.0 in | Wt 132.2 lb

## 2015-05-23 DIAGNOSIS — E118 Type 2 diabetes mellitus with unspecified complications: Secondary | ICD-10-CM | POA: Diagnosis not present

## 2015-05-23 DIAGNOSIS — R4182 Altered mental status, unspecified: Secondary | ICD-10-CM | POA: Diagnosis not present

## 2015-05-23 DIAGNOSIS — M62838 Other muscle spasm: Secondary | ICD-10-CM

## 2015-05-23 DIAGNOSIS — I639 Cerebral infarction, unspecified: Secondary | ICD-10-CM | POA: Diagnosis not present

## 2015-05-23 DIAGNOSIS — Z79899 Other long term (current) drug therapy: Secondary | ICD-10-CM | POA: Diagnosis not present

## 2015-05-23 DIAGNOSIS — R51 Headache: Secondary | ICD-10-CM | POA: Insufficient documentation

## 2015-05-23 DIAGNOSIS — E785 Hyperlipidemia, unspecified: Secondary | ICD-10-CM | POA: Insufficient documentation

## 2015-05-23 DIAGNOSIS — Z8673 Personal history of transient ischemic attack (TIA), and cerebral infarction without residual deficits: Secondary | ICD-10-CM | POA: Insufficient documentation

## 2015-05-23 DIAGNOSIS — Z7982 Long term (current) use of aspirin: Secondary | ICD-10-CM | POA: Diagnosis not present

## 2015-05-23 DIAGNOSIS — E119 Type 2 diabetes mellitus without complications: Secondary | ICD-10-CM | POA: Diagnosis not present

## 2015-05-23 DIAGNOSIS — J45909 Unspecified asthma, uncomplicated: Secondary | ICD-10-CM | POA: Insufficient documentation

## 2015-05-23 DIAGNOSIS — I10 Essential (primary) hypertension: Secondary | ICD-10-CM | POA: Diagnosis not present

## 2015-05-23 DIAGNOSIS — J4530 Mild persistent asthma, uncomplicated: Secondary | ICD-10-CM | POA: Insufficient documentation

## 2015-05-23 DIAGNOSIS — R4701 Aphasia: Secondary | ICD-10-CM | POA: Diagnosis not present

## 2015-05-23 DIAGNOSIS — R519 Headache, unspecified: Secondary | ICD-10-CM

## 2015-05-23 DIAGNOSIS — I69320 Aphasia following cerebral infarction: Secondary | ICD-10-CM

## 2015-05-23 HISTORY — DX: Type 2 diabetes mellitus without complications: E11.9

## 2015-05-23 LAB — GLUCOSE, POCT (MANUAL RESULT ENTRY): POC GLUCOSE: 150 mg/dL — AB (ref 70–99)

## 2015-05-23 MED ORDER — IPRATROPIUM-ALBUTEROL 20-100 MCG/ACT IN AERS: 1.0000 | INHALATION_SPRAY | Freq: Four times a day (QID) | RESPIRATORY_TRACT | Status: DC

## 2015-05-23 MED ORDER — IPRATROPIUM-ALBUTEROL 18-103 MCG/ACT IN AERO: 2.0000 | INHALATION_SPRAY | Freq: Four times a day (QID) | RESPIRATORY_TRACT | Status: DC | PRN

## 2015-05-23 MED ORDER — ACCU-CHEK SOFTCLIX LANCET DEV MISC: Status: DC

## 2015-05-23 MED ORDER — TOPIRAMATE 25 MG PO TABS: 25.0000 mg | ORAL_TABLET | Freq: Two times a day (BID) | ORAL | Status: DC

## 2015-05-23 MED ORDER — ACCU-CHEK AVIVA DEVI: Status: DC

## 2015-05-23 MED ORDER — TIZANIDINE HCL 4 MG PO TABS: 4.0000 mg | ORAL_TABLET | Freq: Three times a day (TID) | ORAL | Status: DC | PRN

## 2015-05-23 MED ORDER — GLUCOSE BLOOD VI STRP: ORAL_STRIP | Status: DC

## 2015-05-23 MED FILL — ACCU-CHEK AVIVA PLUS METER: W/DEVICE | 30 days supply | Qty: 1 | Fill #0

## 2015-05-23 MED FILL — TOPIRAMATE 25 MG TABLET: 25 | 30 days supply | Qty: 60 | Fill #0

## 2015-05-23 MED FILL — ACCU-CHEK AVIVA PLUS TEST S: 25 days supply | Qty: 50 | Fill #0

## 2015-05-23 MED FILL — ACCU-CHEK SOFTCLIX LANCETS: 25 days supply | Qty: 100 | Fill #0

## 2015-05-23 MED FILL — tiZANidine HCL 4 MG TABS: 4 | 30 days supply | Qty: 90 | Fill #0

## 2015-05-23 NOTE — Telephone Encounter (Signed)
Amy Mcgee-speech therapist-called stating that pt cancelled his visit today due to an appointment he had scheduled.

## 2015-05-23 NOTE — Progress Notes (Signed)
Subjective:  Patient ID: Gary Barker, male    DOB: [birth year]  Age: 50 y.o. MRN: [id number]  CC: Follow-up   HPI Gary Barker is a 50 year old male with a history of hypertension, hyperlipidemia, tobacco abuse with recent hospitalization for CVA secondary to large left MCA territory infarct after he had presented with right-sided weakness, globally aphasia, altered mental status and did not receive TPA due to late presentation.  He was seen here in clinic for hospital follow-up 2 weeks ago and since then presented to the ED 3 days ago with right facial numbness and chest pain; troponins were negative, blood work was significant for mildly elevated glucose, CT head revealed no acute abnormality, expected evolutionary change in the patient's large left MCA territory infarct as seen on prior studies is noted. He was subsequently discharged.  He had a visit to cardiology yesterday for evaluation of loop recorder.  Today he is accompanied by his wife and mother who report that the patient has been combative and refusing to take his medications. The patient goes on to inform me that "he cannot take over 400 pills" he complains of pain on the left side of his face and left frontal forehead and pain also on his right side of his thorax radiating to his back which is not responding to his current medications but I have pointed out to him that he has not been compliant with his medications either. Family has noticed him stretching his right upper extremity in an attempt to ease the pain.  Review of his chart indicates he had an A1c of 6.5 and new diagnosis of diabetes mellitus which the family was unaware of.  Outpatient Prescriptions Prior to Visit  Medication Sig Dispense Refill  . aspirin 325 MG tablet Take 1 tablet (325 mg total) by mouth daily. 90 tablet 1  . atorvastatin (LIPITOR) 40 MG tablet Take 1 tablet (40 mg total) by mouth daily at 6 PM. 90 tablet 1  . lisinopril (PRINIVIL,ZESTRIL) 2.5 MG  tablet Take 1 tablet (2.5 mg total) by mouth at bedtime. 90 tablet 1  . LORazepam (ATIVAN) 1 MG tablet Take 1 tablet (1 mg total) by mouth every 6 (six) hours as needed for anxiety. 20 tablet 0  . multivitamin (PROSIGHT) TABS tablet Take 1 tablet by mouth daily. 30 each 0  . nicotine (NICODERM CQ - DOSED IN MG/24 HOURS) 14 mg/24hr patch Place 1 patch (14 mg total) onto the skin daily. 28 patch 0  . polyethylene glycol (MIRALAX / GLYCOLAX) packet Take 17 g by mouth 2 (two) times daily. 14 each 0  . senna-docusate (SENOKOT-S) 8.6-50 MG tablet Take 2 tablets by mouth 2 (two) times daily. 60 tablet 0  . thiamine 100 MG tablet Take 1 tablet (100 mg total) by mouth daily. 90 tablet 1  . traMADol (ULTRAM) 50 MG tablet Take 1 tablet (50 mg total) by mouth every 6 (six) hours. For headaches. Can decrease to three times a day in a couple of weeks as headaches improve. 120 tablet 0  . topiramate (TOPAMAX) 25 MG tablet Take 1 tablet (25 mg total) by mouth at bedtime. 90 tablet 1  . albuterol-ipratropium (COMBIVENT) 18-103 MCG/ACT inhaler Inhale 1-2 puffs into the lungs every 6 (six) hours as needed for wheezing or shortness of breath. Reported on 05/23/2015     No facility-administered medications prior to visit.    ROS Review of Systems  Constitutional: Negative for activity change and appetite change.  HENT: Negative for sinus  pressure and sore throat.   Eyes: Negative for visual disturbance.  Respiratory: Negative for cough, chest tightness and shortness of breath.   Cardiovascular: Negative for chest pain and leg swelling.  Gastrointestinal: Negative for abdominal pain, diarrhea, constipation and abdominal distention.  Endocrine: Negative.   Genitourinary: Negative for dysuria.  Musculoskeletal: Positive for back pain. Negative for myalgias and joint swelling.  Skin: Negative for rash.  Allergic/Immunologic: Negative.   Neurological: Positive for headaches. Negative for weakness, light-headedness  and numbness.  Psychiatric/Behavioral: Negative for suicidal ideas and dysphoric mood.    Objective:  BP 132/91 mmHg  Pulse 91  Temp(Src) 98.5 F (36.9 C)  Resp 16  Ht  (1.676 m)  Wt 132 lb 3.2 oz (59.966 kg)  BMI 21.35 kg/m2  SpO2 96%  BP/Weight 05/23/2015 05/20/2015 05/09/2015  Systolic BP 132 132 137  Diastolic BP 91 91 87  Wt. (Lbs) 132.2 - 138  BMI 21.35 - 22.28      Physical Exam  Constitutional: He is oriented to person, place, and time. He appears well-developed and well-nourished.  HENT:  Head: Normocephalic and atraumatic.  Right Ear: External ear normal.  Left Ear: External ear normal.  Eyes: Conjunctivae and EOM are normal. Pupils are equal, round, and reactive to light.  Neck: Normal range of motion. Neck supple. No tracheal deviation present.  Cardiovascular: Normal rate, regular rhythm and normal heart sounds.   No murmur heard. Pulmonary/Chest: Effort normal and breath sounds normal. No respiratory distress. He has no wheezes. He exhibits no tenderness.  Abdominal: Soft. Bowel sounds are normal. He exhibits no mass. There is no tenderness.  Musculoskeletal: Normal range of motion. He exhibits no edema or tenderness.  Neurological: He is alert and oriented to person, place, and time.  Expressive aphasia  Skin: Skin is warm and dry.  Psychiatric: He has a normal mood and affect.     Assessment & Plan:   1. Type 2 diabetes mellitus with complication, without long-term current use of insulin (HCC) New diagnosis with A1c of 6.5 Will maintain on dietary control for now especially since he is resistant to taking medications. Will scheduled to see clinical pharmacist for diabetic teaching meanwhile educated on blood Sugar testing which he is to perform daily and I will review this at his next visit. - Glucose (CBG)  2. Asthma, mild persistent, uncomplicated Controlled. No acute exacerbation  3. Cerebral infarction due to unspecified  mechanism Currently with some behavioral issues and remains on Ativan as needed I have spoken with him about compliance with medication which he does not seem to comprehend and this will be a tough battle trying to get him to be compliant. Risk factor modification-working on smoking cessation Scheduled to see neurology in 07/04/15  4. Essential hypertension Controlled   5. Aphasia due to recent cerebral infarction Currently undergoing speech therapy.  6. New onset of headaches due to stroke Increased dose of Topamax from once daily to twice daily - topiramate (TOPAMAX) 25 MG tablet; Take 1 tablet (25 mg total) by mouth 2 (two) times daily.  Dispense: 60 tablet; Refill: 1  7. Muscle spasm Will treat right-sided pain as muscle spasms and will reassess at his next visit - tiZANidine (ZANAFLEX) 4 MG tablet; Take 1 tablet (4 mg total) by mouth every 8 (eight) hours as needed for muscle spasms.  Dispense: 90 tablet; Refill: 1  8. Type 2 diabetes mellitus without complication, without long-term current use of insulin (HCC) - [employer] (ACCU-CHEK SOFTCLIX) lancets;  Use as instructed daily.  Dispense: 1 each; Refill: 5 - glucose blood (ACCU-CHEK AVIVA) test strip; Use daily as directed.  Dispense: 30 each; Refill: 12 - Blood Glucose Monitoring Suppl (ACCU-CHEK AVIVA) device; Use as instructed daily.  Dispense: 1 each; Refill: 0   Meds ordered this encounter  Medications  . topiramate (TOPAMAX) 25 MG tablet    Sig: Take 1 tablet (25 mg total) by mouth 2 (two) times daily.    Dispense:  60 tablet    Refill:  1    Dose change  . tiZANidine (ZANAFLEX) 4 MG tablet    Sig: Take 1 tablet (4 mg total) by mouth every 8 (eight) hours as needed for muscle spasms.    Dispense:  90 tablet    Refill:  1  . [employer] (ACCU-CHEK SOFTCLIX) lancets    Sig: Use as instructed daily.    Dispense:  1 each    Refill:  5  . glucose blood (ACCU-CHEK AVIVA) test strip    Sig: Use daily as directed.     Dispense:  30 each    Refill:  12  . Blood Glucose Monitoring Suppl (ACCU-CHEK AVIVA) device    Sig: Use as instructed daily.    Dispense:  1 each    Refill:  0    Follow-up: Return in about 1 month (around 06/20/2015) for follow up on diabetes mellitus; place on St[facility] schedule for diabetic education.   Jaclyn Shaggy MD

## 2015-05-23 NOTE — Progress Notes (Signed)
Follow up Patient has expressive aphasia so wife helps with communication She reports he is having severe left sided headaches that involve the eye-Topomax and Tramadol not helpful Severe right sided back pain that radiated into his chest Wife reports depression and anxiety Looks like A1C in hospital reveals DM2 but patient not receiving treatment?

## 2015-05-23 NOTE — Patient Instructions (Signed)
Diabetes Mellitus and Food It is important for you to manage your blood sugar (glucose) level. Your blood glucose level can be greatly affected by what you eat. Eating healthier foods in the appropriate amounts throughout the day at about the same time each day will help you control your blood glucose level. It can also help slow or prevent worsening of your diabetes mellitus. Healthy eating may even help you improve the level of your blood pressure and reach or maintain a healthy weight.  General recommendations for healthful eating and cooking habits include:  Eating meals and snacks regularly. Avoid going long periods of time without eating to lose weight.  Eating a diet that consists mainly of plant-based foods, such as fruits, vegetables, nuts, legumes, and whole grains.  Using low-heat cooking methods, such as baking, instead of high-heat cooking methods, such as deep frying. Work with your dietitian to make sure you understand how to use the Nutrition Facts information on food labels. HOW CAN FOOD AFFECT ME? Carbohydrates Carbohydrates affect your blood glucose level more than any other type of food. Your dietitian will help you determine how many carbohydrates to eat at each meal and teach you how to count carbohydrates. Counting carbohydrates is important to keep your blood glucose at a healthy level, especially if you are using insulin or taking certain medicines for diabetes mellitus. Alcohol Alcohol can cause sudden decreases in blood glucose (hypoglycemia), especially if you use insulin or take certain medicines for diabetes mellitus. Hypoglycemia can be a life-threatening condition. Symptoms of hypoglycemia (sleepiness, dizziness, and disorientation) are similar to symptoms of having too much alcohol.  If your health care provider has given you approval to drink alcohol, do so in moderation and use the following guidelines:  Women should not have more than one drink per day, and men  should not have more than two drinks per day. One drink is equal to:  12 oz of beer.  5 oz of wine.  1 oz of hard liquor.  Do not drink on an empty stomach.  Keep yourself hydrated. Have water, diet soda, or unsweetened iced tea.  Regular soda, juice, and other mixers might contain a lot of carbohydrates and should be counted. WHAT FOODS ARE NOT RECOMMENDED? As you make food choices, it is important to remember that all foods are not the same. Some foods have fewer nutrients per serving than other foods, even though they might have the same number of calories or carbohydrates. It is difficult to get your body what it needs when you eat foods with fewer nutrients. Examples of foods that you should avoid that are high in calories and carbohydrates but low in nutrients include:  Trans fats (most processed foods list trans fats on the Nutrition Facts label).  Regular soda.  Juice.  Candy.  Sweets, such as cake, pie, doughnuts, and cookies.  Fried foods. WHAT FOODS CAN I EAT? Eat nutrient-rich foods, which will nourish your body and keep you healthy. The food you should eat also will depend on several factors, including:  The calories you need.  The medicines you take.  Your weight.  Your blood glucose level.  Your blood pressure level.  Your cholesterol level. You should eat a variety of foods, including:  Protein.  Lean cuts of meat.  Proteins low in saturated fats, such as fish, egg whites, and beans. Avoid processed meats.  Fruits and vegetables.  Fruits and vegetables that may help control blood glucose levels, such as apples, mangoes, and   yams.  Dairy products.  Choose fat-free or low-fat dairy products, such as milk, yogurt, and cheese.  Grains, bread, pasta, and rice.  Choose whole grain products, such as multigrain bread, whole oats, and brown rice. These foods may help control blood pressure.  Fats.  Foods containing healthful fats, such as nuts,  avocado, olive oil, canola oil, and fish. DOES EVERYONE WITH DIABETES MELLITUS HAVE THE SAME MEAL PLAN? Because every person with diabetes mellitus is different, there is not one meal plan that works for everyone. It is very important that you meet with a dietitian who will help you create a meal plan that is just right for you.   This information is not intended to replace advice given to you by your health care provider. Make sure you discuss any questions you have with your health care provider.   Document Released: 12/24/2004 Document Revised: 04/19/2014 Document Reviewed: 02/23/2013 Elsevier Interactive Patient Education 2016 [employer]  

## 2015-05-27 DIAGNOSIS — I69320 Aphasia following cerebral infarction: Secondary | ICD-10-CM | POA: Diagnosis not present

## 2015-05-27 DIAGNOSIS — M109 Gout, unspecified: Secondary | ICD-10-CM | POA: Diagnosis not present

## 2015-05-27 DIAGNOSIS — E785 Hyperlipidemia, unspecified: Secondary | ICD-10-CM | POA: Diagnosis not present

## 2015-05-28 ENCOUNTER — Encounter: Payer: Self-pay | Admitting: Pharmacist

## 2015-05-28 ENCOUNTER — Ambulatory Visit: Payer: Medicaid Other | Attending: Family Medicine | Admitting: Pharmacist

## 2015-05-28 DIAGNOSIS — E118 Type 2 diabetes mellitus with unspecified complications: Secondary | ICD-10-CM | POA: Diagnosis not present

## 2015-05-28 NOTE — Progress Notes (Signed)
S:    Patient arrives in good spirits with his wife and mother. Patient with aphasia. Presents for diabetes evaluation, education, and management at the request of Dr. Venetia Night. Patient was referred on 05/23/15.  Patient was last seen by Primary Care Provider on 05/23/15.   Patient reports Diabetes was diagnosed in 2017/   Patient denies adherence with medications.  Current diabetes medications include: none  Patient denies hypoglycemic events.  Patient reported dietary habits: Eats 3 meals/day. Patient eats a lot of sugary drinks and candy. He does not follow a particular diet.  Patient reported exercise habits: He does not exercise.   Patient denies nocturia.  Patient reports neuropathy. Patient denies visual changes. Patient denies self foot exams.    O:  Lab Results  Component Value Date   HGBA1C 6.5* 04/27/2015   There were no vitals filed for this visit.  Home fasting CBG: 80s-110s  2 hour post-prandial/random CBG: 100s-150s.  A/P: Diabetes newly diagnosed currently controlled based on A1c of 6.5. Patient denies hypoglycemic events and is able to verbalize appropriate hypoglycemia management plan. Patient denies adherence with medication. Control is suboptimal due to dietary indiscretion and sedentary lifestyle, as well as noncompliance with his medications.  Reviewed what diabetes is, what A1c is, goal blood glucose levels, the complications of diabetes, as well as dietary and lifestyle changes with a focus on cutting back on carbohydrate intake. Answered patient, wife, and mother's questions. Patient has aphasia and difficulty understanding the information but wife and mother verbalized understanding. Will likely need re-education frequently as patient seems unwilling to make many changes at this time.  Also discuss foods to avoid in gout. Patient and family verbalized understanding.  Next A1C anticipated April 2017.    Written patient instructions provided.  Total time in  face to face counseling 30 minutes.   Follow up with Dr. Venetia Night as directed

## 2015-05-28 NOTE — Patient Instructions (Signed)
Thank you for coming to see me  I know this is a lot of information so please let us know if you have questions!  Follow up with Dr. Venetia Night  Basic Carbohydrate Counting for Diabetes Mellitus Carbohydrate counting is a method for keeping track of the amount of carbohydrates you eat. Eating carbohydrates naturally increases the level of sugar (glucose) in your blood, so it is important for you to know the amount that is okay for you to have in every meal. Carbohydrate counting helps keep the level of glucose in your blood within normal limits. The amount of carbohydrates allowed is different for every person. A dietitian can help you calculate the amount that is right for you. Once you know the amount of carbohydrates you can have, you can count the carbohydrates in the foods you want to eat. Carbohydrates are found in the following foods:  Grains, such as breads and cereals.  Dried beans and soy products.  Starchy vegetables, such as potatoes, peas, and corn.  Fruit and fruit juices.  Milk and yogurt.  Sweets and snack foods, such as cake, cookies, candy, chips, soft drinks, and fruit drinks. CARBOHYDRATE COUNTING There are two ways to count the carbohydrates in your food. You can use either of the methods or a combination of both. Reading the "Nutrition Facts" on Packaged Food The "Nutrition Facts" is an area that is included on the labels of almost all packaged food and beverages in the [place]. It includes the serving size of that food or beverage and information about the nutrients in each serving of the food, including the grams (g) of carbohydrate per serving.  Decide the number of servings of this food or beverage that you will be able to eat or drink. Multiply that number of servings by the number of grams of carbohydrate that is listed on the label for that serving. The total will be the amount of carbohydrates you will be having when you eat or drink this food or  beverage. Learning Standard Serving Sizes of Food When you eat food that is not packaged or does not include "Nutrition Facts" on the label, you need to measure the servings in order to count the amount of carbohydrates.A serving of most carbohydrate-rich foods contains about 15 g of carbohydrates. The following list includes serving sizes of carbohydrate-rich foods that provide 15 g ofcarbohydrate per serving:   1 slice of bread (1 oz) or 1 six-inch tortilla.    of a hamburger bun or English muffin.  4-6 crackers.   cup unsweetened dry cereal.    cup hot cereal.   cup rice or pasta.    cup mashed potatoes or  of a large baked potato.  1 cup fresh fruit or one small piece of fruit.    cup canned or frozen fruit or fruit juice.  1 cup milk.   cup plain fat-free yogurt or yogurt sweetened with artificial sweeteners.   cup cooked dried beans or starchy vegetable, such as peas, corn, or potatoes.  Decide the number of standard-size servings that you will eat. Multiply that number of servings by 15 (the grams of carbohydrates in that serving). For example, if you eat 2 cups of strawberries, you will have eaten 2 servings and 30 g of carbohydrates (2 servings x 15 g = 30 g). For foods such as soups and casseroles, in which more than one food is mixed in, you will need to count the carbohydrates in each food that is included.  EXAMPLE OF CARBOHYDRATE COUNTING Sample Dinner  3 oz chicken breast.   cup of brown rice.   cup of corn.  1 cup milk.   1 cup strawberries with sugar-free whipped topping.  Carbohydrate Calculation Step 1: Identify the foods that contain carbohydrates:   Rice.   Corn.   Milk.   Strawberries. Step 2:Calculate the number of servings eaten of each:   2 servings of rice.   1 serving of corn.   1 serving of milk.   1 serving of strawberries. Step 3: Multiply each of those number of servings by 15 g:   2 servings of  rice x 15 g = 30 g.   1 serving of corn x 15 g = 15 g.   1 serving of milk x 15 g = 15 g.   1 serving of strawberries x 15 g = 15 g. Step 4: Add together all of the amounts to find the total grams of carbohydrates eaten: 30 g + 15 g + 15 g + 15 g = 75 g.   This information is not intended to replace advice given to you by your health care provider. Make sure you discuss any questions you have with your health care provider.   Document Released: 03/29/2005 Document Revised: 04/19/2014 Document Reviewed: 02/23/2013 Elsevier Interactive Patient Education 2016 Elsevier Inc.  Blood Glucose Monitoring, Adult Monitoring your blood glucose (also know as blood sugar) helps you to manage your diabetes. It also helps you and your health care provider monitor your diabetes and determine how well your treatment plan is working. WHY SHOULD YOU MONITOR YOUR BLOOD GLUCOSE?  It can help you understand how food, exercise, and medicine affect your blood glucose.  It allows you to know what your blood glucose is at any given moment. You can quickly tell if you are having low blood glucose (hypoglycemia) or high blood glucose (hyperglycemia).  It can help you and your health care provider know how to adjust your medicines.  It can help you understand how to manage an illness or adjust medicine for exercise. WHEN SHOULD YOU TEST? Your health care provider will help you decide how often you should check your blood glucose. This may depend on the type of diabetes you have, your diabetes control, or the types of medicines you are taking. Be sure to write down all of your blood glucose readings so that this information can be reviewed with your health care provider. See below for examples of testing times that your health care provider may suggest. Type 1 Diabetes  Test at least 2 times per day if your diabetes is well controlled, if you are using an insulin pump, or if you perform multiple daily  injections.  If your diabetes is not well controlled or if you are sick, you may need to test more often.  It is a good idea to also test:  Before every insulin injection.  Before and after exercise.  Between meals and 2 hours after a meal.  Occasionally between 2:00 a.m. and 3:00 a.m. Type 2 Diabetes  If you are taking insulin, test at least 2 times per day. However, it is best to test before every insulin injection.  If you take medicines by mouth (orally), test 2 times a day.  If you are on a controlled diet, test once a day.  If your diabetes is not well controlled or if you are sick, you may need to monitor more often. HOW TO MONITOR YOUR BLOOD  GLUCOSE Supplies Needed  Blood glucose meter.  Test strips for your meter. Each meter has its own strips. You must use the strips that go with your own meter.  A pricking needle (lancet).  A device that holds the lancet (lancing device).  A journal or log book to write down your results. Procedure  Wash your hands with soap and water. Alcohol is not preferred.  Prick the side of your finger (not the tip) with the lancet.  Gently milk the finger until a small drop of blood appears.  Follow the instructions that come with your meter for inserting the test strip, applying blood to the strip, and using your blood glucose meter. Other Areas to Get Blood for Testing Some meters allow you to use other areas of your body (other than your finger) to test your blood. These areas are called alternative sites. The most common alternative sites are:  The forearm.  The thigh.  The back area of the lower leg.  The palm of the hand. The blood flow in these areas is slower. Therefore, the blood glucose values you get may be delayed, and the numbers are different from what you would get from your fingers. Do not use alternative sites if you think you are having hypoglycemia. Your reading will not be accurate. Always use a finger if you  are having hypoglycemia. Also, if you cannot feel your lows (hypoglycemia unawareness), always use your fingers for your blood glucose checks. ADDITIONAL TIPS FOR GLUCOSE MONITORING  Do not reuse lancets.  Always carry your supplies with you.  All blood glucose meters have a 24-hour "hotline" number to call if you have questions or need help.  Adjust (calibrate) your blood glucose meter with a control solution after finishing a few boxes of strips. BLOOD GLUCOSE RECORD KEEPING It is a good idea to keep a daily record or log of your blood glucose readings. Most glucose meters, if not all, keep your glucose records stored in the meter. Some meters come with the ability to download your records to your home computer. Keeping a record of your blood glucose readings is especially helpful if you are wanting to look for patterns. Make notes to go along with the blood glucose readings because you might forget what happened at that exact time. Keeping good records helps you and your health care provider to work together to achieve good diabetes management.    This information is not intended to replace advice given to you by your health care provider. Make sure you discuss any questions you have with your health care provider.   Document Released: 04/01/2003 Document Revised: 04/19/2014 Document Reviewed: 08/21/2012 Elsevier Interactive Patient Education 2016 [employer].  Diabetes and Exercise Exercising regularly is important. It is not just about losing weight. It has many health benefits, such as:  Improving your overall fitness, flexibility, and endurance.  Increasing your bone density.  Helping with weight control.  Decreasing your body fat.  Increasing your muscle strength.  Reducing stress and tension.  Improving your overall health. People with diabetes who exercise gain additional benefits because exercise:  Reduces appetite.  Improves the body's use of blood sugar  (glucose).  Helps lower or control blood glucose.  Decreases blood pressure.  Helps control blood lipids (such as cholesterol and triglycerides).  Improves the body's use of the hormone insulin by:  Increasing the body's insulin sensitivity.  Reducing the body's insulin needs.  Decreases the risk for heart disease because exercising:  Lowers  cholesterol and triglycerides levels.  Increases the levels of good cholesterol (such as high-density lipoproteins [HDL]) in the body.  Lowers blood glucose levels. YOUR ACTIVITY PLAN  Choose an activity that you enjoy, and set realistic goals. To exercise safely, you should begin practicing any new physical activity slowly, and gradually increase the intensity of the exercise over time. Your health care provider or diabetes educator can help create an activity plan that works for you. General recommendations include:  Encouraging children to engage in at least 60 minutes of physical activity each day.  Stretching and performing strength training exercises, such as yoga or weight lifting, at least 2 times per week.  Performing a total of at least 150 minutes of moderate-intensity exercise each week, such as brisk walking or water aerobics.  Exercising at least 3 days per week, making sure you allow no more than 2 consecutive days to pass without exercising.  Avoiding long periods of inactivity (90 minutes or more). When you have to spend an extended period of time sitting down, take frequent breaks to walk or stretch. RECOMMENDATIONS FOR EXERCISING WITH TYPE 1 OR TYPE 2 DIABETES   Check your blood glucose before exercising. If blood glucose levels are greater than 240 mg/dL, check for urine ketones. Do not exercise if ketones are present.  Avoid injecting insulin into areas of the body that are going to be exercised. For example, avoid injecting insulin into:  The arms when playing tennis.  The legs when jogging.  Keep a record  of:  Food intake before and after you exercise.  Expected peak times of insulin action.  Blood glucose levels before and after you exercise.  The type and amount of exercise you have done.  Review your records with your health care provider. Your health care provider will help you to develop guidelines for adjusting food intake and insulin amounts before and after exercising.  If you take insulin or oral hypoglycemic agents, watch for signs and symptoms of hypoglycemia. They include:  Dizziness.  Shaking.  Sweating.  Chills.  Confusion.  Drink plenty of water while you exercise to prevent dehydration or heat stroke. Body water is lost during exercise and must be replaced.  Talk to your health care provider before starting an exercise program to make sure it is safe for you. Remember, almost any type of activity is better than none.   This information is not intended to replace advice given to you by your health care provider. Make sure you discuss any questions you have with your health care provider.   Document Released: 06/19/2003 Document Revised: 08/13/2014 Document Reviewed: 09/05/2012 Elsevier Interactive Patient Education [employer]

## 2015-05-30 ENCOUNTER — Telehealth: Payer: Self-pay | Admitting: Physical Medicine & Rehabilitation

## 2015-05-30 ENCOUNTER — Ambulatory Visit (INDEPENDENT_AMBULATORY_CARE_PROVIDER_SITE_OTHER): Payer: Medicaid Other | Admitting: *Deleted

## 2015-05-30 DIAGNOSIS — Z95818 Presence of other cardiac implants and grafts: Secondary | ICD-10-CM

## 2015-05-30 NOTE — Telephone Encounter (Signed)
Amy McKey ST with [facility] went out to see patient and he is complaining of pain on left side especially left ear.  It is swollen and a lot of tenderness in that ear.   Please advise.  Her number is [phone].

## 2015-05-30 NOTE — Telephone Encounter (Signed)
Per Dr Wynn Banker he needs to follow up with PCP.  I let Amy know.

## 2015-05-30 NOTE — Telephone Encounter (Signed)
This has been coming and going since stroke and it came on last night.  He has pain and small amt of swelling, possible tinnitis with it too. Not able to sleep. Communication makes defining more difficult. Please advise

## 2015-06-04 NOTE — Progress Notes (Signed)
[facility] Summary Report / Loop Recorder 

## 2015-06-06 ENCOUNTER — Telehealth: Payer: Self-pay | Admitting: Family Medicine

## 2015-06-06 ENCOUNTER — Encounter: Payer: Self-pay | Admitting: Physical Medicine & Rehabilitation

## 2015-06-06 ENCOUNTER — Ambulatory Visit (HOSPITAL_BASED_OUTPATIENT_CLINIC_OR_DEPARTMENT_OTHER): Payer: Medicaid Other | Admitting: Physical Medicine & Rehabilitation

## 2015-06-06 ENCOUNTER — Encounter: Payer: Medicaid Other | Attending: Physical Medicine & Rehabilitation

## 2015-06-06 VITALS — BP 114/78 | HR 96 | Resp 14

## 2015-06-06 DIAGNOSIS — Z87891 Personal history of nicotine dependence: Secondary | ICD-10-CM | POA: Insufficient documentation

## 2015-06-06 DIAGNOSIS — E785 Hyperlipidemia, unspecified: Secondary | ICD-10-CM | POA: Diagnosis not present

## 2015-06-06 DIAGNOSIS — R51 Headache: Secondary | ICD-10-CM | POA: Diagnosis not present

## 2015-06-06 DIAGNOSIS — I69320 Aphasia following cerebral infarction: Secondary | ICD-10-CM | POA: Diagnosis not present

## 2015-06-06 DIAGNOSIS — E119 Type 2 diabetes mellitus without complications: Secondary | ICD-10-CM | POA: Insufficient documentation

## 2015-06-06 DIAGNOSIS — M109 Gout, unspecified: Secondary | ICD-10-CM | POA: Diagnosis not present

## 2015-06-06 DIAGNOSIS — R519 Headache, unspecified: Secondary | ICD-10-CM

## 2015-06-06 MED ORDER — NEOMYCIN-POLYMYXIN-HC 1 % OT SOLN: 3.0000 [drp] | Freq: Four times a day (QID) | OTIC | Status: DC

## 2015-06-06 MED ORDER — TOPIRAMATE 50 MG PO TABS: 50.0000 mg | ORAL_TABLET | Freq: Two times a day (BID) | ORAL | Status: DC

## 2015-06-06 MED FILL — NEO/POLYMYXIN/HC EAR SUSP: 3.5-10000-1 | 10 days supply | Qty: 10 | Fill #0

## 2015-06-06 MED FILL — ATORVASTATIN 40 MG TABLET: 40 | 30 days supply | Qty: 30 | Fill #0

## 2015-06-06 MED FILL — TOPIRAMATE 50 MG TABLET: 50 | 30 days supply | Qty: 60 | Fill #0

## 2015-06-06 MED FILL — LISINOPRIL 2.5 MG TABLET: 2.5 | 30 days supply | Qty: 30 | Fill #0

## 2015-06-06 NOTE — Progress Notes (Signed)
Subjective:    Patient ID: Gary Barker, male    DOB: 23-Aug-1965, 50 y.o.   MRN: [id number] 50 y.o. male with history of tobacco use, recent gout flare affecting bilateral thumbs and heavy alcohol use who was admitted on 04/26/15 am with right sided weakness and confusion. MRI/MRA brain done revealing extensive areas of non-hemorrhagic infarct throughout much of L-MCA territory sparing basal ganglia, focal high grade stenosis or filling defect in L-MCA distal M1 segment and approximately 50% mid basilar stenosis. TEE with EF 55-60% and no wall abnormality. No thrombus or PFO seen and loop recorder placed by Dr. Graciela Husbands. Cognitive evaluation shows mixed transcortical aphasia impacting expressive and receptive language with 25% accuracy for basic Y/N questions and ability to follow commands. He continues to have significant cognitive deficits impacting balance, gait and ability  HPI Has followed up with primary care physician on 2 occasions. Newly diagnosed diabetic. Has followed up with cardiology for loop recorder post op check. In addition went to the ER for feeling Friday as well as headaches. He has been taking Topamax for headaches Patient has no falls at home Patient is accompanied by his wife as well as his mother. His wife does not speak much [place]. His mother is fluent. Patient reportedly complains of left-sided facial pain. Patient is severely a phasic, mainly receptive. He has difficulty following commands and answering questions. Patient's mother also reports that the patient is wanting to have sexual relations with his wife however wife is afraid to because of fear of another stroke. We discussed this. In addition patient wants to drive however I did discuss that I do not recommend that he drive at this time. They have 2 medications that they're running out of one is his simvastatin and the other is lisinopril, advised them to call primary care physician on this Pain Inventory Average Pain  pt unable to give pain score Pain Right Now pt unable to give pain score My pain is pt unable to describe pain  In the last 24 hours, has pain interfered with the following? General activity 5 Relation with others 4 Enjoyment of life no selection What TIME of day is your pain at its worst? pt unable to describe Sleep (in general) Poor  Pain is worse with: pt not able to describe Pain improves with: medication Relief from Meds: 2  Mobility walk without assistance ability to climb steps?  yes do you drive?  no  Function disabled: date disabled . I need assistance with the following:  meal prep, household duties and shopping  Neuro/Psych spasms confusion depression anxiety  Prior Studies hospital follow up  Physicians involved in your care hospital follow up   Family History  Problem Relation Age of Onset  . Seizures Mother   . Diabetes Brother   . Hepatitis C Mother   . Fibromyalgia Mother    Social History   Social History  . Marital Status: Married    Spouse Name: N/A  . Number of Children: N/A  . Years of Education: N/A   Social History Main Topics  . Smoking status: Former Smoker -- 0.50 packs/day for 30 years    Types: Cigarettes    Quit date: 04/22/2015  . Smokeless tobacco: Never Used  . Alcohol Use: No  . Drug Use: No  . Sexual Activity: Not Asked   Other Topics Concern  . None   Social History Narrative   Past Surgical History  Procedure Laterality Date  . Tee without cardioversion N/A  04/29/2015    Procedure: TRANSESOPHAGEAL ECHOCARDIOGRAM (TEE);  Surgeon: Jake Bathe, MD;  Location: [facility] ENDOSCOPY;  Service: Cardiovascular;  Laterality: N/A;  . Ep implantable device N/A 04/30/2015    Procedure: Loop Recorder Insertion;  Surgeon: Duke Salvia, MD;  Location: [facility] INVASIVE CV LAB;  Service: Cardiovascular;  Laterality: N/A;   Past Medical History  Diagnosis Date  . Gout attack 04/2015  . Hyperlipidemia   . Stroke [facility]) Apr 25 2015  .  Diabetes mellitus (HCC) 05/23/2015   BP 114/78 mmHg  Pulse 96  Resp 14  SpO2 98%  Opioid Risk Score:   Fall Risk Score:  `1  Depression screen PHQ 2/9  Depression screen [facility] 2/9 05/23/2015 05/09/2015  Decreased Interest 3 0  Down, Depressed, Hopeless 3 0  PHQ - 2 Score 6 0  Altered sleeping 3 -  Tired, decreased energy 1 -  Change in appetite 0 -  Feeling bad or failure about yourself  3 -  Trouble concentrating 3 -  Moving slowly or fidgety/restless 0 -  Suicidal thoughts 0 -  PHQ-9 Score 16 -     Review of Systems  All other systems reviewed and are negative.      Objective:   Physical Exam  Constitutional: He is oriented to person, place, and time. He appears well-developed and well-nourished. No distress.  HENT:  Head: Normocephalic and atraumatic.  Right Ear: No drainage. No foreign bodies. Tympanic membrane is not injected and not bulging. No middle ear effusion.  Left Ear: No drainage. No foreign bodies. Tympanic membrane is not injected and not bulging.  No middle ear effusion.  Mouth/Throat: Uvula is midline, oropharynx is clear and moist and mucous membranes are normal. No oral lesions. No uvula swelling.  Left external auditory canal erythema as well as tenderness during examination  Neck: Trachea normal. Neck supple. No JVD present. No thyroid mass and no thyromegaly present.  Neurological: He is alert and oriented to person, place, and time. Coordination and gait normal.  Cannot assess sensation due to aphasia  Difficulty following commands for manual muscle testing however his strength is normal bilateral upper and lower limbs  Nursing note and vitals reviewed.         Assessment & Plan:  1. Left posterior branch MCA stroke causing receptive aphasia. I explained what this means to his mother as well as his wife. We discussed typical time frame of recovery of up to 1 year. Also discussed thattherapy will be needed however no longer requiring PT and OT.  After he finishes up with home health speech she would benefit from outpatient speech therapy  2. Post stroke headaches starting on Topamax 25 twice a day I do think he may benefit from a higher dose will increase to 50 twice a day. Some inconsistent reports of which medicines are helpful. At one point it. Tramadol was very helpful but at another physician office visit this was reported as ineffective. She will follow up with neurology in 1 month  3. Left otitis externa will prescribe Corticosporin Otic drops and he will follow-up with his primary care physician in one week  Spent time discussing the patient's stroke as well as his deficits as well as activity restrictions. We discussed no driving We discussed that he is safe to go back to sexual activity with his wife  He will follow up with neurology in 1 month He will follow up with primary care physician in one week Has cardiology follow-up in  approximately 1.5 months Physical medicine and rehabilitation follow-up on an as-needed basis

## 2015-06-06 NOTE — Telephone Encounter (Signed)
Routing to MD.

## 2015-06-06 NOTE — Telephone Encounter (Signed)
Pt. Came in requesting a med refill on LORazepam (ATIVAN) 1 MG tablet. Pt. Mother stated that this medication is the only thing that keeps him calm. Please f/u with pt.

## 2015-06-06 NOTE — Telephone Encounter (Signed)
He will have to get in touch with his neurologist for refill.

## 2015-06-06 NOTE — Patient Instructions (Addendum)
No driving, Neurology can address this with you next month  May resume sexual activity  Use eardrops and ask your primary doctor to recheck your ears at next visit  Increase Topamax to 50 mg twice a day. Follow-up with neurology next month to see what other treatments might be needed if the headaches do not improve  He'll not need to see me back

## 2015-06-09 ENCOUNTER — Telehealth: Payer: Self-pay | Admitting: Neurology

## 2015-06-09 NOTE — Telephone Encounter (Signed)
Patient handed phone to wife and RN explained that he would need to contact his neurologist to get a refill on ativan.  She verbalized understanding.

## 2015-06-09 NOTE — Telephone Encounter (Signed)
Patient has a follow up appt with Dr.Sethi in April 2017. Pt has not been by [facility] doctor. Pt will have to see PCP about ativan refills.

## 2015-06-09 NOTE — Telephone Encounter (Signed)
Pt's mother, Gary Barker called and says the pt is becoming violent in some ways, very anxious like behavior. She says the pt was seen in the ER and was given LORazepam (ATIVAN) 1 MG tablet. She wants to know if a rx can be written for the pt.  She is not on DPR. And she understands that our office will not be able to speak to her about his care.

## 2015-06-13 ENCOUNTER — Encounter: Payer: Self-pay | Admitting: Family Medicine

## 2015-06-13 ENCOUNTER — Encounter: Payer: Self-pay | Admitting: Cardiology

## 2015-06-13 ENCOUNTER — Ambulatory Visit: Payer: Medicaid Other | Attending: Family Medicine | Admitting: Family Medicine

## 2015-06-13 VITALS — BP 114/80 | HR 85 | Temp 98.1°F | Resp 15 | Ht 66.0 in | Wt 132.0 lb

## 2015-06-13 DIAGNOSIS — F919 Conduct disorder, unspecified: Secondary | ICD-10-CM | POA: Diagnosis not present

## 2015-06-13 DIAGNOSIS — E119 Type 2 diabetes mellitus without complications: Secondary | ICD-10-CM | POA: Insufficient documentation

## 2015-06-13 DIAGNOSIS — R4182 Altered mental status, unspecified: Secondary | ICD-10-CM | POA: Insufficient documentation

## 2015-06-13 DIAGNOSIS — Z79899 Other long term (current) drug therapy: Secondary | ICD-10-CM | POA: Diagnosis not present

## 2015-06-13 DIAGNOSIS — R51 Headache: Secondary | ICD-10-CM | POA: Diagnosis not present

## 2015-06-13 DIAGNOSIS — Z7982 Long term (current) use of aspirin: Secondary | ICD-10-CM | POA: Insufficient documentation

## 2015-06-13 DIAGNOSIS — H9202 Otalgia, left ear: Secondary | ICD-10-CM | POA: Insufficient documentation

## 2015-06-13 DIAGNOSIS — I10 Essential (primary) hypertension: Secondary | ICD-10-CM | POA: Insufficient documentation

## 2015-06-13 DIAGNOSIS — J4530 Mild persistent asthma, uncomplicated: Secondary | ICD-10-CM | POA: Insufficient documentation

## 2015-06-13 DIAGNOSIS — R4701 Aphasia: Secondary | ICD-10-CM | POA: Diagnosis not present

## 2015-06-13 DIAGNOSIS — I63312 Cerebral infarction due to thrombosis of left middle cerebral artery: Secondary | ICD-10-CM | POA: Diagnosis not present

## 2015-06-13 DIAGNOSIS — Z8673 Personal history of transient ischemic attack (TIA), and cerebral infarction without residual deficits: Secondary | ICD-10-CM | POA: Insufficient documentation

## 2015-06-13 DIAGNOSIS — E785 Hyperlipidemia, unspecified: Secondary | ICD-10-CM | POA: Insufficient documentation

## 2015-06-13 DIAGNOSIS — R519 Headache, unspecified: Secondary | ICD-10-CM

## 2015-06-13 LAB — GLUCOSE, POCT (MANUAL RESULT ENTRY): POC Glucose: 137 mg/dl — AB (ref 70–99)

## 2015-06-13 MED ORDER — LORAZEPAM 0.5 MG PO TABS: 0.5000 mg | ORAL_TABLET | Freq: Every day | ORAL | Status: DC

## 2015-06-13 MED ORDER — ACCU-CHEK SOFTCLIX LANCET DEV MISC: Status: DC

## 2015-06-13 MED ORDER — GLUCOSE BLOOD VI STRP: ORAL_STRIP | Status: DC

## 2015-06-13 MED FILL — LORazepam 0.5 MG TABS: 0.5 | 30 days supply | Qty: 30 | Fill #0

## 2015-06-13 NOTE — Patient Instructions (Signed)
La diabetes mellitus y los alimentos (Diabetes Mellitus and Food) Es importante que controle su nivel de azcar en la sangre (glucosa). El nivel de glucosa en sangre depende en gran medida de lo que usted come. Comer alimentos saludables en las cantidades adecuadas a lo largo del da, aproximadamente a la misma hora todos los das, lo ayudar a controlar su nivel de glucosa en sangre. Tambin puede ayudarlo a retrasar o evitar el empeoramiento de la diabetes mellitus. Comer de manera saludable incluso puede ayudarlo a mejorar el nivel de presin arterial y a alcanzar o mantener un peso saludable.  Entre las recomendaciones generales para alimentarse y cocinar los alimentos de forma saludable, se incluyen las siguientes:  Respetar las comidas principales y comer colaciones con regularidad. Evitar pasar largos perodos sin comer con el fin de perder peso.  Seguir una dieta que consista principalmente en alimentos de origen vegetal, como frutas, vegetales, frutos secos, legumbres y cereales integrales.  Utilizar mtodos de coccin a baja temperatura, como hornear, en lugar de mtodos de coccin a alta temperatura, como frer en abundante aceite. Trabaje con el nutricionista para aprender a usar la informacin nutricional de las etiquetas de los alimentos. CMO PUEDEN AFECTARME LOS ALIMENTOS? Carbohidratos Los carbohidratos afectan el nivel de glucosa en sangre ms que cualquier otro tipo de alimento. El nutricionista lo ayudar a determinar cuntos carbohidratos puede consumir en cada comida y ensearle a contarlos. El recuento de carbohidratos es importante para mantener la glucosa en sangre en un nivel saludable, en especial si utiliza insulina o toma determinados medicamentos para la diabetes mellitus. Alcohol El alcohol puede provocar disminuciones sbitas de la glucosa en sangre (hipoglucemia), en especial si utiliza insulina o toma determinados medicamentos para la diabetes mellitus. La  hipoglucemia es una afeccin que puede poner en peligro la vida. Los sntomas de la hipoglucemia (somnolencia, mareos y desorientacin) son similares a los sntomas de haber consumido mucho alcohol.  Si el mdico lo autoriza a beber alcohol, hgalo con moderacin y siga estas pautas:  Las mujeres no deben beber ms de un trago por da, y los hombres no deben beber ms de dos tragos por da. Un trago es igual a:  12 onzas (355 ml) de cerveza  5 onzas de vino (150 ml) de vino  1,5onzas (45ml) de bebidas espirituosas  No beba con el estmago vaco.  Mantngase hidratado. Beba agua, gaseosas dietticas o t helado sin azcar.  Las gaseosas comunes, los jugos y otros refrescos podran contener muchos carbohidratos y se deben contar. QU ALIMENTOS NO SE RECOMIENDAN? Cuando haga las elecciones de alimentos, es importante que recuerde que todos los alimentos son distintos. Algunos tienen menos nutrientes que otros por porcin, aunque podran tener la misma cantidad de caloras o carbohidratos. Es difcil darle al cuerpo lo que necesita cuando consume alimentos con menos nutrientes. Estos son algunos ejemplos de alimentos que debera evitar ya que contienen muchas caloras y carbohidratos, pero pocos nutrientes:  Grasas trans (la mayora de los alimentos procesados incluyen grasas trans en la etiqueta de Informacin nutricional).  Gaseosas comunes.  Jugos.  Caramelos.  Dulces, como tortas, pasteles, rosquillas y galletas.  Comidas fritas. QU ALIMENTOS PUEDO COMER? Consuma alimentos ricos en nutrientes, que nutrirn el cuerpo y lo mantendrn saludable. Los alimentos que debe comer tambin dependern de varios factores, como:  Las caloras que necesita.  Los medicamentos que toma.  Su peso.  El nivel de glucosa en sangre.  El nivel de presin arterial.  El nivel de colesterol.   Debe consumir una amplia variedad de alimentos, por ejemplo:  Protenas.  Cortes de carne  magros.  Protenas con bajo contenido de grasas saturadas, como pescado, clara de huevo y frijoles. Evite las carnes procesadas.  Frutas y vegetales.  Frutas y vegetales que pueden ayudar a controlar los niveles sanguneos de glucosa, como manzanas, mangos y batatas.  Productos lcteos.  Elija productos lcteos sin grasa o con bajo contenido de grasa, como leche, yogur y queso.  Cereales, panes, pastas y arroz.  Elija cereales integrales, como panes multicereales, avena en grano y arroz integral. Estos alimentos pueden ayudar a controlar la presin arterial.  Grasas.  Alimentos que contengan grasas saludables, como frutos secos, aguacate, aceite de oliva, aceite de canola y pescado. TODOS LOS QUE PADECEN DIABETES MELLITUS TIENEN EL MISMO PLAN DE COMIDAS? Dado que todas las personas que padecen diabetes mellitus son distintas, no hay un solo plan de comidas que funcione para todos. Es muy importante que se rena con un nutricionista que lo ayudar a crear un plan de comidas adecuado para usted.   Esta informacin no tiene como fin reemplazar el consejo del mdico. Asegrese de hacerle al mdico cualquier pregunta que tenga.   Document Released: 07/06/2007 Document Revised: 04/19/2014 Elsevier Interactive Patient Education 2016 Elsevier Inc.  

## 2015-06-13 NOTE — Progress Notes (Signed)
Patient here for follow up Wife asking for refill on his Ativan and reports his combativeness has gotten worse

## 2015-06-13 NOTE — Progress Notes (Signed)
Subjective:  Patient ID: Gary Barker, male    DOB: June 08, 1965  Age: 50 y.o. MRN: [id number]  CC: Follow-up   HPI Gary Barker is a 50 year old male with a history of newly diagnosed diabetes mellitus (A1c 6.5 currently on diet control), hypertension, hyperlipidemia, tobacco abuse with recent hospitalization for CVA secondary to large left MCA territory infarct after he had presented with right-sided weakness, globally aphasia, altered mental status and did not receive TPA due to late presentation.  Since his last office visit he has been seen by physical medicine and rehabilitation- Dr Wynn Banker and had complained of left ear pain. He received Corticosporin optic drops for his left otitis externa and reports improvement in symptoms with no pain at this time. Topamax dose also increased from 25 to 50 mg twice daily and he reports improvement in headaches.  Wife is requesting a refill of lorazepam which they received at the emergency room  which keeps the patient calm otherwise he gets agitated and has behavioral disturbances; she notices that he needs it more towards bedtime. He has not been to see his neurologist yet and has an upcoming appointment with Dr Pearlean Brownie of [facility] next month.  Outpatient Prescriptions Prior to Visit  Medication Sig Dispense Refill  . aspirin 325 MG tablet Take 1 tablet (325 mg total) by mouth daily. 90 tablet 1  . atorvastatin (LIPITOR) 40 MG tablet Take 1 tablet (40 mg total) by mouth daily at 6 PM. 90 tablet 1  . Blood Glucose Monitoring Suppl (ACCU-CHEK AVIVA) device Use as instructed daily. 1 each 0  . Ipratropium-Albuterol (COMBIVENT RESPIMAT) 20-100 MCG/ACT AERS respimat Inhale 1 puff into the lungs every 6 (six) hours. 1 Inhaler 3  . lisinopril (PRINIVIL,ZESTRIL) 2.5 MG tablet Take 1 tablet (2.5 mg total) by mouth at bedtime. 90 tablet 1  . multivitamin (PROSIGHT) TABS tablet Take 1 tablet by mouth daily. 30 each 0  .  NEOMYCIN-POLYMYXIN-HYDROCORTISONE (CORTISPORIN) 1 % SOLN otic solution Place 3 drops into the left ear 4 (four) times daily. 10 mL 0  . nicotine (NICODERM CQ - DOSED IN MG/24 HOURS) 14 mg/24hr patch Place 1 patch (14 mg total) onto the skin daily. 28 patch 0  . polyethylene glycol (MIRALAX / GLYCOLAX) packet Take 17 g by mouth 2 (two) times daily. 14 each 0  . senna-docusate (SENOKOT-S) 8.6-50 MG tablet Take 2 tablets by mouth 2 (two) times daily. 60 tablet 0  . thiamine 100 MG tablet Take 1 tablet (100 mg total) by mouth daily. 90 tablet 1  . tiZANidine (ZANAFLEX) 4 MG tablet Take 1 tablet (4 mg total) by mouth every 8 (eight) hours as needed for muscle spasms. 90 tablet 1  . topiramate (TOPAMAX) 50 MG tablet Take 1 tablet (50 mg total) by mouth 2 (two) times daily. 60 tablet 1  . traMADol (ULTRAM) 50 MG tablet Take 1 tablet (50 mg total) by mouth every 6 (six) hours. For headaches. Can decrease to three times a day in a couple of weeks as headaches improve. 120 tablet 0  . glucose blood (ACCU-CHEK AVIVA) test strip Use daily as directed. 30 each 12  . Lancet Devices (ACCU-CHEK SOFTCLIX) lancets Use as instructed daily. 1 each 5  . LORazepam (ATIVAN) 1 MG tablet Take 1 tablet (1 mg total) by mouth every 6 (six) hours as needed for anxiety. 20 tablet 0   No facility-administered medications prior to visit.    ROS Review of Systems Constitutional: Negative for activity change and  appetite change.  HENT: Negative for sinus pressure and sore throat.   Eyes: Negative for visual disturbance.  Respiratory: Negative for cough, chest tightness and shortness of breath.   Cardiovascular: Negative for chest pain and leg swelling.  Gastrointestinal: Negative for abdominal pain, diarrhea, constipation and abdominal distention.  Endocrine: Negative.   Genitourinary: Negative for dysuria.  Musculoskeletal: Positive for back pain. Negative for myalgias and joint swelling.  Skin: Negative for rash.    Allergic/Immunologic: Negative.   Neurological: Positive for headaches. Negative for weakness, light-headedness and numbness.  Psychiatric/Behavioral: Negative for suicidal ideas and dysphoric mood.  Objective:  BP 114/80 mmHg  Pulse 85  Temp(Src) 98.1 F (36.7 C)  Resp 15  Ht 5\' 6"  (1.676 m)  Wt 132 lb (59.875 kg)  BMI 21.32 kg/m2  SpO2 97%  BP/Weight 06/13/2015 06/06/2015 05/23/2015  Systolic BP 114 114 132  Diastolic BP 80 78 91  Wt. (Lbs) 132 - 132.2  BMI 21.32 - 21.35      Physical Exam Constitutional: He is oriented to person, place, and time. He appears well-developed and well-nourished.  HENT:  Head: Normocephalic and atraumatic.  Right Ear: External ear normal.  Left Ear: External ear normal.  Eyes: Conjunctivae and EOM are normal. Pupils are equal, round, and reactive to light.  Neck: Normal range of motion. Neck supple. No tracheal deviation present.  Cardiovascular: Normal rate, regular rhythm and normal heart sounds.   No murmur heard. Pulmonary/Chest: Effort normal and breath sounds normal. No respiratory distress. He has no wheezes. He exhibits no tenderness.  Abdominal: Soft. Bowel sounds are normal. He exhibits no mass. There is no tenderness.  Musculoskeletal: Normal range of motion. He exhibits no edema or tenderness.  Neurological: He is alert and oriented to person, place, and time.  Receptive and Expressive aphasia  Skin: Skin is warm and dry.  Psychiatric: He has a normal mood and affect.    Assessment & Plan:   1. Type 2 diabetes mellitus without complication, without long-term current use of insulin (HCC) A1c 6.5 Patient forgot his blood sugar log Keep blood sugar logs with fasting goals of 80-120 mg/dl, random of less than [id number] and in the event of sugars less than 60 mg/dl or greater than 096400 mg/dl please notify the clinic ASAP. It is recommended that you undergo annual eye exams and annual foot exams. Pneumovax is recommended every 5 years  before the age of 52 and once for a lifetime at or after the age of 52. - Glucose (CBG) - glucose blood (ACCU-CHEK AVIVA) test strip; Use tid as directed.  Dispense: 30 each; Refill: 12 - Lancet Devices (ACCU-CHEK SOFTCLIX) lancets; Use as instructed tid  Dispense: 1 each; Refill: 5  2. Behavior disturbance One-time refill of lorazepam and patient will need to receive subsequent refills from his neurologist.  3. Cerebral infarction due to thrombosis of left middle cerebral artery (HCC) Currently undergoing physical, occupational and speech therapy. Has an appointment with neurology next month  4. Essential hypertension Controlled  5. Asthma, mild persistent, uncomplicated Stable  6. New onset of headaches due to stroke Improved since Topamax dose was increased.   Meds ordered this encounter  Medications  . glucose blood (ACCU-CHEK AVIVA) test strip    Sig: Use tid as directed.    Dispense:  30 each    Refill:  12  . Lancet Devices (ACCU-CHEK SOFTCLIX) lancets    Sig: Use as instructed tid    Dispense:  1 each  Refill:  5  . LORazepam (ATIVAN) 0.5 MG tablet    Sig: Take 1 tablet (0.5 mg total) by mouth at bedtime. As needed    Dispense:  30 tablet    Refill:  0    Follow-up: Return in about 3 months (around 09/13/2015) for Follow-up on Diabtes mellitus.   Jaclyn Shaggy MD

## 2015-06-16 ENCOUNTER — Telehealth: Payer: Self-pay | Admitting: Family Medicine

## 2015-06-16 NOTE — Telephone Encounter (Signed)
Amy from [facility] called to get verbal orders to continue home health speech therapy. Two times a week for Three more weeks. Please f/u.

## 2015-06-16 NOTE — Telephone Encounter (Signed)
Gave verbal orders to Amy at [facility]

## 2015-06-30 ENCOUNTER — Ambulatory Visit (INDEPENDENT_AMBULATORY_CARE_PROVIDER_SITE_OTHER): Payer: Medicaid Other | Admitting: *Deleted

## 2015-06-30 DIAGNOSIS — Z95818 Presence of other cardiac implants and grafts: Secondary | ICD-10-CM

## 2015-07-01 NOTE — Progress Notes (Signed)
[facility] Summary Report / Loop Recorder 

## 2015-07-02 MED FILL — TOPIRAMATE 50 MG TABLET: 50 | 30 days supply | Qty: 60 | Fill #1

## 2015-07-02 MED FILL — LISINOPRIL 2.5 MG TABLET: 2.5 | 30 days supply | Qty: 30 | Fill #1

## 2015-07-02 MED FILL — ATORVASTATIN 40 MG TABLET: 40 | 30 days supply | Qty: 30 | Fill #1

## 2015-07-08 ENCOUNTER — Ambulatory Visit: Payer: Self-pay | Admitting: Neurology

## 2015-07-14 ENCOUNTER — Telehealth: Payer: Self-pay | Admitting: Neurology

## 2015-07-14 ENCOUNTER — Telehealth: Payer: Self-pay | Admitting: Internal Medicine

## 2015-07-14 ENCOUNTER — Telehealth: Payer: Self-pay

## 2015-07-14 ENCOUNTER — Telehealth: Payer: Self-pay | Admitting: Family Medicine

## 2015-07-14 DIAGNOSIS — IMO0002 Reserved for concepts with insufficient information to code with codable children: Secondary | ICD-10-CM

## 2015-07-14 NOTE — Telephone Encounter (Signed)
Pt's sister said speech therapy ended last week. She said he can speak some full sentences, most is not understandable. She is asking for continued speech therapy and any other program that will challenge him to help his memory. He knows who she is but sometimes he cannot verbalize it. Right now he is just sitting in the house all day without being challenged. She is wanting him to be seen while she is in town today or tomorrow before she goes back to [place] if possible. She is aware Dr Pearlean BrownieSethi is at the hospital this week. She also said his social security disability service forms have been back so they are waiting for this to be approved.

## 2015-07-14 NOTE — Telephone Encounter (Signed)
lmtcb

## 2015-07-14 NOTE — Telephone Encounter (Signed)
Patient's family member called requesting to speak to nurse regarding patient. Patient's sister is requesting a letter for patient to have assistance at home. Please f/u

## 2015-07-14 NOTE — Telephone Encounter (Signed)
Message received from Lance BoschAnna Smythe, RN noting that the patient's family is inquiring about  help for the patient at home.   Call placed to the patient's sister, Gary SorrowOlga #  [phone], who was with the patient. He gave approval for this CM to speak to [name].  Gary SorrowOlga stated that she is visiting from [place] and will be returning to her home in the near future. She said that she is concerned about her brother's progress with therapy and feels that he needs to continue with therapy if he is make any more of a recovery. She said that she is also concerned about his safety when he is alone as he is not able to read or write and is dependant on assistance for his IADLs.  She said that he has medicaid and has applied for disability but has not been approved for the disability yet,  She was interested in scheduling an appointment to have him assessed again by the doctor while she is here and is requesting an appointment for tomorrow if possible.  This CM informed her that the doctor has gone for the evening and this CM would speak to the doctor, Dr Venetia NightAmao, tomorrow and this CM will also check on  appointment availability and would  confirm his medicaid.    Gary Barker, [facility] sched[facility] confirmed that the patient's medicaid is active.  She was also able to schedule an appointment for the patient with Dr Venetia NightAmao for tomorrow, 07/15/15 @ 1145.  Call placed to [facility], sister, and notified her of the appointment for tomorrow, 07/15/15 @ 1145. She said that she would be able to transport the patient to the clinic without any difficulty. Gary SorrowOlga was very appreciative of the call.

## 2015-07-14 NOTE — Telephone Encounter (Signed)
Pt's sister called requesting if pt can continue speech therapy. She states that she feels he is making progress but now "hitting a wall." Please advise. Thanks!

## 2015-07-14 NOTE — Telephone Encounter (Signed)
Spoke to case manager Erskine SquibbJane and she will look into [facility] and contact the patient.

## 2015-07-14 NOTE — Telephone Encounter (Signed)
May continue speech therapy twice a week

## 2015-07-14 NOTE — Telephone Encounter (Signed)
Ok to speak with sister, per pt. Advised to contact neuro for further therapy needs post CVA. Explained that from Dr. Odessa FlemingKlein's standpoint, LINQ implanted secondary to cryptogenic stroke.  He does not follow speech therapy. She called Sethi's office and left message this morning, but feels she is "hitting a wall" trying to get her brother needed help.  Informed her to give physician time to advise. She verbalized understanding and agreeable to plan.

## 2015-07-14 NOTE — Telephone Encounter (Signed)
Marian SorrowOlga (Sister) is calling to see if Dr. Graciela HusbandsKlein can write a letter stating that he needs continue therapy for him after his stroke and he doesn't have any other resources and Social Services will not do anything until they have a letter from the doctor . Please call    Thanks

## 2015-07-15 ENCOUNTER — Telehealth: Payer: Self-pay

## 2015-07-15 ENCOUNTER — Ambulatory Visit (HOSPITAL_BASED_OUTPATIENT_CLINIC_OR_DEPARTMENT_OTHER): Payer: Medicaid Other | Admitting: Clinical

## 2015-07-15 ENCOUNTER — Encounter: Payer: Self-pay | Admitting: Family Medicine

## 2015-07-15 ENCOUNTER — Ambulatory Visit: Payer: Medicaid Other | Attending: Family Medicine | Admitting: Family Medicine

## 2015-07-15 VITALS — BP 120/79 | HR 78 | Temp 98.5°F | Resp 15 | Ht 66.0 in | Wt 142.8 lb

## 2015-07-15 DIAGNOSIS — F17200 Nicotine dependence, unspecified, uncomplicated: Secondary | ICD-10-CM | POA: Diagnosis not present

## 2015-07-15 DIAGNOSIS — I10 Essential (primary) hypertension: Secondary | ICD-10-CM

## 2015-07-15 DIAGNOSIS — Z91013 Allergy to seafood: Secondary | ICD-10-CM | POA: Diagnosis not present

## 2015-07-15 DIAGNOSIS — I63312 Cerebral infarction due to thrombosis of left middle cerebral artery: Secondary | ICD-10-CM | POA: Diagnosis not present

## 2015-07-15 DIAGNOSIS — E119 Type 2 diabetes mellitus without complications: Secondary | ICD-10-CM

## 2015-07-15 DIAGNOSIS — I6339 Cerebral infarction due to thrombosis of other cerebral artery: Secondary | ICD-10-CM | POA: Insufficient documentation

## 2015-07-15 DIAGNOSIS — Z79899 Other long term (current) drug therapy: Secondary | ICD-10-CM | POA: Diagnosis not present

## 2015-07-15 DIAGNOSIS — J4530 Mild persistent asthma, uncomplicated: Secondary | ICD-10-CM | POA: Insufficient documentation

## 2015-07-15 DIAGNOSIS — F4323 Adjustment disorder with mixed anxiety and depressed mood: Secondary | ICD-10-CM

## 2015-07-15 DIAGNOSIS — F919 Conduct disorder, unspecified: Secondary | ICD-10-CM | POA: Diagnosis not present

## 2015-07-15 DIAGNOSIS — Z7982 Long term (current) use of aspirin: Secondary | ICD-10-CM | POA: Insufficient documentation

## 2015-07-15 DIAGNOSIS — Z8673 Personal history of transient ischemic attack (TIA), and cerebral infarction without residual deficits: Secondary | ICD-10-CM | POA: Insufficient documentation

## 2015-07-15 DIAGNOSIS — Z87891 Personal history of nicotine dependence: Secondary | ICD-10-CM | POA: Diagnosis not present

## 2015-07-15 DIAGNOSIS — E785 Hyperlipidemia, unspecified: Secondary | ICD-10-CM | POA: Diagnosis not present

## 2015-07-15 DIAGNOSIS — F4329 Adjustment disorder with other symptoms: Secondary | ICD-10-CM

## 2015-07-15 DIAGNOSIS — R51 Headache: Secondary | ICD-10-CM | POA: Insufficient documentation

## 2015-07-15 LAB — GLUCOSE, POCT (MANUAL RESULT ENTRY): POC Glucose: 86 mg/dl (ref 70–99)

## 2015-07-15 NOTE — Telephone Encounter (Signed)
Call received from Myer PeerKim Lanier, [facility], who  checked the patient's medicaid status and he does not have [facility] and is not eligible for [facility] services.

## 2015-07-15 NOTE — Telephone Encounter (Signed)
The patient's needs were discussed with Dr Jarold Song.   Met with the patient and his sister, Michelene Heady, when they were in the clinic today. Michelene Heady spoke about  the concerns that she has about the patient's need for supervision in the home and the possible benefits of on-going speech therapy and/or a day program for the patient. She stated that his wife works and is out of the house from approximately 1430- midnight and his 52 yo and 63 yo children are home with him at night.  She said that she wants to make sure that the patient's mind remains stimulated. The patient was in good spirits but was not able to accurately express his needs/questions.   This CM explained about PCS services and as well as the services that [facility] might be able to provide for the patient. Michelene Heady was agreeable to have referrals made to both agencies. Also explained the role of the [facility]  to offer  counseling for the patient and his family as well as to provide  community resource information about possible  day programs, support groups and counseling options  for the patient and his family.  Both the patient and Michelene Heady agreed to meet with Roselyn Reef this afternoon at 1400.  This CM provided an update to [facility] about the patient's current needs.  Michelene Heady explained that he is still waiting for approval of his disability. He does have medicaid and receives approximately $290/month of food stamps.   A referral for PCS services was completed and is awaiting MD signature.   Call placed to Heath Lark, [facility] Liaison at [facility] # [phone] to inquire if the patient would be eligible for services from [facility]. Voice mail message left requesting a call back.

## 2015-07-15 NOTE — Progress Notes (Signed)
Patient and his sister are here to discuss the patient needing supervision during the day to manage meds etc.

## 2015-07-15 NOTE — Progress Notes (Signed)
ASSESSMENT: Pt currently experiencing Adjustment reaction with mixed emotional features, after having stroke. Pt needs f/u with PCP; may benefit from brief therapeutic interventions and community resources.  Stage of Change: precontemplative  PLAN: 1. F/U with behavioral health consultant in as needed 2. Psychiatric Medications: Ativan. 3. Behavioral recommendation(s):   -Consider resources given in office for wife([facility]) -Consider PACE program -Accept home health referral SUBJECTIVE: Pt. referred by Dr Venetia NightAmao for counseling:  Pt. reports the following symptoms/concerns: Pt agrees with sister that he is feeling a great deal of stress and anxiety over the sudden changes in communication after stroke, along with marital difficulty. Pt states that it is frustrating to no longer be able to read, and not be able to say the words he has in his mind to say; says having a beer helps him to feel better, "I don't drink a lot now, just one", and what would help him most is finding help for his wife.  Duration of problem: Less than three months Severity: severe  OBJECTIVE: Orientation & Cognition: Oriented x3. Thought processes normal and appropriate to situation. Mood: irritated, teary Affect: appropriate Appearance: appropriate Risk of harm to self or others: no known risk of harm to self or others Substance use: alcohol Assessments administered: none  Diagnosis: Adjustment reaction with mixed emotional features CPT Code: F43.23 -------------------------------------------- Other(s) present in the room: sister  Time spent with patient in exam room: 30 minutes, 2-2:30pm   Depression screen [facility] 2/[facility] 06/13/2015 05/23/2015 05/09/2015  Decreased Interest 3 3 0  Down, Depressed, Hopeless 3 3 0  PHQ - 2 Score 6 6 0  Altered sleeping 3 3 -  Tired, decreased energy 3 1 -  Change in appetite 1 0 -  Feeling bad or failure about yourself  0 3 -  Trouble  concentrating 3 3 -  Moving slowly or fidgety/restless 3 0 -  Suicidal thoughts 0 0 -  PHQ-9 Score 19 16 -    GAD 7 : Generalized Anxiety Score 05/23/2015  Nervous, Anxious, on Edge 3  Control/stop worrying 3  Worry too much - different things 3  Trouble relaxing 3  Restless 3  Easily annoyed or irritable 3  Afraid - awful might happen 3  Total GAD 7 Score 21

## 2015-07-15 NOTE — Progress Notes (Signed)
Subjective:    Patient ID: Gary Barker, male    DOB: 04/17/1965, 50 y.o.   MRN: [id number]  HPI He is a 50 year old male with a history of newly diagnosed diabetes mellitus (A1c 6.5 currently on diet control), hypertension, hyperlipidemia, tobacco abuse with recent hospitalization for CVA secondary to large left MCA territory infarct after he had presented with right-sided weakness, globally aphasia, altered mental status and did not receive TPA due to late presentation. He has been seen by physical medicine and rehabilitation- Dr Wynn BankerKirsteins and was undergoing home speech, occupational and physical therapy which ended yesterday.  He is accompanied by his sister who lives in [place] who came in because she had several concerns.she informed me she would like his therapy to be resumed again so the patient does not lose he skills and also expressed concerns about the patient's spouse not engaging the patient in conversations as often as she would like as she goes to work and at some other times ignores the patient due to his aggressive behavior. She informs me that the patient is able to perform his ADLs and does not need assistance with grooming.  His appointment with neurology was rescheduled due to the fact that there was a conflict with his wife's work schedule.  Past Medical History  Diagnosis Date  . Gout attack 04/2015  . Hyperlipidemia   . Stroke [facility](HCC) Apr 25 2015  . Diabetes mellitus (HCC) 05/23/2015    Past Surgical History  Procedure Laterality Date  . Tee without cardioversion N/A 04/29/2015    Procedure: TRANSESOPHAGEAL ECHOCARDIOGRAM (TEE);  Surgeon: Jake BatheMark C Skains, MD;  [facility];  Service: Cardiovascular;  Laterality: N/A;  . Ep implantable device N/A 04/30/2015    Procedure: Loop Recorder Insertion;  Surgeon: Duke SalviaSteven C Klein, MD;  [facility];  Service: Cardiovascular;  Laterality: N/A;    Social History   Social History  . Marital Status:  Married    Spouse Name: N/A  . Number of Children: N/A  . Years of Education: N/A   Occupational History  . Not on file.   Social History Main Topics  . Smoking status: Former Smoker -- 0.50 packs/day for 30 years    Types: Cigarettes    Quit date: 04/22/2015  . Smokeless tobacco: Never Used  . Alcohol Use: No  . Drug Use: No  . Sexual Activity: Not on file   Other Topics Concern  . Not on file   Social History Narrative    Allergies  Allergen Reactions  . Fish Allergy Swelling  . Shellfish Allergy Swelling    Current Outpatient Prescriptions on File Prior to Visit  Medication Sig Dispense Refill  . aspirin 325 MG tablet Take 1 tablet (325 mg total) by mouth daily. 90 tablet 1  . atorvastatin (LIPITOR) 40 MG tablet Take 1 tablet (40 mg total) by mouth daily at 6 PM. 90 tablet 1  . Blood Glucose Monitoring Suppl (ACCU-CHEK AVIVA) device Use as instructed daily. 1 each 0  . glucose blood (ACCU-CHEK AVIVA) test strip Use tid as directed. 30 each 12  . Ipratropium-Albuterol (COMBIVENT RESPIMAT) 20-100 MCG/ACT AERS respimat Inhale 1 puff into the lungs every 6 (six) hours. 1 Inhaler 3  . Lancet Devices (ACCU-CHEK SOFTCLIX) lancets Use as instructed tid 1 each 5  . lisinopril (PRINIVIL,ZESTRIL) 2.5 MG tablet Take 1 tablet (2.5 mg total) by mouth at bedtime. 90 tablet 1  . LORazepam (ATIVAN) 0.5 MG tablet Take 1 tablet (0.5  mg total) by mouth at bedtime. As needed 30 tablet 0  . multivitamin (PROSIGHT) TABS tablet Take 1 tablet by mouth daily. 30 each 0  . NEOMYCIN-POLYMYXIN-HYDROCORTISONE (CORTISPORIN) 1 % SOLN otic solution Place 3 drops into the left ear 4 (four) times daily. 10 mL 0  . nicotine (NICODERM CQ - DOSED IN MG/24 HOURS) 14 mg/24hr patch Place 1 patch (14 mg total) onto the skin daily. 28 patch 0  . polyethylene glycol (MIRALAX / GLYCOLAX) packet Take 17 g by mouth 2 (two) times daily. 14 each 0  . senna-docusate (SENOKOT-S) 8.6-50 MG tablet Take 2 tablets by mouth 2  (two) times daily. 60 tablet 0  . thiamine 100 MG tablet Take 1 tablet (100 mg total) by mouth daily. 90 tablet 1  . tiZANidine (ZANAFLEX) 4 MG tablet Take 1 tablet (4 mg total) by mouth every 8 (eight) hours as needed for muscle spasms. 90 tablet 1  . topiramate (TOPAMAX) 50 MG tablet Take 1 tablet (50 mg total) by mouth 2 (two) times daily. 60 tablet 1  . traMADol (ULTRAM) 50 MG tablet Take 1 tablet (50 mg total) by mouth every 6 (six) hours. For headaches. Can decrease to three times a day in a couple of weeks as headaches improve. 120 tablet 0   No current facility-administered medications on file prior to visit.     Review of Systems Constitutional: Negative for activity change and appetite change.  HENT: Negative for sinus pressure and sore throat.   Eyes: Negative for visual disturbance.  Respiratory: Negative for cough, chest tightness and shortness of breath.   Cardiovascular: Negative for chest pain and leg swelling.  Gastrointestinal: Negative for abdominal pain, diarrhea, constipation and abdominal distention.  Endocrine: Negative.   Genitourinary: Negative for dysuria.  Musculoskeletal: Negative for myalgias and joint swelling.  Skin: Negative for rash.  Allergic/Immunologic: Negative.   Neurological: Positive for headaches. Negative for weakness, light-headedness and numbness.  Psychiatric/Behavioral: Negative for suicidal ideas and dysphoric mood.     Objective: Filed Vitals:   07/15/15 1207  BP: 120/79  Pulse: 78  Temp: 98.5 F (36.9 C)  Resp: 15  Height:  (1.676 m)  Weight: 142 lb 12.8 oz (64.774 kg)  SpO2: 100%      Physical Exam  Constitutional: He is oriented to person, place, and time. He appears well-developed and well-nourished.  Neck: Normal range of motion. Neck supple. No tracheal deviation present.  Cardiovascular: Normal rate, regular rhythm and normal heart sounds.   No murmur heard. Pulmonary/Chest: Effort normal and breath sounds normal.  No respiratory distress. He has no wheezes. He exhibits no tenderness.  Abdominal: Soft. Bowel sounds are normal. He exhibits no mass. There is no tenderness.  Musculoskeletal: Normal range of motion. He exhibits no edema or tenderness.  Neurological: He is alert and oriented to person, place, and time.  Receptive and Expressive aphasia  Skin: Skin is warm and dry.  Psychiatric: He has a normal mood and affect.        Assessment & Plan:  1. Type 2 diabetes mellitus without complication, without long-term current use of insulin (HCC) A1c 6.5 Diet-controlled Keep blood sugar logs with fasting goals of 80-120 mg/dl, random of less than 161 and in the event of sugars less than 60 mg/dl or greater than 096 mg/dl please notify the clinic ASAP. It is recommended that you undergo annual eye exams and annual foot exams. Pneumovax is recommended every 5 years before the age of 60 and  once for a lifetime at or after the age of 15.   2. Behavior disturbance One-time refill of lorazepam and patient will need to receive subsequent refills from his neurologist.  3. Cerebral infarction due to thrombosis of left middle cerebral artery [facility]) Page, occupational, physical therapy ended yesterday. We'll referred to advanced home health for possible reinitiation of services but I have explained to the patient's sister that there is no guarantee that this would be approved by Medicaid LCSW called in to see the patient and his sister to discuss options for day programs, family therapy as the spouse may be undergoing caregiver stress at this time.  4. Essential hypertension Controlled  5. Asthma, mild persistent, uncomplicated Stable  6. New onset of headaches due to stroke Improved since Topamax dose was increased.

## 2015-07-16 NOTE — Telephone Encounter (Signed)
Left message for Jilda RocheOlga Baccam to call the office back and let us know if [facility] since Dr Wynn BankerKirsteins ok'd continuing therapy.  [facility] and [facility] have completed their visits.  Medicaid only allows for 24 visits.  She recommended outpt therapy. I will place order for neuro rehab outpt Speech therapy eval and treat.

## 2015-07-18 NOTE — Progress Notes (Signed)
Request for Independent Assessment for Personal Care Services completed by Dr. Venetia NightAmao. Form faxed to [facility] (Fax [phone]).

## 2015-07-22 ENCOUNTER — Telehealth: Payer: Self-pay

## 2015-07-22 NOTE — Telephone Encounter (Signed)
Call placed to [facility] # [phone] and spoke to [place] who confirmed that the request for personal care services has been received. He noted that they are waiting from a call back to schedule the assessment.

## 2015-07-25 ENCOUNTER — Ambulatory Visit (INDEPENDENT_AMBULATORY_CARE_PROVIDER_SITE_OTHER): Payer: Medicaid Other | Admitting: Internal Medicine

## 2015-07-25 ENCOUNTER — Encounter: Payer: Self-pay | Admitting: Internal Medicine

## 2015-07-25 VITALS — BP 124/84 | HR 81 | Ht 66.0 in | Wt 140.6 lb

## 2015-07-25 DIAGNOSIS — I639 Cerebral infarction, unspecified: Secondary | ICD-10-CM

## 2015-07-25 NOTE — Progress Notes (Signed)
Patient Care Team: Jaclyn Shaggy, MD as PCP - General (Family Medicine)   HPI  Gary Barker is a 50 y.o. male Seen in follow-up for implantable loop recorder for cryptogenic stroke 1/17   He continues to be bothered by difficulties with speech as well as listing. According to his family, his thinking is coherent. He is scheduled to see neurology again in a couple of weeks  Past Medical History  Diagnosis Date  . Gout attack 04/2015  . Hyperlipidemia   . Stroke [facility]) Apr 25 2015  . Diabetes mellitus (HCC) 05/23/2015    Past Surgical History  Procedure Laterality Date  . Tee without cardioversion N/A 04/29/2015    Procedure: TRANSESOPHAGEAL ECHOCARDIOGRAM (TEE);  Surgeon: Jake Bathe, MD;  Location: [facility] ENDOSCOPY;  Service: Cardiovascular;  Laterality: N/A;  . Ep implantable device N/A 04/30/2015    Procedure: Loop Recorder Insertion;  Surgeon: Duke Salvia, MD;  Location: [facility] INVASIVE CV LAB;  Service: Cardiovascular;  Laterality: N/A;    Current Outpatient Prescriptions  Medication Sig Dispense Refill  . aspirin 325 MG tablet Take 1 tablet (325 mg total) by mouth daily. 90 tablet 1  . atorvastatin (LIPITOR) 40 MG tablet Take 1 tablet (40 mg total) by mouth daily at 6 PM. 90 tablet 1  . Blood Glucose Monitoring Suppl (ACCU-CHEK AVIVA) device Use as instructed daily. 1 each 0  . glucose blood (ACCU-CHEK AVIVA) test strip Use tid as directed. 30 each 12  . Ipratropium-Albuterol (COMBIVENT RESPIMAT) 20-100 MCG/ACT AERS respimat Inhale 1 puff into the lungs every 6 (six) hours. 1 Inhaler 3  . Lancet Devices (ACCU-CHEK SOFTCLIX) lancets Use as instructed tid 1 each 5  . lisinopril (PRINIVIL,ZESTRIL) 2.5 MG tablet Take 1 tablet (2.5 mg total) by mouth at bedtime. 90 tablet 1  . LORazepam (ATIVAN) 0.5 MG tablet Take 1 tablet (0.5 mg total) by mouth at bedtime. As needed 30 tablet 0  . multivitamin (PROSIGHT) TABS tablet Take 1 tablet by mouth daily. 30 each 0  .  NEOMYCIN-POLYMYXIN-HYDROCORTISONE (CORTISPORIN) 1 % SOLN otic solution Place 3 drops into the left ear 4 (four) times daily. 10 mL 0  . nicotine (NICODERM CQ - DOSED IN MG/24 HOURS) 14 mg/24hr patch Place 1 patch (14 mg total) onto the skin daily. 28 patch 0  . polyethylene glycol (MIRALAX / GLYCOLAX) packet Take 17 g by mouth 2 (two) times daily. 14 each 0  . senna-docusate (SENOKOT-S) 8.6-50 MG tablet Take 2 tablets by mouth 2 (two) times daily. 60 tablet 0  . thiamine 100 MG tablet Take 1 tablet (100 mg total) by mouth daily. 90 tablet 1  . tiZANidine (ZANAFLEX) 4 MG tablet Take 1 tablet (4 mg total) by mouth every 8 (eight) hours as needed for muscle spasms. 90 tablet 1  . topiramate (TOPAMAX) 50 MG tablet Take 1 tablet (50 mg total) by mouth 2 (two) times daily. 60 tablet 1  . traMADol (ULTRAM) 50 MG tablet Take 1 tablet (50 mg total) by mouth every 6 (six) hours. For headaches. Can decrease to three times a day in a couple of weeks as headaches improve. 120 tablet 0   No current facility-administered medications for this visit.    Allergies  Allergen Reactions  . Fish Allergy Swelling  . Shellfish Allergy Swelling      Review of Systems negative except from HPI and PMH  Physical Exam BP 124/84 mmHg  Pulse 81  Ht  (1.676 m)  Wt 140 lb 9.6 oz (63.776 kg)  BMI 22.70 kg/m2 Well developed and well nourished in no acute distress HENT normal E scleral and icterus clear Neck Supple JVP flat; carotids brisk and full Clear to ausculation  *Regular rate and rhythm, no murmurs gallops or rub Soft with active bowel sounds No clubbing cyanosis   Edema Alert and oriented, grossly normal motor and sensory function Skin Warm and Dry    Assessment and  Plan  CVA  Loop recorder  No interval atrial fibrillation. He remains burdened by incomplete aphasia  . I gave him the phone number for vocational rehabilitation

## 2015-07-25 NOTE — Patient Instructions (Signed)
Medication Instructions: - Your physician recommends that you continue on your current medications as directed. Please refer to the Current Medication list given to you today.  Labwork: - none  Procedures/Testing: - none  Follow-Up: - Your physician wants you to follow-up in: 9 months with Dr. Graciela HusbandsKlein. You will receive a reminder letter in the mail two months in advance. If you don't receive a letter, please call our office to schedule the follow-up appointment.  Any Additional Special Instructions Will Be Listed Below (If Applicable).     If you need a refill on your cardiac medications before your next appointment, please call your pharmacy.

## 2015-07-29 ENCOUNTER — Ambulatory Visit (INDEPENDENT_AMBULATORY_CARE_PROVIDER_SITE_OTHER): Payer: Medicaid Other | Admitting: *Deleted

## 2015-07-29 DIAGNOSIS — I639 Cerebral infarction, unspecified: Secondary | ICD-10-CM | POA: Diagnosis not present

## 2015-07-30 NOTE — Progress Notes (Signed)
[facility] Summary Report / Loop Recorder 

## 2015-08-06 MED FILL — ATORVASTATIN 40 MG TABLET: 40 | 30 days supply | Qty: 30 | Fill #2

## 2015-08-06 MED FILL — LISINOPRIL 2.5 MG TABLET: 2.5 | 30 days supply | Qty: 30 | Fill #2

## 2015-08-07 ENCOUNTER — Ambulatory Visit: Payer: Medicaid Other | Attending: Physical Medicine & Rehabilitation

## 2015-08-07 DIAGNOSIS — I69320 Aphasia following cerebral infarction: Secondary | ICD-10-CM | POA: Diagnosis not present

## 2015-08-07 NOTE — Therapy (Signed)
[facility] [address] [place], [place], [address] Phone: [phone]   Fax:  [phone]  Speech Language Pathology Evaluation  Patient Details  Name: Gary Barker MRN: [id number] Date of Birth: July 29, 1965 Referring Provider: Wynn Banker  Encounter Date: 08/07/2015      End of Session - 08/07/15 1655    Visit Number 1   Number of Visits 1   Date for SLP Re-Evaluation 08/07/15   Authorization Type Pt exhausted Medicaid visits with home health   SLP Start Time 0805   SLP Stop Time  0848   SLP Time Calculation (min) 43 min   Activity Tolerance Patient tolerated treatment well      Past Medical History  Diagnosis Date  . Gout attack 04/2015  . Hyperlipidemia   . Stroke [facility]) Apr 25 2015  . Diabetes mellitus (HCC) 05/23/2015    Past Surgical History  Procedure Laterality Date  . Tee without cardioversion N/A 04/29/2015    Procedure: TRANSESOPHAGEAL ECHOCARDIOGRAM (TEE);  Surgeon: Jake Bathe, MD;  Location: [facility] ENDOSCOPY;  Service: Cardiovascular;  Laterality: N/A;  . Ep implantable device N/A 04/30/2015    Procedure: Loop Recorder Insertion;  Surgeon: Duke Salvia, MD;  Location: [facility] INVASIVE CV LAB;  Service: Cardiovascular;  Laterality: N/A;    There were no vitals filed for this visit.      Subjective Assessment - 08/07/15 0810    Subjective "What you mean, pain? (3 seconds) No." (pt, re: SLP quesiton if he has any pain today)   Patient is accompained by: --  wife - zen-i-da Murtis Sink)   Currently in Pain? No/denies            SLP Evaluation [facility] - 08/07/15 0810    SLP Visit Information   SLP Received On 08/07/14   Referring Provider Kirsteins   Onset Date 04-26-15   Medical Diagnosis CVA   General Information   HPI Pt had HHST with [facility] - 24 visits were used.   Prior Functional Status   Cognitive/Linguistic Baseline Within functional limits   Type of Home House    Lives With Family   Cognition   Overall Cognitive Status Difficult to assess  appeared WNL for extent SLP could assess informally   Difficult to assess due to --  expressive aphasia   Auditory Comprehension   Overall Auditory Comprehension Impaired   Yes/No Questions Impaired   Basic Biographical Questions 26-50% accurate  50%   Basic Immediate Environment Questions 25-49% accurate  40%   Conversation Simple  questioning cues consistently needed,&minimally successful   Other Conversation Comments Fluent aphasia with mostly semantic paraphasias and vague speech with some minimal word salad.    Verbal Expression   Overall Verbal Expression Impaired   Automatic Speech Name;Social Response  counting and days of week with min A   Repetition Impaired   Level of Impairment Word level;Phrase level;Sentence level   Naming Impairment   Confrontation 25-49% accurate  30%   Other Naming Comments phonemic paraphasias, semantic paraphasias; pt aware of errors 75% of the time   Effective Techniques Written cues  gestures   Other Verbal Expression Comments Pt does not use any spontaneous compensations for verbal expression, partly due to decr'd awareness of deficit.   Oral Motor/Sensory Function   Overall Oral Motor/Sensory Function Appears within functional limits for tasks assessed   Motor Speech   Overall Motor Speech Appears within functional limits for tasks assessed  SLP Education - 08/07/15 1654    Education provided Yes   Education Details ways to simplify communication between pt and wife   Person(s) Educated Patient;Spouse   Methods Explanation;Demonstration   Comprehension Verbalized understanding;Need further instruction  wife verbalized understanding, pt req'd verbal cues and needs further instruction              Plan - 08/07/15 1656    Clinical Impression Statement Pt will be evaluation only today, as pt's wife/pt indicated they did not want to  pursue self-pay status at this time. SLP provided information re: [facility], and front office staff provided information regarding [employer] financial assistance.   Speech Therapy Frequency One time visit   Consulted and Agree with Plan of Care Patient;Family member/caregiver      Patient will benefit from skilled therapeutic intervention in order to improve the following deficits and impairments:   Aphasia following cerebral infarction    Problem List Patient Active Problem List   Diagnosis Date Noted  . Behavior disturbance 06/13/2015  . Asthma 05/23/2015  . Diabetes mellitus (HCC) 05/23/2015  . Constipation 05/08/2015  . New onset of headaches due to stroke 05/08/2015  . Acute ischemic left MCA stroke (HCC) 05/01/2015  . Gait disturbance, post-stroke   . Gout, chronic   . Dysphagia due to recent stroke   . Aphasia due to recent cerebral infarction   . Altered mental status   . Alcoholism (HCC)   . Essential hypertension   . Tobacco abuse   . CVA (cerebral infarction) 04/26/2015  . Stroke (cerebrum) (HCC) 04/26/2015  . Cerebral infarction due to unspecified mechanism   . Alcohol use disorder (HCC)   . Tobacco use disorder     [facility] LP[facility]INKE,CARL ,MS, CCC-SLP  08/07/2015, 4:59 PM  [facility] [address] [place], [place], [address] Phone: [phone]   Fax:  [phone]  Name: Kaylyn LayerJuan Eichel MRN: [id number] Date of Birth: 03/02/1966

## 2015-08-07 NOTE — Patient Instructions (Signed)
Pt was provided with some basic receptive and expressive language tasks to work on at home, given status of exhausted Medicaid visits, via paper/pencil tasks.

## 2015-08-08 ENCOUNTER — Ambulatory Visit (INDEPENDENT_AMBULATORY_CARE_PROVIDER_SITE_OTHER): Payer: Medicaid Other | Admitting: Neurology

## 2015-08-08 ENCOUNTER — Encounter: Payer: Self-pay | Admitting: Neurology

## 2015-08-08 VITALS — BP 128/86 | HR 76 | Ht 66.0 in | Wt 140.4 lb

## 2015-08-08 DIAGNOSIS — I69320 Aphasia following cerebral infarction: Secondary | ICD-10-CM

## 2015-08-08 DIAGNOSIS — I69920 Aphasia following unspecified cerebrovascular disease: Secondary | ICD-10-CM

## 2015-08-08 NOTE — Progress Notes (Signed)
[facility] [address] [place]. [place] [id number] [phone]       OFFICE FOLLOW-UP NOTE  [name] Date of Birth:  04/09/1966 Medical Record Number:  [id number]   HPI: [id number] year Hispanic male seen today for follow-up after hospital admission for stroke in general 2017. He is accompanied by his wife who provides most of the history.Gary Barker is a 50 y.o. male who was last seen in his normal state around 5 or 6 PM last night. He was then seen to be abnormal around 9 PM. His wife works third shift, this morning when his wife saw him around 7 AM, he was asking inappropriate questions and dragging his right foot. She therefore brought him into the emergency room where CT head was performed which shows patchy left MCA territory infarct. . LKW: 9 PM tpa given?: no, out of window  CT angiogram of the head was technically of poor quality but did not show any large vessel stenosis or occlusion. MRI scan showed extensive areas of acute nonhemorrhagic infarcts throughout the left MCA territory with sparing of the basal ganglia. There is focal high-grade stenosis in the distal M1 segment of the left middle cerebral artery.. Transversely echo showed normal ejection fraction. Transesophageal echo showed no cardiac source of embolism. Patient LDL cholesterol 102 and hemoglobin A1c was 6.5 ng percent. Patient was felt to have cryptogenic stroke and underwent loop recorder insertion which so far has not. Detected paroxysmal atrial fibrillation. Patient has been getting outpatient speech therapy and has completed the course. His able to communicate now but still has significant expressive as well as receptive language deficits. Patient has not yet quit smoking. He is tolerating aspirin without bleeding or bruising. Is also on Lipitor which is tolerating well. The patient's wife states that he does get frustrated and angry as well as depressed at times. Patient was started on Topamax for  headaches in rehabilitation but is doing quite well and has stopped that medication a month ago. ROS:   14 system review of systems is positive for  speech difficulties, trouble understanding and all other systems negative PMH:  Past Medical History  Diagnosis Date  . Gout attack 04/2015  . Hyperlipidemia   . Stroke [facility](HCC) Apr 25 2015  . Diabetes mellitus (HCC) 05/23/2015    Social History:  Social History   Social History  . Marital Status: Married    Spouse Name: N/A  . Number of Children: N/A  . Years of Education: N/A   Occupational History  . Not on file.   Social History Main Topics  . Smoking status: Current Every Day Smoker -- 0.50 packs/day for 30 years    Types: Cigarettes    Last Attempt to Quit: 04/22/2015  . Smokeless tobacco: Never Used  . Alcohol Use: No  . Drug Use: No  . Sexual Activity: Not on file   Other Topics Concern  . Not on file   Social History Narrative    Medications:   Current Outpatient Prescriptions on File Prior to Visit  Medication Sig Dispense Refill  . aspirin 325 MG tablet Take 1 tablet (325 mg total) by mouth daily. 90 tablet 1  . atorvastatin (LIPITOR) 40 MG tablet Take 1 tablet (40 mg total) by mouth daily at 6 PM. 90 tablet 1  . lisinopril (PRINIVIL,ZESTRIL) 2.5 MG tablet Take 1 tablet (2.5 mg total) by mouth at bedtime. 90 tablet 1  . multivitamin (PROSIGHT) TABS tablet Take 1 tablet by mouth  daily. 30 each 0  . thiamine 100 MG tablet Take 1 tablet (100 mg total) by mouth daily. 90 tablet 1   No current facility-administered medications on file prior to visit.    Allergies:   Allergies  Allergen Reactions  . Fish Allergy Swelling  . Shellfish Allergy Swelling    Physical Exam General: well developed, well nourished Middle-aged Hispanic male, seated, in no evident distress Head: head normocephalic and atraumatic.  Neck: supple with no carotid or supraclavicular bruits Cardiovascular: regular rate and rhythm, no  murmurs Musculoskeletal: no deformity Skin:  no rash/petichiae Vascular:  Normal pulses all extremities Filed Vitals:   08/08/15 0945  BP: 128/86  Pulse: 76   Neurologic Exam Mental Status: Awake and fully alert. Oriented to place and time. Recent and remote memory intact. Attention span, concentration and fund of knowledge appropriate. Mood and affect appropriate. Mild expressive and moderate receptive aphasia. Nonfluent speech with paraphasic errors. Difficulty with naming and petition. Able to follow only simple one-step commands. Cranial Nerves: Fundoscopic exam reveals sharp disc margins. Pupils equal, briskly reactive to light. Extraocular movements full without nystagmus. Visual fields full to confrontation. Hearing intact. Facial sensation intact. Face, tongue, palate moves normally and symmetrically.  Motor: Normal bulk and tone. Normal strength in all tested extremity muscles. Sensory.: intact to touch ,pinprick .position and vibratory sensation.  Coordination: Rapid alternating movements normal in all extremities. Finger-to-nose and heel-to-shin performed accurately bilaterally. Gait and Station: Arises from chair without difficulty. Stance is normal. Gait demonstrates normal stride length and balance . Able to heel, toe and tandem walk without difficulty.  Reflexes: 1+ and symmetric. Toes downgoing.   NIHSS  3 Modified Rankin  3   ASSESSMENT: 32 year Hispanic male with embolic left MCA infarct in January 2017 of cryptogenic etiology with vascular risk factors of smoking, diabetes and hyperlipidemia and hypertension. His has significant residual aphasia and disability    PLAN: I had a long d/w patient and his wife about his recent stroke, risk for recurrent stroke/TIAs, personally independently reviewed imaging studies and stroke evaluation results and answered questions.Continue aspirin 325 mg daily  for secondary stroke prevention and maintain strict control of hypertension  with blood pressure goal below 130/90, diabetes with hemoglobin A1c goal below 6.5% and lipids with LDL cholesterol goal below 70 mg/dL. I also advised the patient to eat a healthy diet with plenty of whole grains, cereals, fruits and vegetables, exercise regularly and maintain ideal body weight. The patient will clearly benefit with more ongoing speech therapy but unfortunately his insurance will no longer pay for it and he cannot afford to pay out of pocket. Patient may consider possible participation in the RESPECT ESUS trial. I will give them written information to review at home and decideGreater than 50% of time during this 25 minute visit was spent on counseling,explanation of diagnosis, planning of further management, discussion with patient and family and coordination of care  .Followup in the future with troponins practitioner in 3 months or call earlier if necessary   Delia Heady, MD  [facility] [address] [place], [place] [address]  Phone [phone] Fax [phone]  Note: This document was prepared with digital dictation and possible smart phrase technology. Any transcriptional errors that result from this process are unintentional

## 2015-08-08 NOTE — Patient Instructions (Signed)
I had a long d/w patient and his wife about his recent stroke, risk for recurrent stroke/TIAs, personally independently reviewed imaging studies and stroke evaluation results and answered questions.Continue aspirin 325 mg daily  for secondary stroke prevention and maintain strict control of hypertension with blood pressure goal below 130/90, diabetes with hemoglobin A1c goal below 6.5% and lipids with LDL cholesterol goal below 70 mg/dL. I also advised the patient to eat a healthy diet with plenty of whole grains, cereals, fruits and vegetables, exercise regularly and maintain ideal body weight. The patient will clearly benefit with more ongoing speech therapy but unfortunately his insurance will no longer pay for it and he cannot afford to pay out of pocket. Patient may consider possible participation in the RESPECT ESUS trial. I will give them written information to review at home and decide Followup in the future with troponins practitioner in 3 months or call earlier if necessary Stroke Prevention Some medical conditions and behaviors are associated with an increased chance of having a stroke. You may prevent a stroke by making healthy choices and managing medical conditions. HOW CAN I REDUCE MY RISK OF HAVING A STROKE?   Stay physically active. Get at least 30 minutes of activity on most or all days.  Do not smoke. It may also be helpful to avoid exposure to secondhand smoke.  Limit alcohol use. Moderate alcohol use is considered to be:  No more than 2 drinks per day for men.  No more than 1 drink per day for nonpregnant women.  Eat healthy foods. This involves:  Eating 5 or more servings of fruits and vegetables a day.  Making dietary changes that address high blood pressure (hypertension), high cholesterol, diabetes, or obesity.  Manage your cholesterol levels.  Making food choices that are high in fiber and low in saturated fat, trans fat, and cholesterol may control cholesterol  levels.  Take any prescribed medicines to control cholesterol as directed by your health care provider.  Manage your diabetes.  Controlling your carbohydrate and sugar intake is recommended to manage diabetes.  Take any prescribed medicines to control diabetes as directed by your health care provider.  Control your hypertension.  Making food choices that are low in salt (sodium), saturated fat, trans fat, and cholesterol is recommended to manage hypertension.  Ask your health care provider if you need treatment to lower your blood pressure. Take any prescribed medicines to control hypertension as directed by your health care provider.  If you are 22-80 years of age, have your blood pressure checked every 3-5 years. If you are 33 years of age or older, have your blood pressure checked every year.  Maintain a healthy weight.  Reducing calorie intake and making food choices that are low in sodium, saturated fat, trans fat, and cholesterol are recommended to manage weight.  Stop drug abuse.  Avoid taking birth control pills.  Talk to your health care provider about the risks of taking birth control pills if you are over 38 years old, smoke, get migraines, or have ever had a blood clot.  Get evaluated for sleep disorders (sleep apnea).  Talk to your health care provider about getting a sleep evaluation if you snore a lot or have excessive sleepiness.  Take medicines only as directed by your health care provider.  For some people, aspirin or blood thinners (anticoagulants) are helpful in reducing the risk of forming abnormal blood clots that can lead to stroke. If you have the irregular heart rhythm of atrial  fibrillation, you should be on a blood thinner unless there is a good reason you cannot take them.  Understand all your medicine instructions.  Make sure that other conditions (such as anemia or atherosclerosis) are addressed. SEEK IMMEDIATE MEDICAL CARE IF:   You have sudden  weakness or numbness of the face, arm, or leg, especially on one side of the body.  Your face or eyelid droops to one side.  You have sudden confusion.  You have trouble speaking (aphasia) or understanding.  You have sudden trouble seeing in one or both eyes.  You have sudden trouble walking.  You have dizziness.  You have a loss of balance or coordination.  You have a sudden, severe headache with no known cause.  You have new chest pain or an irregular heartbeat. Any of these symptoms may represent a serious problem that is an emergency. Do not wait to see if the symptoms will go away. Get medical help at once. Call your local emergency services (911 in [place] Do not drive yourself to the hospital.   This information is not intended to replace advice given to you by your health care provider. Make sure you discuss any questions you have with your health care provider.   Document Released: 05/06/2004 Document Revised: 04/19/2014 Document Reviewed: 09/29/2012 Elsevier Interactive Patient Education [employer]2016 Elsevier Inc.

## 2015-08-11 ENCOUNTER — Telehealth: Payer: Self-pay | Admitting: Neurology

## 2015-08-11 NOTE — Telephone Encounter (Signed)
Rn call patients sister Gary Barker who is on DPR form for patient. Pts sister lives in [place] and is trying to help her brother out who lives in [place]. [facility] is [facility] a office visit with Dr. Pearlean BrownieSethi. Pts sister stated his wife does not keep up with the care and appts for her brother. PT sister Gary Barker also stated she had to get the appt for the hospital follow up, therapy and cardiologist made for her brother. PTs therapy ended because of insurance and his wife was applying for the financial assistance program thru [facility]. Rn gave the sister the plan from Dr.Sethi note and stated pt was interested in the research study. Pts sister kept asking when will he be schedule and how long will he be in the study. PTs sister also ask can she contact the research study. Rn explain that patient has to agree to the research study and they will contact him for appts and evaluation. Pts sister was really frustrated that her brothers wife is not taking a good effort at doing things,and trying to get help for her brother. Pts sister ask that her number be the main one on the appts.  The pts wife speaks spanish and english. Pts sister  Also ask if the social security office sent any forms to Dr.Sethi to filled out. The patient has mild speech issues. Rn stated no form has been sent to [facility] or Dr.Sethi. Rn explain that there is a 25.00 dollar charge to filled the form out,and the patient has to sign a release form. Pts sister verbalized understanding of the process.

## 2015-08-11 NOTE — Telephone Encounter (Signed)
Pt's sister called requesting to know what the OV entailed for her brother. Sister-in-law is not communicating with her very well. Sister is in [place].

## 2015-08-15 LAB — CUP PACEART REMOTE DEVICE CHECK: Date Time Interrogation Session: 20170217213520

## 2015-08-27 ENCOUNTER — Other Ambulatory Visit: Payer: Self-pay | Admitting: Family Medicine

## 2015-08-27 NOTE — Telephone Encounter (Signed)
Pt.'s sister is calling to request medication refills for all his current medications.....  Patient is moving to another state and sister will be picking him up next week....  Patient's sister also requested to speak to social worker, she was informed we don't have a Child psychotherapistsocial worker available at this moment. Nurse could possibly refer her to someone.  Patient's sister will also be coming in to request patients records.   Please follow up

## 2015-08-28 ENCOUNTER — Ambulatory Visit (INDEPENDENT_AMBULATORY_CARE_PROVIDER_SITE_OTHER): Payer: Self-pay | Admitting: *Deleted

## 2015-08-28 DIAGNOSIS — I639 Cerebral infarction, unspecified: Secondary | ICD-10-CM

## 2015-08-28 NOTE — Telephone Encounter (Signed)
This Case Manager placed return call to patient's sister to discuss. Awaiting return call.

## 2015-08-29 NOTE — Progress Notes (Signed)
[facility] Summary Report / Loop Recorder 

## 2015-09-01 ENCOUNTER — Telehealth: Payer: Self-pay

## 2015-09-01 MED FILL — LISINOPRIL 2.5 MG TABLET: 2.5 | 30 days supply | Qty: 30 | Fill #3

## 2015-09-01 MED FILL — ATORVASTATIN 40 MG TABLET: 40 | 30 days supply | Qty: 30 | Fill #3

## 2015-09-01 NOTE — Telephone Encounter (Signed)
Met with the patient and his sister, Gary Barker,  and his brother  when they were in the clinic today to pick up refills for his  lisinopril and lipitor.  His sister stated that she is taking him back to [place] with her because his wife is not able to care for him and is planning to return to [place].  His sister noted that she is scheduling him for an appointment with a neurologist in [place] and she also needs to  locate a new PCP for him.   She said that she wants to make sure that he will have enough medication until he is seen by a PCP. Gary Barker said that she knows that she can always take him to the ED if needed and she will apply for  [facility] for him as well.    The patient has still has 1 refill on both the lisinopril and lipitor, this was confirmed with Kerby Moors, RPH. Gary Barker stated that she can have her sister in law pick up the refills if needed in 30 days and her sister in law will get the medications to him.   No other questions reported at this time.

## 2015-09-06 LAB — CUP PACEART REMOTE DEVICE CHECK: MDC IDC SESS DTM: 20170319214029

## 2015-09-06 NOTE — Progress Notes (Signed)
Carelink summary report received. Battery status OK. Normal device function. No new symptom episodes, tachy episodes, brady, or pause episodes. No new AF episodes. Monthly summary reports and ROV/PRN 

## 2015-09-08 LAB — CUP PACEART REMOTE DEVICE CHECK: MDC IDC SESS DTM: 20170418220634

## 2015-09-08 NOTE — Progress Notes (Signed)
Carelink summary report received. Battery status OK. Normal device function. No new symptom episodes, tachy episodes, brady, or pause episodes. No new AF episodes. Monthly summary reports and ROV/PRN 

## 2015-09-26 ENCOUNTER — Telehealth: Payer: Self-pay | Admitting: Cardiology

## 2015-09-26 NOTE — Telephone Encounter (Signed)
LMOVM requesting that pt send manual transmission b/c home monitor has not updated in at least 14 days.    

## 2015-09-29 ENCOUNTER — Ambulatory Visit (INDEPENDENT_AMBULATORY_CARE_PROVIDER_SITE_OTHER): Payer: Medicaid Other | Admitting: *Deleted

## 2015-09-29 DIAGNOSIS — I639 Cerebral infarction, unspecified: Secondary | ICD-10-CM

## 2015-09-29 NOTE — Progress Notes (Signed)
[facility] Summary Report / Loop Recorder 

## 2015-10-06 LAB — CUP PACEART REMOTE DEVICE CHECK
Date Time Interrogation Session: 20170518223605
MDC IDC SESS DTM: 20170617223816

## 2015-10-24 ENCOUNTER — Telehealth: Payer: Self-pay | Admitting: Cardiology

## 2015-10-24 NOTE — Telephone Encounter (Signed)
LMOVM requesting that pt send manual transmission b/c home monitor has not updated in at least 14 days.    

## 2015-10-27 ENCOUNTER — Ambulatory Visit (INDEPENDENT_AMBULATORY_CARE_PROVIDER_SITE_OTHER): Payer: Medicaid Other | Admitting: *Deleted

## 2015-10-27 DIAGNOSIS — I639 Cerebral infarction, unspecified: Secondary | ICD-10-CM | POA: Diagnosis not present

## 2015-10-28 NOTE — Progress Notes (Signed)
[facility] Summary Report / Loop Recorder 

## 2015-10-31 ENCOUNTER — Telehealth: Payer: Self-pay | Admitting: *Deleted

## 2015-10-31 NOTE — Telephone Encounter (Signed)
Pt medical records faxed to [employer] on 10/31/15.

## 2015-11-07 ENCOUNTER — Ambulatory Visit: Payer: Medicaid Other | Admitting: Nurse Practitioner

## 2015-11-20 LAB — CUP PACEART REMOTE DEVICE CHECK: Date Time Interrogation Session: 20170717230626

## 2015-11-21 ENCOUNTER — Telehealth: Payer: Self-pay | Admitting: Internal Medicine

## 2015-11-21 NOTE — Telephone Encounter (Signed)
New message    [facility]   Requesting pacer maker notes.    1. Are you calling in reference to your FMLA or disability form? No   2. What is your question in regards to FMLA or disability form? No    3. Do you need copies of your medical records? Yes   4. Are you waiting on a nurse to call you back with results or are you wanting copies of your results? No

## 2015-11-26 ENCOUNTER — Ambulatory Visit (INDEPENDENT_AMBULATORY_CARE_PROVIDER_SITE_OTHER): Payer: Medicaid Other | Admitting: *Deleted

## 2015-11-26 DIAGNOSIS — I639 Cerebral infarction, unspecified: Secondary | ICD-10-CM

## 2015-11-27 NOTE — Progress Notes (Signed)
[facility] Summary Report / Loop Recorder 

## 2015-12-23 LAB — CUP PACEART REMOTE DEVICE CHECK: MDC IDC SESS DTM: 20170816231156

## 2015-12-25 ENCOUNTER — Telehealth: Payer: Self-pay | Admitting: Cardiology

## 2015-12-25 NOTE — Telephone Encounter (Signed)
LMOVM requesting that pt send manual transmission b/c home monitor has not updated in at least 14 days.    

## 2015-12-26 ENCOUNTER — Ambulatory Visit (INDEPENDENT_AMBULATORY_CARE_PROVIDER_SITE_OTHER): Payer: Medicaid Other | Admitting: *Deleted

## 2015-12-26 DIAGNOSIS — I639 Cerebral infarction, unspecified: Secondary | ICD-10-CM | POA: Diagnosis not present

## 2015-12-29 NOTE — Progress Notes (Signed)
[facility] Summary Report / Loop Recorder 

## 2016-01-02 ENCOUNTER — Encounter: Payer: Self-pay | Admitting: Cardiology

## 2016-01-15 ENCOUNTER — Encounter: Payer: Self-pay | Admitting: Cardiology

## 2016-01-15 NOTE — Progress Notes (Signed)
Letter  

## 2016-01-17 LAB — CUP PACEART REMOTE DEVICE CHECK: MDC IDC SESS DTM: 20170915233940

## 2016-01-17 NOTE — Progress Notes (Signed)
Carelink summary report received. Battery status OK. Normal device function. No new symptom episodes, tachy episodes, brady, or pause episodes. No new AF episodes. Monthly summary reports and ROV/PRN 

## 2016-01-26 ENCOUNTER — Encounter: Payer: Medicaid Other | Admitting: *Deleted

## 2016-02-24 ENCOUNTER — Ambulatory Visit (INDEPENDENT_AMBULATORY_CARE_PROVIDER_SITE_OTHER): Payer: Medicaid Other | Admitting: *Deleted

## 2016-02-24 DIAGNOSIS — I639 Cerebral infarction, unspecified: Secondary | ICD-10-CM | POA: Diagnosis not present

## 2016-02-27 NOTE — Progress Notes (Signed)
[facility] Summary Report / Loop Recorder 

## 2016-03-25 ENCOUNTER — Ambulatory Visit (INDEPENDENT_AMBULATORY_CARE_PROVIDER_SITE_OTHER): Payer: Medicaid Other | Admitting: *Deleted

## 2016-03-25 DIAGNOSIS — I639 Cerebral infarction, unspecified: Secondary | ICD-10-CM | POA: Diagnosis not present

## 2016-03-26 NOTE — Progress Notes (Signed)
[facility] Summary Report / Loop Recorder 

## 2016-04-08 LAB — CUP PACEART REMOTE DEVICE CHECK
MDC IDC PG IMPLANT DT: 20170118
MDC IDC SESS DTM: 20171115010948

## 2016-04-26 ENCOUNTER — Ambulatory Visit (INDEPENDENT_AMBULATORY_CARE_PROVIDER_SITE_OTHER): Payer: Medicaid Other | Admitting: *Deleted

## 2016-04-26 DIAGNOSIS — I639 Cerebral infarction, unspecified: Secondary | ICD-10-CM

## 2016-04-26 NOTE — Progress Notes (Signed)
[facility] Summary Report / Loop Recorder 

## 2016-05-16 LAB — CUP PACEART REMOTE DEVICE CHECK
MDC IDC PG IMPLANT DT: 20170118
MDC IDC SESS DTM: 20171215013533

## 2016-05-16 NOTE — Progress Notes (Signed)
Carelink summary report received. Battery status OK. Normal device function. No new symptom episodes, tachy episodes, brady, or pause episodes. No new AF episodes. Monthly summary reports and ROV/PRN 

## 2016-05-24 ENCOUNTER — Ambulatory Visit (INDEPENDENT_AMBULATORY_CARE_PROVIDER_SITE_OTHER): Payer: Self-pay | Admitting: *Deleted

## 2016-05-24 DIAGNOSIS — I639 Cerebral infarction, unspecified: Secondary | ICD-10-CM

## 2016-05-25 NOTE — Progress Notes (Signed)
[facility] Summary Report / Loop Recorder 

## 2016-06-05 LAB — CUP PACEART REMOTE DEVICE CHECK
Date Time Interrogation Session: 20180114024244
Implantable Pulse Generator Implant Date: 20170118

## 2016-06-05 NOTE — Progress Notes (Signed)
Carelink summary report received. Battery status OK. Normal device function. No new symptom episodes, tachy episodes, brady, or pause episodes. No new AF episodes. Monthly summary reports and ROV/PRN 

## 2016-06-21 LAB — CUP PACEART REMOTE DEVICE CHECK
Implantable Pulse Generator Implant Date: 20170118
MDC IDC SESS DTM: 20180213024217

## 2016-06-23 ENCOUNTER — Ambulatory Visit (INDEPENDENT_AMBULATORY_CARE_PROVIDER_SITE_OTHER): Payer: Self-pay | Admitting: *Deleted

## 2016-06-23 DIAGNOSIS — I639 Cerebral infarction, unspecified: Secondary | ICD-10-CM

## 2016-06-24 NOTE — Progress Notes (Signed)
[facility] Summary Report / Loop Recorder 

## 2016-07-01 LAB — CUP PACEART REMOTE DEVICE CHECK
Implantable Pulse Generator Implant Date: 20170118
MDC IDC SESS DTM: 20180315034141

## 2016-07-01 NOTE — Progress Notes (Signed)
Carelink summary report received. Battery status OK. Normal device function. No new symptom episodes, tachy episodes, brady, or pause episodes. No new AF episodes. Monthly summary reports and ROV/PRN 

## 2016-07-23 ENCOUNTER — Ambulatory Visit (INDEPENDENT_AMBULATORY_CARE_PROVIDER_SITE_OTHER): Payer: Self-pay | Admitting: *Deleted

## 2016-07-23 DIAGNOSIS — I639 Cerebral infarction, unspecified: Secondary | ICD-10-CM

## 2016-07-26 NOTE — Progress Notes (Signed)
[facility] Summary Report / Loop Recorder 

## 2016-08-03 LAB — CUP PACEART REMOTE DEVICE CHECK
Date Time Interrogation Session: 20180414033728
MDC IDC PG IMPLANT DT: 20170118

## 2016-08-23 ENCOUNTER — Ambulatory Visit (INDEPENDENT_AMBULATORY_CARE_PROVIDER_SITE_OTHER): Payer: Self-pay | Admitting: *Deleted

## 2016-08-23 DIAGNOSIS — I639 Cerebral infarction, unspecified: Secondary | ICD-10-CM

## 2016-08-23 NOTE — Progress Notes (Signed)
[facility] Summary Report / Loop Recorder 

## 2016-08-27 LAB — CUP PACEART REMOTE DEVICE CHECK
Implantable Pulse Generator Implant Date: 20170118
MDC IDC SESS DTM: 20180514040606

## 2016-09-22 ENCOUNTER — Ambulatory Visit (INDEPENDENT_AMBULATORY_CARE_PROVIDER_SITE_OTHER): Payer: Self-pay | Admitting: *Deleted

## 2016-09-22 DIAGNOSIS — I639 Cerebral infarction, unspecified: Secondary | ICD-10-CM

## 2016-09-22 NOTE — Progress Notes (Signed)
[facility] Summary Report / Loop Recorder 

## 2016-10-04 LAB — CUP PACEART REMOTE DEVICE CHECK
Implantable Pulse Generator Implant Date: 20170118
MDC IDC SESS DTM: 20180613040706

## 2016-10-04 NOTE — Progress Notes (Signed)
Carelink summary report received. Battery status OK. Normal device function. No new symptom episodes, tachy episodes, brady, or pause episodes. No new AF episodes. Monthly summary reports and ROV/PRN 

## 2016-10-22 ENCOUNTER — Ambulatory Visit (INDEPENDENT_AMBULATORY_CARE_PROVIDER_SITE_OTHER): Payer: Self-pay | Admitting: *Deleted

## 2016-10-22 DIAGNOSIS — I639 Cerebral infarction, unspecified: Secondary | ICD-10-CM

## 2016-10-22 NOTE — Progress Notes (Signed)
[facility] Summary Report / Loop Recorder 

## 2016-10-28 LAB — CUP PACEART REMOTE DEVICE CHECK
Date Time Interrogation Session: 20180713044522
MDC IDC PG IMPLANT DT: 20170118

## 2016-11-04 ENCOUNTER — Telehealth: Payer: Self-pay | Admitting: Cardiology

## 2016-11-04 NOTE — Telephone Encounter (Signed)
LMOVM requesting that pt send manual transmission b/c home monitor has not updated in at least 14 days.    

## 2016-11-22 ENCOUNTER — Ambulatory Visit (INDEPENDENT_AMBULATORY_CARE_PROVIDER_SITE_OTHER): Payer: Self-pay | Admitting: *Deleted

## 2016-11-22 DIAGNOSIS — I63512 Cerebral infarction due to unspecified occlusion or stenosis of left middle cerebral artery: Secondary | ICD-10-CM

## 2016-11-27 LAB — CUP PACEART REMOTE DEVICE CHECK
Date Time Interrogation Session: 20180812161149
Implantable Pulse Generator Implant Date: 20170118

## 2016-11-27 NOTE — Progress Notes (Signed)
Carelink summary report received. Battery status OK. Normal device function. No new symptom episodes, tachy episodes, brady, or pause episodes. No new AF episodes. Monthly summary reports and ROV/PRN 

## 2016-12-21 ENCOUNTER — Ambulatory Visit (INDEPENDENT_AMBULATORY_CARE_PROVIDER_SITE_OTHER): Payer: Self-pay | Admitting: *Deleted

## 2016-12-21 DIAGNOSIS — I63512 Cerebral infarction due to unspecified occlusion or stenosis of left middle cerebral artery: Secondary | ICD-10-CM

## 2016-12-21 NOTE — Progress Notes (Signed)
[facility] Summary Report / Loop Recorder 

## 2016-12-27 LAB — CUP PACEART REMOTE DEVICE CHECK
Implantable Pulse Generator Implant Date: 20170118
MDC IDC SESS DTM: 20180911163947

## 2017-01-20 ENCOUNTER — Ambulatory Visit (INDEPENDENT_AMBULATORY_CARE_PROVIDER_SITE_OTHER): Payer: Self-pay | Admitting: *Deleted

## 2017-01-20 DIAGNOSIS — I63512 Cerebral infarction due to unspecified occlusion or stenosis of left middle cerebral artery: Secondary | ICD-10-CM

## 2017-01-21 NOTE — Progress Notes (Signed)
[facility] Summary Report / Loop Recorder 

## 2017-01-25 LAB — CUP PACEART REMOTE DEVICE CHECK
Date Time Interrogation Session: 20181011164129
Implantable Pulse Generator Implant Date: 20170118

## 2017-02-21 ENCOUNTER — Ambulatory Visit (INDEPENDENT_AMBULATORY_CARE_PROVIDER_SITE_OTHER): Payer: Self-pay | Admitting: *Deleted

## 2017-02-21 DIAGNOSIS — I63512 Cerebral infarction due to unspecified occlusion or stenosis of left middle cerebral artery: Secondary | ICD-10-CM

## 2017-02-21 NOTE — Progress Notes (Signed)
[facility] Summary Report / Loop Recorder 

## 2017-02-28 LAB — CUP PACEART REMOTE DEVICE CHECK
Date Time Interrogation Session: 20181110170930
MDC IDC PG IMPLANT DT: 20170118

## 2017-03-16 ENCOUNTER — Telehealth: Payer: Self-pay | Admitting: Cardiology

## 2017-03-16 NOTE — Telephone Encounter (Signed)
LMOVM requesting that pt send manual transmission b/c home monitor has not updated in at least 14 days.    

## 2017-03-21 ENCOUNTER — Ambulatory Visit (INDEPENDENT_AMBULATORY_CARE_PROVIDER_SITE_OTHER): Payer: Self-pay | Admitting: *Deleted

## 2017-03-21 DIAGNOSIS — I63512 Cerebral infarction due to unspecified occlusion or stenosis of left middle cerebral artery: Secondary | ICD-10-CM

## 2017-03-22 NOTE — Progress Notes (Signed)
[facility] Summary Report / Loop Recorder 

## 2017-03-28 LAB — CUP PACEART REMOTE DEVICE CHECK
Implantable Pulse Generator Implant Date: 20170118
MDC IDC SESS DTM: 20181210173859

## 2017-04-20 ENCOUNTER — Ambulatory Visit (INDEPENDENT_AMBULATORY_CARE_PROVIDER_SITE_OTHER): Payer: Self-pay | Admitting: *Deleted

## 2017-04-20 DIAGNOSIS — I63512 Cerebral infarction due to unspecified occlusion or stenosis of left middle cerebral artery: Secondary | ICD-10-CM

## 2017-04-21 NOTE — Progress Notes (Signed)
[facility] Summary Report / Loop Recorder 

## 2017-05-02 LAB — CUP PACEART REMOTE DEVICE CHECK
Implantable Pulse Generator Implant Date: 20170118
MDC IDC SESS DTM: 20190109194012

## 2017-05-20 ENCOUNTER — Ambulatory Visit (INDEPENDENT_AMBULATORY_CARE_PROVIDER_SITE_OTHER): Payer: Self-pay | Admitting: *Deleted

## 2017-05-20 DIAGNOSIS — I639 Cerebral infarction, unspecified: Secondary | ICD-10-CM

## 2017-05-23 NOTE — Progress Notes (Signed)
[facility] Summary Report / Loop Recorder 

## 2017-06-14 LAB — CUP PACEART REMOTE DEVICE CHECK
Date Time Interrogation Session: 20190208211005
MDC IDC PG IMPLANT DT: 20170118

## 2017-06-22 ENCOUNTER — Ambulatory Visit (INDEPENDENT_AMBULATORY_CARE_PROVIDER_SITE_OTHER): Payer: Self-pay | Admitting: *Deleted

## 2017-06-22 DIAGNOSIS — I639 Cerebral infarction, unspecified: Secondary | ICD-10-CM

## 2017-06-23 NOTE — Progress Notes (Signed)
[facility] Summary Report / Loop Recorder 

## 2017-07-25 ENCOUNTER — Ambulatory Visit (INDEPENDENT_AMBULATORY_CARE_PROVIDER_SITE_OTHER): Payer: Self-pay | Admitting: *Deleted

## 2017-07-25 DIAGNOSIS — I639 Cerebral infarction, unspecified: Secondary | ICD-10-CM

## 2017-07-26 NOTE — Progress Notes (Signed)
[facility] Summary Report / Loop Recorder 

## 2017-08-01 LAB — CUP PACEART REMOTE DEVICE CHECK
Date Time Interrogation Session: 20190313213904
MDC IDC PG IMPLANT DT: 20170118

## 2017-08-24 LAB — CUP PACEART REMOTE DEVICE CHECK
Date Time Interrogation Session: 20190415220516
MDC IDC PG IMPLANT DT: 20170118

## 2017-08-29 ENCOUNTER — Ambulatory Visit (INDEPENDENT_AMBULATORY_CARE_PROVIDER_SITE_OTHER): Payer: Self-pay | Admitting: *Deleted

## 2017-08-29 DIAGNOSIS — I63512 Cerebral infarction due to unspecified occlusion or stenosis of left middle cerebral artery: Secondary | ICD-10-CM

## 2017-08-29 NOTE — Progress Notes (Signed)
[facility] Summary Report / Loop Recorder 

## 2017-09-14 ENCOUNTER — Telehealth: Payer: Self-pay | Admitting: Cardiology

## 2017-09-14 NOTE — Telephone Encounter (Signed)
LMOVM requesting that pt send manual transmission b/c home monitor has not updated in at least 14 days.    

## 2017-09-20 ENCOUNTER — Encounter: Payer: Self-pay | Admitting: Cardiology

## 2017-09-20 LAB — CUP PACEART REMOTE DEVICE CHECK
Date Time Interrogation Session: 20190518223518
MDC IDC PG IMPLANT DT: 20170118

## 2017-09-25 IMAGING — MR MR HEAD W/O CM
8 of 9 series · 31 of 48 positions shown · non-contrast
Comparison: CT head, CT angio head neck, earlier today.

CLINICAL DATA: RIGHT-sided weakness.  Unknown last seen normal.

EXAM:
MRI HEAD WITHOUT CONTRAST
MRA HEAD WITHOUT CONTRAST
TECHNIQUE: Multiplanar, multiecho pulse sequences of the brain and surrounding
structures were obtained without intravenous contrast. Angiographic
images of the head were obtained using MRA technique without
contrast.

[Series 3: DWI · axial · 3.6mm · 0.94mm/px · z∈[-107,+50]mm · 8 of 90 slices shown (1 of 4)]
[im 1/90]
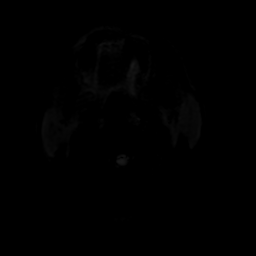
[im 13/90]
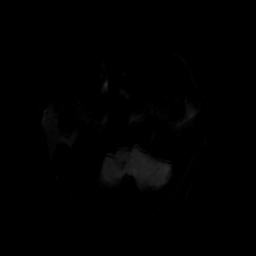
[im 26/90]
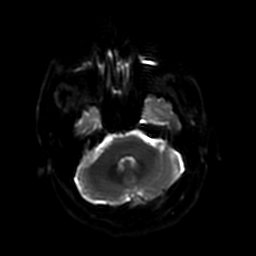
[im 39/90]
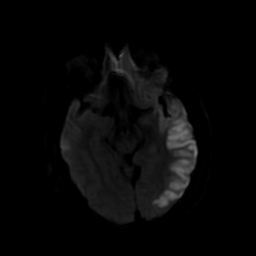
[im 51/90]
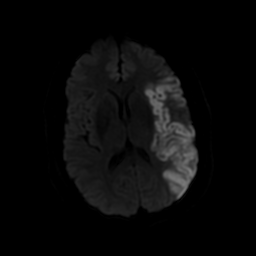
[im 64/90]
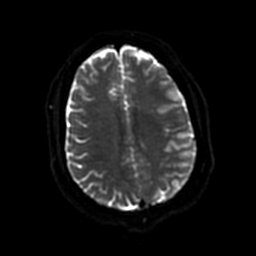
[im 77/90]
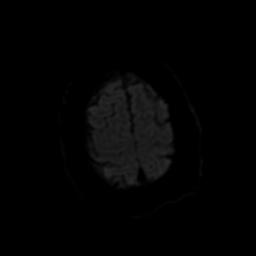
[im 90/90]
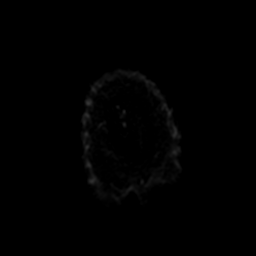

[Series 4: DWI · coronal · 5.0mm · 0.94mm/px · 5 of 72 slices shown (2 of 4)]
[im 1/72]
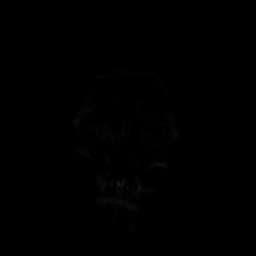
[im 18/72]
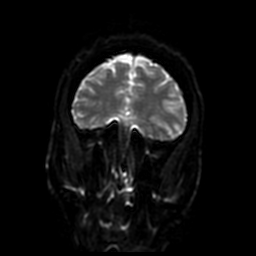
[im 36/72]
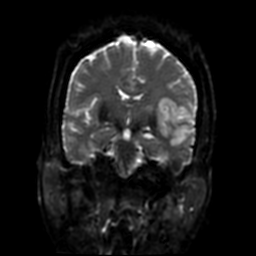
[im 54/72]
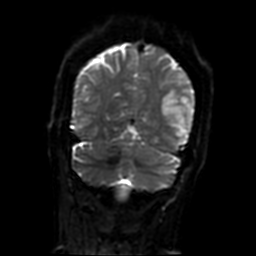
[im 72/72]
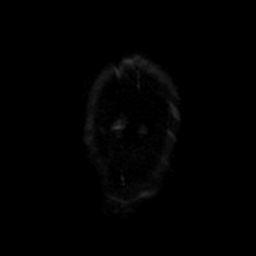

[Series 5: ax (id) 2 · axial · 1.2mm · 0.43mm/px · z∈[-100,-15]mm · 6 of 200 slices shown]
[im 1/200]
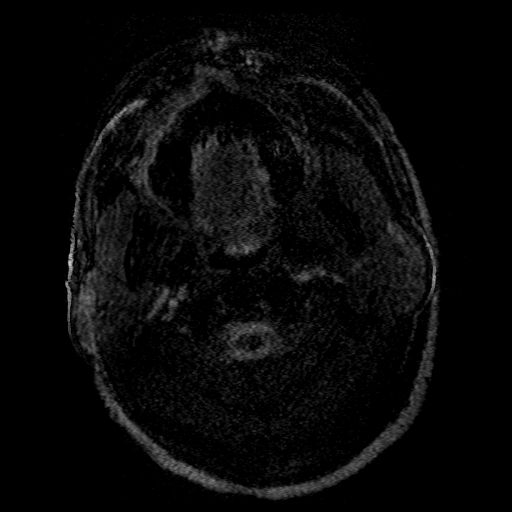
[im 29/200]
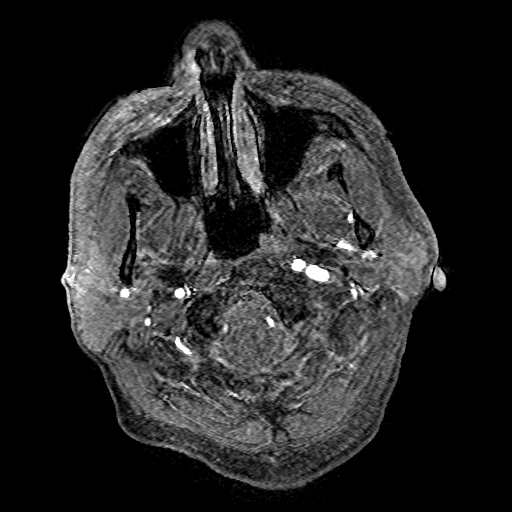
[im 57/200]
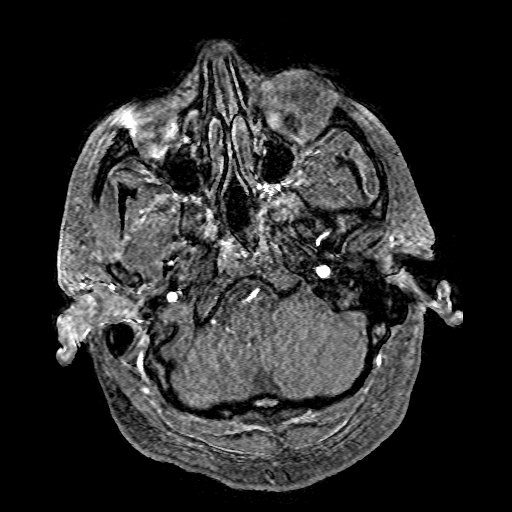
[im 86/200]
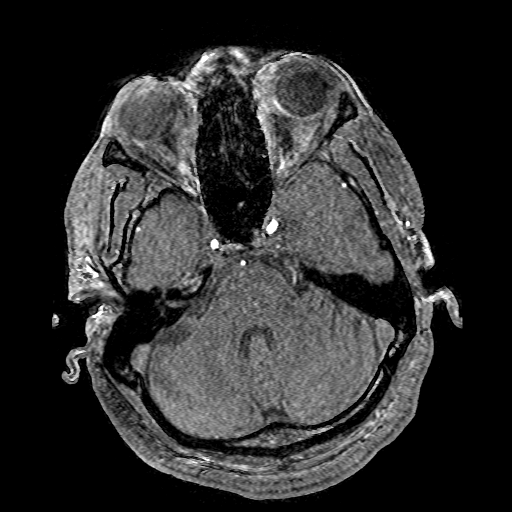
[im 114/200]
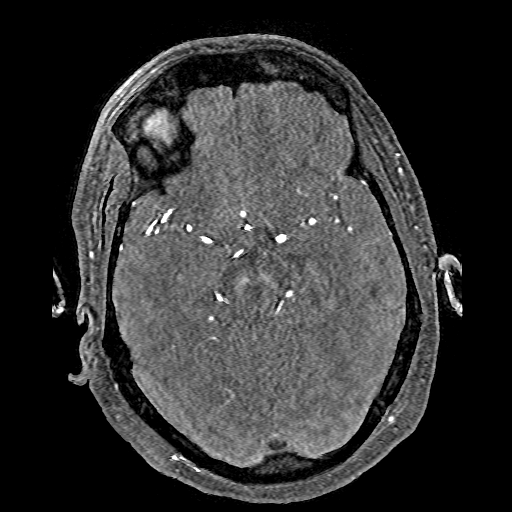
[im 143/200]
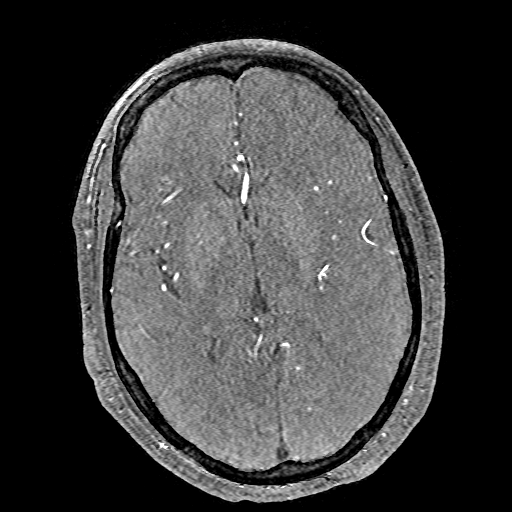

[Series 6: FLAIR · sagittal · 5.0mm · 0.47mm/px · 2 of 24 slices shown (1 of 2)]
[im 1/24]
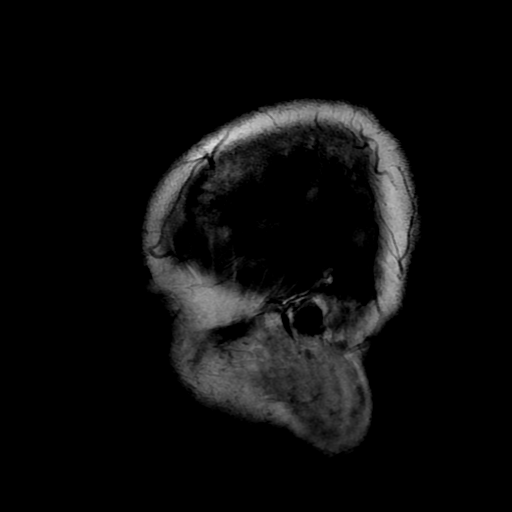
[im 24/24]
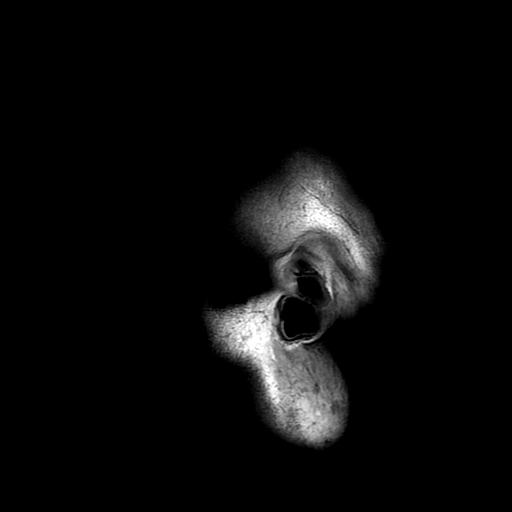

[Series 7: T2 · axial · 5.0mm · 0.47mm/px · z∈[-107,+60]mm · 2 of 29 slices shown]
[im 1/29]
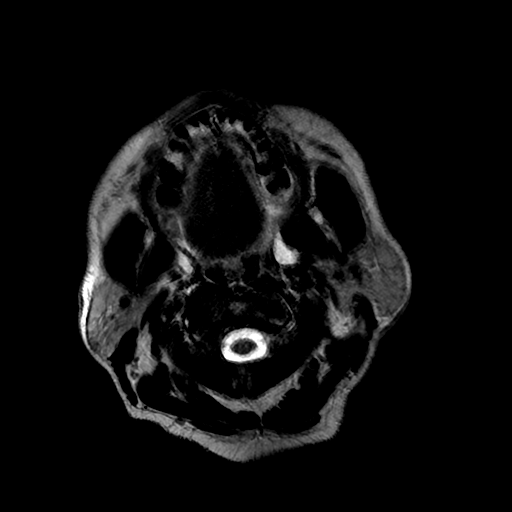
[im 29/29]
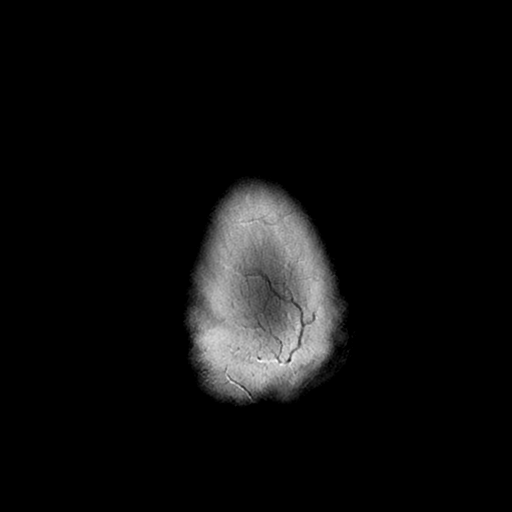

[Series 8: FLAIR · axial · 5.0mm · 0.47mm/px · z∈[-103,+58]mm · 2 of 28 slices shown (2 of 2)]
[im 1/28]
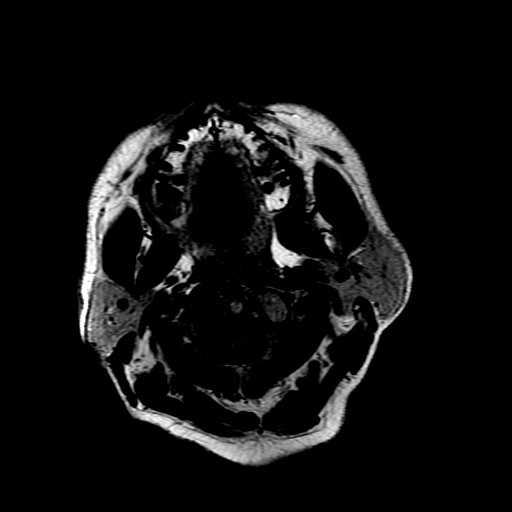
[im 28/28]
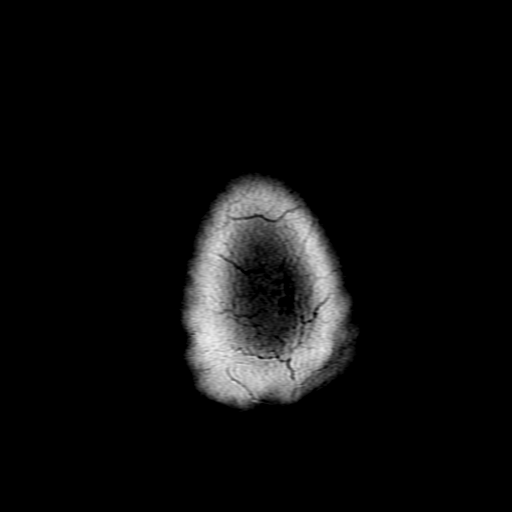

[Series 300: DWI · axial · 3.6mm · 0.94mm/px · z∈[-107,+50]mm · 3 of 45 slices shown (3 of 4)]
[im 1/45]
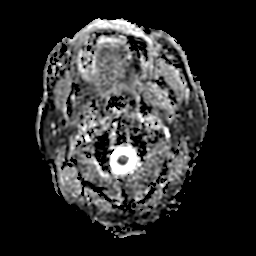
[im 23/45]
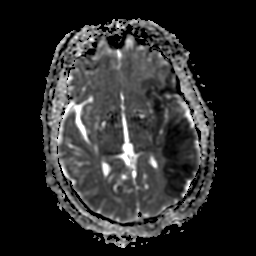
[im 45/45]
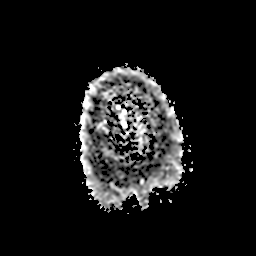

[Series 400: DWI · coronal · 5.0mm · 0.94mm/px · 3 of 36 slices shown (4 of 4)]
[im 1/36]
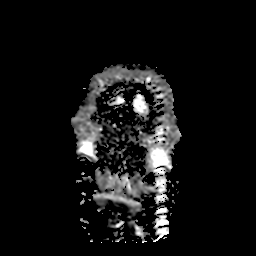
[im 18/36]
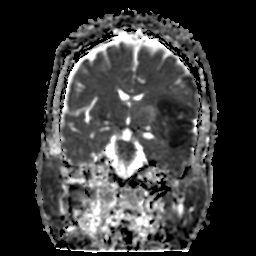
[im 36/36]
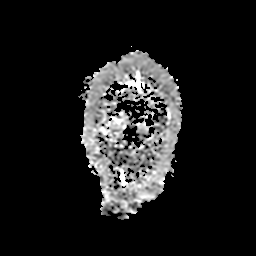

[31 of 48 positions shown; findings below may reference images not displayed]

FINDINGS: MRI HEAD FINDINGS

The patient was unable to remain motionless for the exam. Small or
subtle lesions could be overlooked.

Extensive gyriform restricted diffusion throughout much of the LEFT
MCA territory, including frontal, temporal, insular, and parietal
lobes, as well as adjacent subcortical white matter. No definite
hemorrhagic transformation.

Generalized atrophy premature for age. Minor white matter disease,
nonspecific. No midline abnormalities. Flow voids are maintained. No
extracranial soft tissue abnormalities of significance.

MRA HEAD FINDINGS

The internal carotid arteries are widely patent. The RIGHT anterior
cerebral artery is severely diseased, and both anterior cerebral is
are predominantly supplied from the LEFT.

The RIGHT MCA trunk and branches are normal.

On the LEFT, distal M1 segment, there appears to be a flow reducing
lesion. Patient motion degrades the axial source images. It is not
clear from interrogation of those axial source images, as well as
correlation with CTA, if this represents a severe stenosis or
intraluminal filling defect. See for instance image 14 series 501,
as well as image 98, series 5. Decreased size and number of distal
LEFT MCA branches; the distal M1 lesion is nonocclusive.

The basilar artery is supplied predominantly by the LEFT vertebral.
BILATERAL fetal PCA segments. Mid basilar stenosis approaches 50%.
IMPRESSION: Extensive areas of acute nonhemorrhagic infarction throughout much
of the LEFT MCA territory, with sparing of the basal ganglia.

No proximal large vessel occlusion, but there is either a focal
high-grade stenosis or intraluminal filling defect in the LEFT MCA,
distal M1 segment. This is not well seen on CTA.

Mid basilar stenosis estimated 50%, along with basilar hypoplasia
due to fetal PCA segments. Dominant LEFT vertebral.

## 2017-09-29 ENCOUNTER — Ambulatory Visit: Payer: Self-pay | Admitting: *Deleted

## 2017-09-29 DIAGNOSIS — I63512 Cerebral infarction due to unspecified occlusion or stenosis of left middle cerebral artery: Secondary | ICD-10-CM

## 2017-09-30 NOTE — Progress Notes (Signed)
[facility] Summary Report / Loop Recorder 

## 2017-11-01 ENCOUNTER — Ambulatory Visit (INDEPENDENT_AMBULATORY_CARE_PROVIDER_SITE_OTHER): Payer: Self-pay | Admitting: *Deleted

## 2017-11-01 DIAGNOSIS — I63512 Cerebral infarction due to unspecified occlusion or stenosis of left middle cerebral artery: Secondary | ICD-10-CM

## 2017-11-02 NOTE — Progress Notes (Signed)
[facility] Summary Report / Loop Recorder 

## 2017-12-05 ENCOUNTER — Ambulatory Visit (INDEPENDENT_AMBULATORY_CARE_PROVIDER_SITE_OTHER): Payer: Self-pay | Admitting: *Deleted

## 2017-12-05 DIAGNOSIS — I63512 Cerebral infarction due to unspecified occlusion or stenosis of left middle cerebral artery: Secondary | ICD-10-CM

## 2017-12-05 NOTE — Progress Notes (Signed)
[facility] Summary Report / Loop Recorder 

## 2017-12-16 LAB — CUP PACEART REMOTE DEVICE CHECK
Date Time Interrogation Session: 20190723233511
MDC IDC PG IMPLANT DT: 20170118

## 2018-01-03 LAB — CUP PACEART REMOTE DEVICE CHECK
Date Time Interrogation Session: 20190825234106
MDC IDC PG IMPLANT DT: 20170118

## 2018-01-06 ENCOUNTER — Ambulatory Visit (INDEPENDENT_AMBULATORY_CARE_PROVIDER_SITE_OTHER): Payer: Self-pay | Admitting: *Deleted

## 2018-01-06 DIAGNOSIS — I63512 Cerebral infarction due to unspecified occlusion or stenosis of left middle cerebral artery: Secondary | ICD-10-CM

## 2018-01-08 NOTE — Progress Notes (Signed)
[facility] Summary Report / Loop Recorder 

## 2018-01-09 LAB — CUP PACEART REMOTE DEVICE CHECK
Implantable Pulse Generator Implant Date: 20170118
MDC IDC SESS DTM: 20190927233930

## 2018-02-08 ENCOUNTER — Ambulatory Visit (INDEPENDENT_AMBULATORY_CARE_PROVIDER_SITE_OTHER): Payer: Self-pay | Admitting: *Deleted

## 2018-02-08 DIAGNOSIS — I63512 Cerebral infarction due to unspecified occlusion or stenosis of left middle cerebral artery: Secondary | ICD-10-CM

## 2018-02-10 NOTE — Progress Notes (Signed)
[facility] Summary Report / Loop Recorder 

## 2018-03-03 LAB — CUP PACEART REMOTE DEVICE CHECK
Date Time Interrogation Session: 20191031000949
Implantable Pulse Generator Implant Date: 20170118

## 2018-03-13 ENCOUNTER — Ambulatory Visit (INDEPENDENT_AMBULATORY_CARE_PROVIDER_SITE_OTHER): Payer: Self-pay

## 2018-03-13 DIAGNOSIS — I63512 Cerebral infarction due to unspecified occlusion or stenosis of left middle cerebral artery: Secondary | ICD-10-CM

## 2018-03-14 NOTE — Progress Notes (Signed)
[facility] Summary Report / Loop Recorder 

## 2018-03-21 ENCOUNTER — Telehealth: Payer: Self-pay | Admitting: Cardiology

## 2018-03-21 NOTE — Telephone Encounter (Signed)
LMOVM requesting that pt send manual transmission b/c home monitor has not updated in at least 14 days.    

## 2018-03-28 ENCOUNTER — Telehealth: Payer: Self-pay

## 2018-03-28 ENCOUNTER — Encounter: Payer: Self-pay | Admitting: Cardiology

## 2018-03-28 NOTE — Telephone Encounter (Signed)
LMOVM requesting that pt send manual transmission b/c home monitor has not updated in at least 14 days.    

## 2018-04-07 LAB — CUP PACEART REMOTE DEVICE CHECK
Date Time Interrogation Session: 20191203010800
MDC IDC PG IMPLANT DT: 20170118
